# Patient Record
Sex: Female | Born: 1980 | Race: White | Hispanic: No | Marital: Married | Smoking: Never smoker
Health system: Southern US, Community
[De-identification: ages and names within clinical notes are randomized; demographics above are authoritative.]

## PROBLEM LIST (undated history)

## (undated) DIAGNOSIS — D509 Iron deficiency anemia, unspecified: Secondary | ICD-10-CM

## (undated) DIAGNOSIS — T7840XA Allergy, unspecified, initial encounter: Secondary | ICD-10-CM

## (undated) DIAGNOSIS — R011 Cardiac murmur, unspecified: Secondary | ICD-10-CM

## (undated) DIAGNOSIS — D649 Anemia, unspecified: Secondary | ICD-10-CM

## (undated) DIAGNOSIS — E559 Vitamin D deficiency, unspecified: Secondary | ICD-10-CM

## (undated) DIAGNOSIS — B354 Tinea corporis: Secondary | ICD-10-CM

## (undated) DIAGNOSIS — I10 Essential (primary) hypertension: Secondary | ICD-10-CM

## (undated) DIAGNOSIS — K219 Gastro-esophageal reflux disease without esophagitis: Secondary | ICD-10-CM

## (undated) DIAGNOSIS — F419 Anxiety disorder, unspecified: Secondary | ICD-10-CM

## (undated) DIAGNOSIS — J309 Allergic rhinitis, unspecified: Secondary | ICD-10-CM

## (undated) DIAGNOSIS — B019 Varicella without complication: Secondary | ICD-10-CM

## (undated) DIAGNOSIS — R519 Headache, unspecified: Secondary | ICD-10-CM

## (undated) HISTORY — DX: Vitamin D deficiency, unspecified: E55.9

## (undated) HISTORY — DX: Gastro-esophageal reflux disease without esophagitis: K21.9

## (undated) HISTORY — DX: Iron deficiency anemia, unspecified: D50.9

## (undated) HISTORY — DX: Varicella without complication: B01.9

## (undated) HISTORY — DX: Allergic rhinitis, unspecified: J30.9

## (undated) HISTORY — DX: Headache, unspecified: R51.9

## (undated) HISTORY — DX: Allergy, unspecified, initial encounter: T78.40XA

## (undated) HISTORY — PX: BREAST BIOPSY: SHX20

## (undated) HISTORY — DX: Anxiety disorder, unspecified: F41.9

## (undated) HISTORY — DX: Tinea corporis: B35.4

## (undated) HISTORY — DX: Cardiac murmur, unspecified: R01.1

## (undated) HISTORY — DX: Anemia, unspecified: D64.9

## (undated) HISTORY — PX: WISDOM TOOTH EXTRACTION: SHX21

## (undated) HISTORY — DX: Essential (primary) hypertension: I10

---

## 2008-11-18 DIAGNOSIS — K921 Melena: Secondary | ICD-10-CM

## 2008-11-18 HISTORY — DX: Melena: K92.1

## 2008-11-18 LAB — HM COLONOSCOPY

## 2013-11-18 DIAGNOSIS — N926 Irregular menstruation, unspecified: Secondary | ICD-10-CM

## 2013-11-18 HISTORY — DX: Irregular menstruation, unspecified: N92.6

## 2014-06-15 ENCOUNTER — Other Ambulatory Visit: Payer: Self-pay | Admitting: Family Medicine

## 2014-06-15 DIAGNOSIS — Z975 Presence of (intrauterine) contraceptive device: Secondary | ICD-10-CM

## 2014-06-17 ENCOUNTER — Ambulatory Visit
Admission: RE | Admit: 2014-06-17 | Discharge: 2014-06-17 | Disposition: A | Discharge status: Home / Self Care | Payer: Federal, State, Local not specified - PPO | Source: Ambulatory Visit | Attending: Family Medicine | Admitting: Family Medicine

## 2014-06-17 DIAGNOSIS — Z975 Presence of (intrauterine) contraceptive device: Secondary | ICD-10-CM

## 2014-06-28 LAB — HM PAP SMEAR

## 2014-11-18 DIAGNOSIS — J358 Other chronic diseases of tonsils and adenoids: Secondary | ICD-10-CM

## 2014-11-18 DIAGNOSIS — M179 Osteoarthritis of knee, unspecified: Secondary | ICD-10-CM

## 2014-11-18 DIAGNOSIS — M1710 Unilateral primary osteoarthritis, unspecified knee: Secondary | ICD-10-CM

## 2014-11-18 HISTORY — DX: Other chronic diseases of tonsils and adenoids: J35.8

## 2014-11-18 HISTORY — DX: Unilateral primary osteoarthritis, unspecified knee: M17.10

## 2014-11-18 HISTORY — DX: Osteoarthritis of knee, unspecified: M17.9

## 2015-04-12 ENCOUNTER — Other Ambulatory Visit: Payer: Self-pay | Admitting: Family Medicine

## 2015-04-12 DIAGNOSIS — E041 Nontoxic single thyroid nodule: Secondary | ICD-10-CM

## 2015-04-19 DIAGNOSIS — M222X9 Patellofemoral disorders, unspecified knee: Secondary | ICD-10-CM

## 2015-04-19 HISTORY — DX: Patellofemoral disorders, unspecified knee: M22.2X9

## 2015-05-01 ENCOUNTER — Ambulatory Visit
Admission: RE | Admit: 2015-05-01 | Discharge: 2015-05-01 | Disposition: A | Discharge status: Home / Self Care | Payer: Federal, State, Local not specified - PPO | Source: Ambulatory Visit | Attending: Family Medicine | Admitting: Family Medicine

## 2015-05-01 DIAGNOSIS — E041 Nontoxic single thyroid nodule: Secondary | ICD-10-CM

## 2015-08-11 ENCOUNTER — Other Ambulatory Visit: Payer: Self-pay | Admitting: Family Medicine

## 2015-08-11 DIAGNOSIS — N6320 Unspecified lump in the left breast, unspecified quadrant: Secondary | ICD-10-CM

## 2015-08-11 LAB — HM PAP SMEAR

## 2015-08-14 ENCOUNTER — Other Ambulatory Visit: Payer: Self-pay | Admitting: Family Medicine

## 2015-08-14 DIAGNOSIS — N6320 Unspecified lump in the left breast, unspecified quadrant: Secondary | ICD-10-CM

## 2015-08-23 ENCOUNTER — Ambulatory Visit
Admission: RE | Admit: 2015-08-23 | Discharge: 2015-08-23 | Disposition: A | Discharge status: Home / Self Care | Payer: Federal, State, Local not specified - PPO | Source: Ambulatory Visit | Attending: Family Medicine | Admitting: Family Medicine

## 2015-08-23 DIAGNOSIS — N6320 Unspecified lump in the left breast, unspecified quadrant: Secondary | ICD-10-CM

## 2016-01-22 ENCOUNTER — Other Ambulatory Visit: Payer: Self-pay | Admitting: Family Medicine

## 2016-01-22 DIAGNOSIS — N6320 Unspecified lump in the left breast, unspecified quadrant: Secondary | ICD-10-CM

## 2016-01-22 DIAGNOSIS — N63 Unspecified lump in unspecified breast: Secondary | ICD-10-CM

## 2016-02-23 ENCOUNTER — Ambulatory Visit
Admission: RE | Admit: 2016-02-23 | Discharge: 2016-02-23 | Disposition: A | Discharge status: Home / Self Care | Payer: Federal, State, Local not specified - PPO | Source: Ambulatory Visit | Attending: Family Medicine | Admitting: Family Medicine

## 2016-02-23 DIAGNOSIS — N63 Unspecified lump in unspecified breast: Secondary | ICD-10-CM

## 2016-02-23 DIAGNOSIS — N6320 Unspecified lump in the left breast, unspecified quadrant: Secondary | ICD-10-CM

## 2016-08-13 DIAGNOSIS — Z136 Encounter for screening for cardiovascular disorders: Secondary | ICD-10-CM | POA: Diagnosis not present

## 2016-08-13 DIAGNOSIS — E559 Vitamin D deficiency, unspecified: Secondary | ICD-10-CM | POA: Diagnosis not present

## 2016-08-13 DIAGNOSIS — Z0000 Encounter for general adult medical examination without abnormal findings: Secondary | ICD-10-CM | POA: Diagnosis not present

## 2016-08-15 DIAGNOSIS — Z23 Encounter for immunization: Secondary | ICD-10-CM | POA: Diagnosis not present

## 2016-08-15 DIAGNOSIS — Z0000 Encounter for general adult medical examination without abnormal findings: Secondary | ICD-10-CM | POA: Diagnosis not present

## 2016-08-18 HISTORY — PX: BREAST BIOPSY: SHX20

## 2016-08-28 ENCOUNTER — Other Ambulatory Visit: Payer: Self-pay | Admitting: Family Medicine

## 2016-08-28 DIAGNOSIS — N6320 Unspecified lump in the left breast, unspecified quadrant: Secondary | ICD-10-CM

## 2016-08-28 DIAGNOSIS — R921 Mammographic calcification found on diagnostic imaging of breast: Secondary | ICD-10-CM

## 2016-09-06 ENCOUNTER — Other Ambulatory Visit: Payer: Self-pay | Admitting: Family Medicine

## 2016-09-06 ENCOUNTER — Ambulatory Visit
Admission: RE | Admit: 2016-09-06 | Discharge: 2016-09-06 | Disposition: A | Discharge status: Home / Self Care | Payer: Federal, State, Local not specified - PPO | Source: Ambulatory Visit | Attending: Family Medicine | Admitting: Family Medicine

## 2016-09-06 DIAGNOSIS — N6320 Unspecified lump in the left breast, unspecified quadrant: Secondary | ICD-10-CM

## 2016-09-06 DIAGNOSIS — R921 Mammographic calcification found on diagnostic imaging of breast: Secondary | ICD-10-CM

## 2016-09-09 ENCOUNTER — Ambulatory Visit
Admission: RE | Admit: 2016-09-09 | Discharge: 2016-09-09 | Disposition: A | Discharge status: Home / Self Care | Payer: Federal, State, Local not specified - PPO | Source: Ambulatory Visit | Attending: Family Medicine | Admitting: Family Medicine

## 2016-09-09 ENCOUNTER — Other Ambulatory Visit: Payer: Self-pay | Admitting: Family Medicine

## 2016-09-09 DIAGNOSIS — N6320 Unspecified lump in the left breast, unspecified quadrant: Secondary | ICD-10-CM

## 2016-09-09 DIAGNOSIS — D242 Benign neoplasm of left breast: Secondary | ICD-10-CM | POA: Diagnosis not present

## 2016-11-28 DIAGNOSIS — K080 Exfoliation of teeth due to systemic causes: Secondary | ICD-10-CM | POA: Diagnosis not present

## 2017-02-11 DIAGNOSIS — Z23 Encounter for immunization: Secondary | ICD-10-CM | POA: Diagnosis not present

## 2017-05-29 DIAGNOSIS — K080 Exfoliation of teeth due to systemic causes: Secondary | ICD-10-CM | POA: Diagnosis not present

## 2017-07-22 ENCOUNTER — Other Ambulatory Visit: Payer: Self-pay | Admitting: Family Medicine

## 2017-07-22 DIAGNOSIS — R921 Mammographic calcification found on diagnostic imaging of breast: Secondary | ICD-10-CM

## 2017-08-12 DIAGNOSIS — Z0000 Encounter for general adult medical examination without abnormal findings: Secondary | ICD-10-CM | POA: Diagnosis not present

## 2017-08-12 DIAGNOSIS — E559 Vitamin D deficiency, unspecified: Secondary | ICD-10-CM | POA: Diagnosis not present

## 2017-08-12 DIAGNOSIS — Z1329 Encounter for screening for other suspected endocrine disorder: Secondary | ICD-10-CM | POA: Diagnosis not present

## 2017-08-12 DIAGNOSIS — Z114 Encounter for screening for human immunodeficiency virus [HIV]: Secondary | ICD-10-CM | POA: Diagnosis not present

## 2017-08-12 DIAGNOSIS — Z13220 Encounter for screening for lipoid disorders: Secondary | ICD-10-CM | POA: Diagnosis not present

## 2017-08-14 DIAGNOSIS — Z0000 Encounter for general adult medical examination without abnormal findings: Secondary | ICD-10-CM | POA: Diagnosis not present

## 2017-08-14 DIAGNOSIS — D249 Benign neoplasm of unspecified breast: Secondary | ICD-10-CM

## 2017-08-14 DIAGNOSIS — Z23 Encounter for immunization: Secondary | ICD-10-CM | POA: Diagnosis not present

## 2017-08-14 DIAGNOSIS — Z6832 Body mass index (BMI) 32.0-32.9, adult: Secondary | ICD-10-CM | POA: Diagnosis not present

## 2017-08-14 HISTORY — DX: Benign neoplasm of unspecified breast: D24.9

## 2017-08-15 DIAGNOSIS — N760 Acute vaginitis: Secondary | ICD-10-CM | POA: Diagnosis not present

## 2017-08-15 DIAGNOSIS — Z118 Encounter for screening for other infectious and parasitic diseases: Secondary | ICD-10-CM | POA: Diagnosis not present

## 2017-08-15 DIAGNOSIS — Z01419 Encounter for gynecological examination (general) (routine) without abnormal findings: Secondary | ICD-10-CM | POA: Diagnosis not present

## 2017-09-08 ENCOUNTER — Ambulatory Visit
Admission: RE | Admit: 2017-09-08 | Discharge: 2017-09-08 | Disposition: A | Discharge status: Home / Self Care | Payer: Federal, State, Local not specified - PPO | Source: Ambulatory Visit | Attending: Family Medicine | Admitting: Family Medicine

## 2017-09-08 ENCOUNTER — Encounter: Payer: Self-pay | Admitting: Radiology

## 2017-09-08 DIAGNOSIS — R921 Mammographic calcification found on diagnostic imaging of breast: Secondary | ICD-10-CM

## 2017-12-10 DIAGNOSIS — K080 Exfoliation of teeth due to systemic causes: Secondary | ICD-10-CM | POA: Diagnosis not present

## 2018-05-18 DIAGNOSIS — Z6832 Body mass index (BMI) 32.0-32.9, adult: Secondary | ICD-10-CM | POA: Diagnosis not present

## 2018-05-18 DIAGNOSIS — I10 Essential (primary) hypertension: Secondary | ICD-10-CM | POA: Diagnosis not present

## 2018-06-11 DIAGNOSIS — K080 Exfoliation of teeth due to systemic causes: Secondary | ICD-10-CM | POA: Diagnosis not present

## 2018-06-17 DIAGNOSIS — D179 Benign lipomatous neoplasm, unspecified: Secondary | ICD-10-CM | POA: Diagnosis not present

## 2018-06-17 DIAGNOSIS — I10 Essential (primary) hypertension: Secondary | ICD-10-CM | POA: Diagnosis not present

## 2018-06-17 DIAGNOSIS — Z6832 Body mass index (BMI) 32.0-32.9, adult: Secondary | ICD-10-CM | POA: Diagnosis not present

## 2018-08-17 DIAGNOSIS — Z0000 Encounter for general adult medical examination without abnormal findings: Secondary | ICD-10-CM | POA: Diagnosis not present

## 2018-08-17 DIAGNOSIS — Z13220 Encounter for screening for lipoid disorders: Secondary | ICD-10-CM | POA: Diagnosis not present

## 2018-08-17 LAB — LIPID PANEL
Cholesterol: 176 (ref 0–200)
HDL: 48 (ref 35–70)
LDL Cholesterol: 98
Triglycerides: 154 (ref 40–160)

## 2018-08-18 LAB — TSH: TSH: 1.41 (ref 0.41–5.90)

## 2018-08-18 LAB — CBC AND DIFFERENTIAL: Hemoglobin: 11.7 — AB (ref 12.0–16.0)

## 2018-08-18 LAB — BASIC METABOLIC PANEL: Creatinine: 0.8 (ref 0.5–1.1)

## 2018-08-24 DIAGNOSIS — M542 Cervicalgia: Secondary | ICD-10-CM | POA: Diagnosis not present

## 2018-08-24 DIAGNOSIS — G8929 Other chronic pain: Secondary | ICD-10-CM | POA: Diagnosis not present

## 2018-08-24 DIAGNOSIS — Z6831 Body mass index (BMI) 31.0-31.9, adult: Secondary | ICD-10-CM | POA: Diagnosis not present

## 2018-08-24 DIAGNOSIS — Z0000 Encounter for general adult medical examination without abnormal findings: Secondary | ICD-10-CM | POA: Diagnosis not present

## 2018-08-24 DIAGNOSIS — Z23 Encounter for immunization: Secondary | ICD-10-CM | POA: Diagnosis not present

## 2018-11-30 ENCOUNTER — Other Ambulatory Visit: Payer: Self-pay | Admitting: Family Medicine

## 2018-11-30 DIAGNOSIS — Z1231 Encounter for screening mammogram for malignant neoplasm of breast: Secondary | ICD-10-CM

## 2018-12-01 DIAGNOSIS — M542 Cervicalgia: Secondary | ICD-10-CM | POA: Diagnosis not present

## 2018-12-01 DIAGNOSIS — Z6831 Body mass index (BMI) 31.0-31.9, adult: Secondary | ICD-10-CM | POA: Diagnosis not present

## 2018-12-01 DIAGNOSIS — G8929 Other chronic pain: Secondary | ICD-10-CM | POA: Diagnosis not present

## 2018-12-01 DIAGNOSIS — I10 Essential (primary) hypertension: Secondary | ICD-10-CM | POA: Diagnosis not present

## 2018-12-28 ENCOUNTER — Ambulatory Visit
Admission: RE | Admit: 2018-12-28 | Discharge: 2018-12-28 | Disposition: A | Discharge status: Home / Self Care | Payer: Federal, State, Local not specified - PPO | Source: Ambulatory Visit | Attending: Family Medicine | Admitting: Family Medicine

## 2018-12-28 ENCOUNTER — Ambulatory Visit: Payer: Federal, State, Local not specified - PPO

## 2018-12-28 DIAGNOSIS — Z1231 Encounter for screening mammogram for malignant neoplasm of breast: Secondary | ICD-10-CM | POA: Diagnosis not present

## 2018-12-29 DIAGNOSIS — I10 Essential (primary) hypertension: Secondary | ICD-10-CM | POA: Diagnosis not present

## 2018-12-29 DIAGNOSIS — E539 Vitamin B deficiency, unspecified: Secondary | ICD-10-CM | POA: Diagnosis not present

## 2019-01-02 LAB — TSH: TSH: 1.28 (ref <=5.90)

## 2019-02-18 DIAGNOSIS — Z719 Counseling, unspecified: Secondary | ICD-10-CM | POA: Diagnosis not present

## 2019-02-18 DIAGNOSIS — E559 Vitamin D deficiency, unspecified: Secondary | ICD-10-CM | POA: Diagnosis not present

## 2019-02-18 DIAGNOSIS — I10 Essential (primary) hypertension: Secondary | ICD-10-CM | POA: Diagnosis not present

## 2019-02-18 DIAGNOSIS — J309 Allergic rhinitis, unspecified: Secondary | ICD-10-CM | POA: Diagnosis not present

## 2019-08-30 DIAGNOSIS — Z0000 Encounter for general adult medical examination without abnormal findings: Secondary | ICD-10-CM | POA: Diagnosis not present

## 2019-08-30 DIAGNOSIS — E782 Mixed hyperlipidemia: Secondary | ICD-10-CM | POA: Diagnosis not present

## 2019-08-31 LAB — BASIC METABOLIC PANEL: Creatinine: 0.8 (ref 0.5–1.1)

## 2019-08-31 LAB — LIPID PANEL
Cholesterol: 206 — AB (ref 0–200)
HDL: 49 (ref 35–70)
LDL Cholesterol: 120
Triglycerides: 212 — AB (ref 40–160)

## 2019-08-31 LAB — HEPATIC FUNCTION PANEL
ALT: 29 (ref 7–35)
AST: 26 (ref 13–35)
Bilirubin, Total: 0.2

## 2019-08-31 LAB — CBC AND DIFFERENTIAL: Hemoglobin: 11.7 — AB (ref 12.0–16.0)

## 2019-09-07 DIAGNOSIS — Z0000 Encounter for general adult medical examination without abnormal findings: Secondary | ICD-10-CM | POA: Diagnosis not present

## 2019-09-07 DIAGNOSIS — Z23 Encounter for immunization: Secondary | ICD-10-CM | POA: Diagnosis not present

## 2019-09-07 DIAGNOSIS — B354 Tinea corporis: Secondary | ICD-10-CM | POA: Diagnosis not present

## 2019-09-07 DIAGNOSIS — L989 Disorder of the skin and subcutaneous tissue, unspecified: Secondary | ICD-10-CM | POA: Diagnosis not present

## 2019-10-11 DIAGNOSIS — D485 Neoplasm of uncertain behavior of skin: Secondary | ICD-10-CM | POA: Diagnosis not present

## 2019-10-11 DIAGNOSIS — L816 Other disorders of diminished melanin formation: Secondary | ICD-10-CM | POA: Diagnosis not present

## 2019-10-11 DIAGNOSIS — D2339 Other benign neoplasm of skin of other parts of face: Secondary | ICD-10-CM | POA: Diagnosis not present

## 2019-10-11 DIAGNOSIS — L821 Other seborrheic keratosis: Secondary | ICD-10-CM | POA: Diagnosis not present

## 2019-10-11 DIAGNOSIS — D225 Melanocytic nevi of trunk: Secondary | ICD-10-CM | POA: Diagnosis not present

## 2019-12-03 DIAGNOSIS — I10 Essential (primary) hypertension: Secondary | ICD-10-CM | POA: Diagnosis not present

## 2019-12-03 DIAGNOSIS — Q825 Congenital non-neoplastic nevus: Secondary | ICD-10-CM | POA: Diagnosis not present

## 2019-12-03 DIAGNOSIS — J309 Allergic rhinitis, unspecified: Secondary | ICD-10-CM | POA: Diagnosis not present

## 2020-01-03 ENCOUNTER — Other Ambulatory Visit: Payer: Self-pay

## 2020-01-03 ENCOUNTER — Other Ambulatory Visit: Payer: Self-pay | Admitting: Family Medicine

## 2020-01-03 ENCOUNTER — Ambulatory Visit
Admission: RE | Admit: 2020-01-03 | Discharge: 2020-01-03 | Disposition: A | Discharge status: Home / Self Care | Payer: Federal, State, Local not specified - PPO | Source: Ambulatory Visit

## 2020-01-03 DIAGNOSIS — Z1231 Encounter for screening mammogram for malignant neoplasm of breast: Secondary | ICD-10-CM

## 2020-01-04 ENCOUNTER — Other Ambulatory Visit: Payer: Self-pay | Admitting: Family Medicine

## 2020-01-04 DIAGNOSIS — N6310 Unspecified lump in the right breast, unspecified quadrant: Secondary | ICD-10-CM

## 2020-01-05 ENCOUNTER — Ambulatory Visit
Admission: RE | Admit: 2020-01-05 | Discharge: 2020-01-05 | Disposition: A | Discharge status: Home / Self Care | Payer: Federal, State, Local not specified - PPO | Source: Ambulatory Visit | Attending: Family Medicine | Admitting: Family Medicine

## 2020-01-05 ENCOUNTER — Other Ambulatory Visit: Payer: Self-pay

## 2020-01-05 DIAGNOSIS — R922 Inconclusive mammogram: Secondary | ICD-10-CM | POA: Diagnosis not present

## 2020-01-05 DIAGNOSIS — N6310 Unspecified lump in the right breast, unspecified quadrant: Secondary | ICD-10-CM

## 2020-01-05 DIAGNOSIS — N6311 Unspecified lump in the right breast, upper outer quadrant: Secondary | ICD-10-CM | POA: Diagnosis not present

## 2020-01-06 IMAGING — MG DIGITAL SCREENING BILATERAL MAMMOGRAM WITH TOMO AND CAD
8 series · 8 of 24 positions shown · non-contrast
Comparison: Previous exam(s).

CLINICAL DATA: Screening.

EXAM:
DIGITAL SCREENING BILATERAL MAMMOGRAM WITH TOMO AND CAD

[R CC synth-2D]
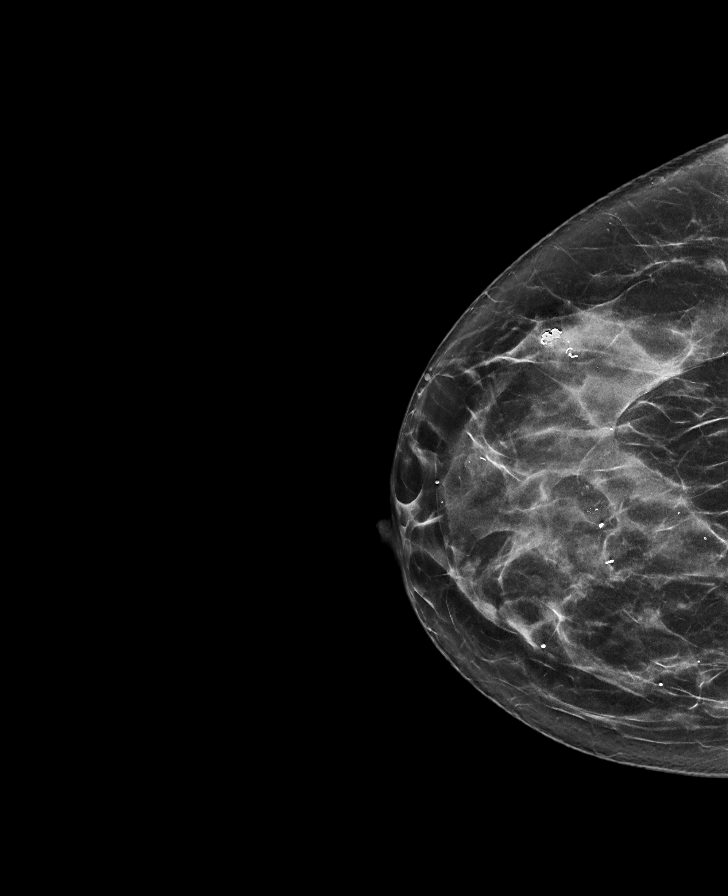

[R MLO synth-2D]
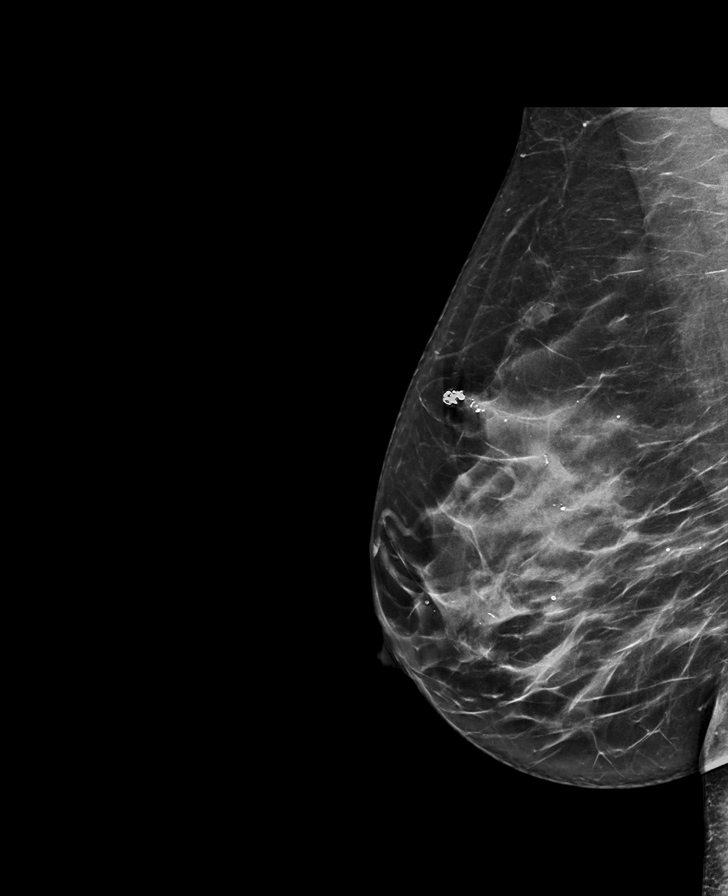

[L CC synth-2D]
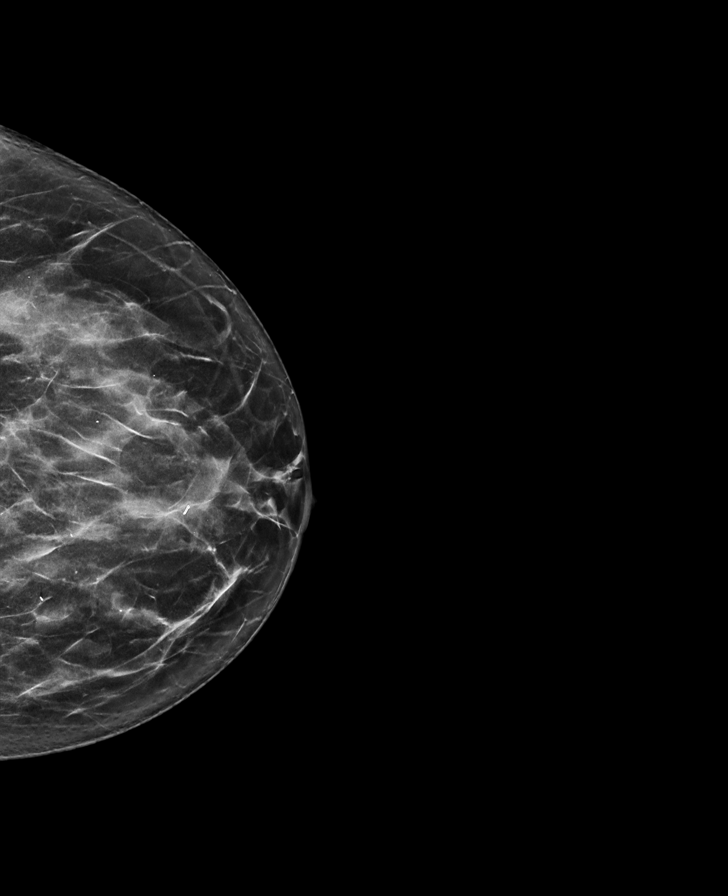

[L MLO synth-2D]
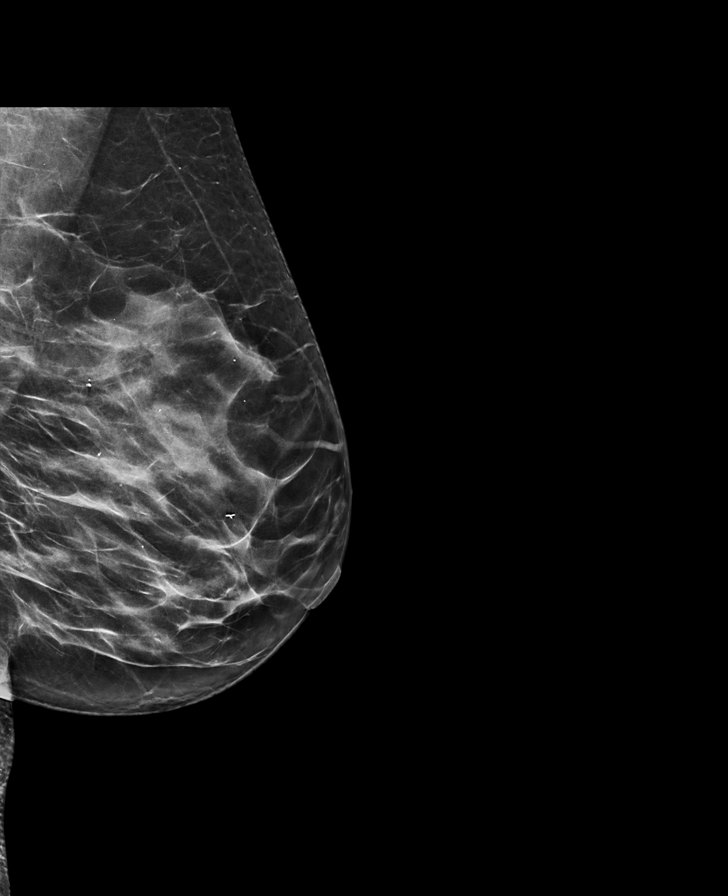

[L CC tomo · tomo slice 34/67.0]
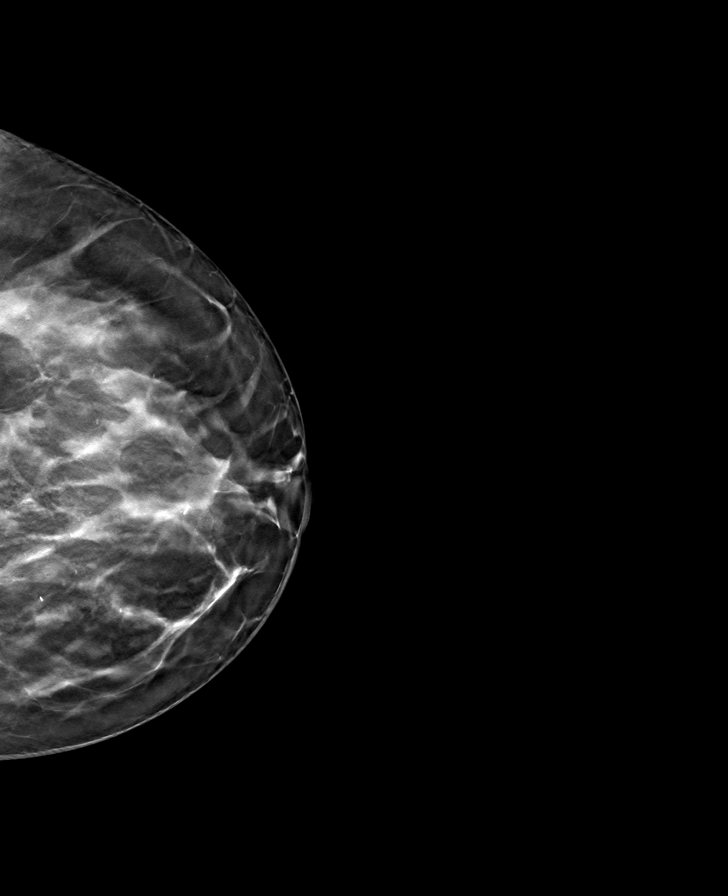

[R CC tomo · tomo slice 35/70.0]
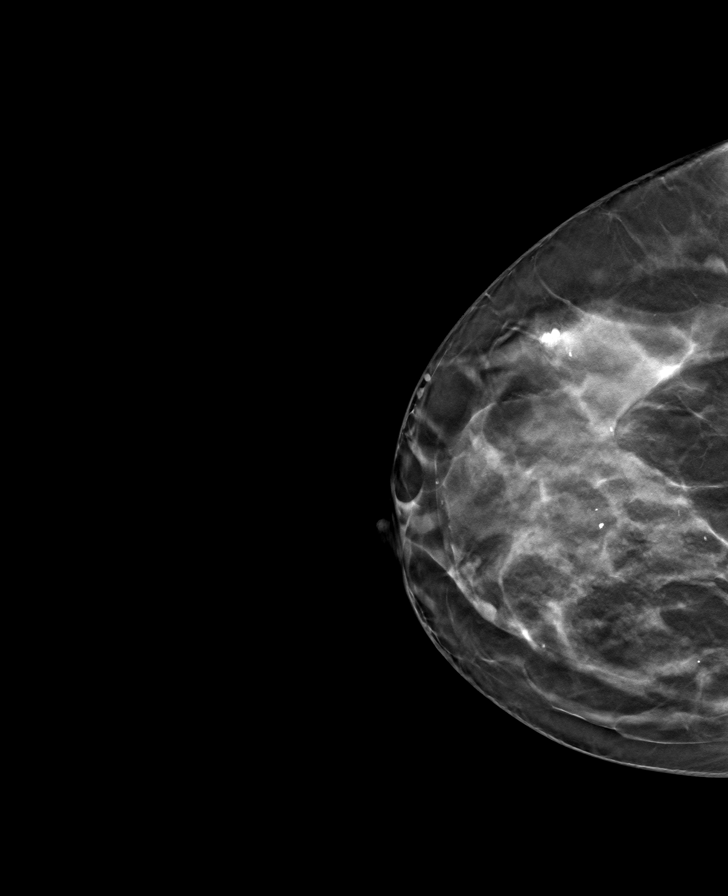

[R MLO tomo · tomo slice 39/78.0]
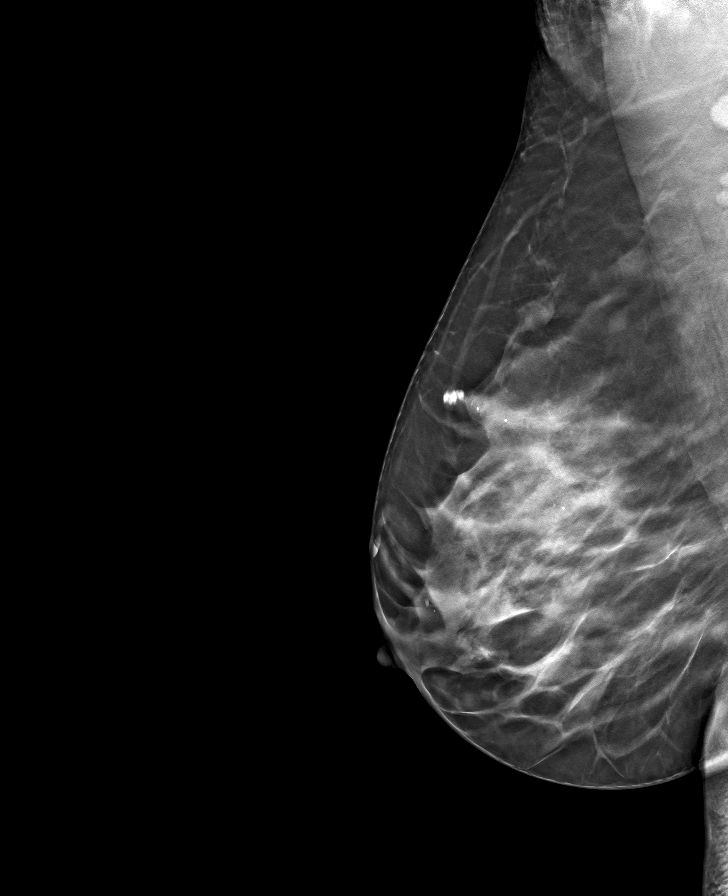

[L MLO tomo · tomo slice 38/75.0]
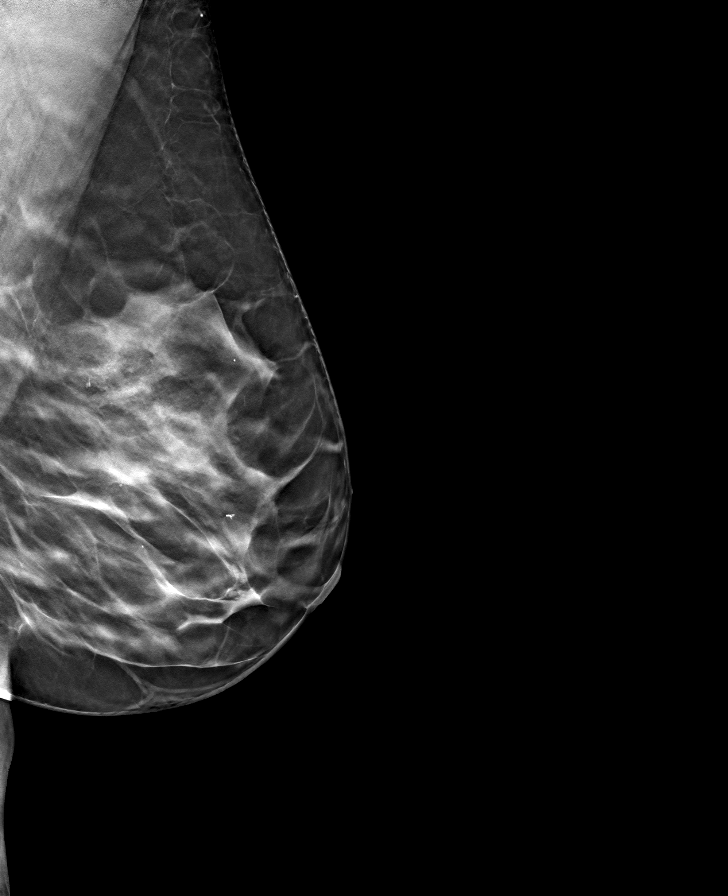

[8 of 24 positions shown; findings below may reference images not displayed]

ACR Breast Density Category c: The breast tissue is heterogeneously
dense, which may obscure small masses.
FINDINGS: There are no findings suspicious for malignancy. Images were
processed with CAD.
IMPRESSION: No mammographic evidence of malignancy. A result letter of this
screening mammogram will be mailed directly to the patient.

RECOMMENDATION:
Screening mammogram in one year. (Code:FT-U-LHB)

BI-RADS CATEGORY  1: Negative.

## 2020-02-21 ENCOUNTER — Encounter: Payer: Self-pay | Admitting: Family Medicine

## 2020-02-22 ENCOUNTER — Other Ambulatory Visit: Payer: Self-pay

## 2020-02-22 ENCOUNTER — Encounter: Payer: Self-pay | Admitting: Family Medicine

## 2020-02-22 ENCOUNTER — Ambulatory Visit: Payer: Federal, State, Local not specified - PPO | Admitting: Family Medicine

## 2020-02-22 VITALS — BP 122/84 | HR 80 | Temp 98.2°F | Resp 18 | Ht 61.5 in | Wt 171.2 lb

## 2020-02-22 DIAGNOSIS — Z7689 Persons encountering health services in other specified circumstances: Secondary | ICD-10-CM

## 2020-02-22 DIAGNOSIS — I10 Essential (primary) hypertension: Secondary | ICD-10-CM | POA: Diagnosis not present

## 2020-02-22 DIAGNOSIS — E669 Obesity, unspecified: Secondary | ICD-10-CM

## 2020-02-22 DIAGNOSIS — R202 Paresthesia of skin: Secondary | ICD-10-CM

## 2020-02-22 DIAGNOSIS — T7840XA Allergy, unspecified, initial encounter: Secondary | ICD-10-CM

## 2020-02-22 DIAGNOSIS — J302 Other seasonal allergic rhinitis: Secondary | ICD-10-CM

## 2020-02-22 DIAGNOSIS — R200 Anesthesia of skin: Secondary | ICD-10-CM

## 2020-02-22 MED ORDER — AMLODIPINE BESYLATE 10 MG PO TABS: 10.0000 mg | ORAL_TABLET | Freq: Every day | ORAL | 1 refills | Status: DC

## 2020-02-22 NOTE — Progress Notes (Signed)
Patient ID: Maria Goodwin, female  DOB: 10-27-1981, 39 y.o.   MRN: [id number] Patient Care Team    Relationship Specialty Notifications Start End  [name], DO PCP - General Family Medicine  02/22/20     Chief Complaint  Patient presents with  . Establish Care    Last PCP Dr Ernie Hew.   . Hypertension    Pt needs refill on medication     Subjective:  Maria Goodwin is a 39 y.o.  female present for new patient establishment. All past medical history, surgical history, allergies, family history, immunizations, medications and social history were updated in the electronic medical record today. All recent labs, ED visits and hospitalizations within the last year were reviewed.  Hypertension/obesity:  Pt reports compliance with amlodipine 10 mg daily. Blood pressures ranges at home routinely checked. Patient denies chest pain, shortness of breath or lower extremity edema. Pt is not taking a daily baby ASA. Pt is not prescribed a prescribed statin. BMP: 08/30/2019 within normal limits CBC: " TSH: 08/30/2019 within normal limits Lipids: 08/30/2019 RF: Hypertension, obesity, family history of hypertension, hyperlipidemia and stroke  Seasonal allergies/Allergic reaction, initial encounter Patient endorses having seasonal allergies yearly.  The end of last year she discussed with her primary care physician that she was having some mild shortness of breath.  Patient admits she did not know if it was allergies and even consider anxiety as a possible cause.  At that time she was started on Singulair.  Patient reports she was not taking an antihistamine or using any format of nasal sprays.  She states the Singulair might have helped a little, but she is uncertain and she has not taking this medication for a few months  Numbness and tingling of upper extremity Patient reports numbness and tingling of bilateral upper extremities on occasions upon awakening.  She reports she does sleep with her  arms folded underneath her routinely.  She endorses numbness and tingling resolves within a few minutes of awakening.  Depression screen [facility] 2/9 02/22/2020  Decreased Interest 0  Down, Depressed, Hopeless 0  PHQ - 2 Score 0   No flowsheet data found.     No flowsheet data found.  Immunization History  Administered Date(s) Administered  . Hepatitis A 08/15/2016, 02/11/2017  . Hepatitis B 01/31/1987  . Influenza-Unspecified 09/07/2019  . PFIZER SARS-COV-2 Vaccination 02/08/2020  . Tdap 08/15/2016    No exam data present  Past Medical History:  Diagnosis Date  . Allergy   . Blood in stool 2010   Patient reports hospitalization for blood in stool, diarrhea and extreme dehydration.  She was hospitalized at this time and a colonoscopy was completed.  . Chicken pox   . Frequent headaches   . GERD (gastroesophageal reflux disease)   . Heart murmur   . Hypertension    No Known Allergies Past Surgical History:  Procedure Laterality Date  . 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 CESAREAN SECTION  2012  . WISDOM TOOTH EXTRACTION     Family History  Problem Relation Age of Onset  . Arthritis Mother   . Hyperlipidemia Mother   . Hypertension Mother   . Learning disabilities Mother   . Alcohol abuse Father   . Depression Father   . Asthma Brother   . Hyperlipidemia Brother   . Hypertension Brother   . Bladder Cancer Maternal Grandmother   . Early death Maternal Grandmother   . Skin cancer Maternal Grandfather   .  Early death Maternal Grandfather   . Stroke Maternal Grandfather   . Hypertension Maternal Grandfather   . Skin cancer Paternal Grandmother   . Skin cancer Paternal Grandfather   . Asthma Brother   . Hyperlipidemia Brother   . Hypertension Brother   . Breast cancer Neg Hx    Social History   Social History Narrative   Marital status/children/pets: Married, 1 child.   Education/employment: High school graduate, stay-at-home mom   Safety:       -Wears a bicycle helmet riding a bike: Yes     -smoke alarm in the home:Yes     - wears seatbelt: Yes     - Feels safe in their relationships: Yes    Allergies as of 02/22/2020   No Known Allergies     Medication List       Accurate as of February 22, 2020 11:59 PM. If you have any questions, ask your nurse or doctor.        STOP taking these medications   montelukast 10 MG tablet Commonly known as: SINGULAIR Stopped by: Howard Pouch, DO   nystatin cream Commonly known as: MYCOSTATIN Stopped by: Howard Pouch, DO     TAKE these medications   amLODipine 10 MG tablet Commonly known as: NORVASC Take 1 tablet (10 mg total) by mouth daily. What changed: Another medication with the same name was removed. Continue taking this medication, and follow the directions you see here. Changed by: Howard Pouch, DO   MULTI COMPLETE PO Take by mouth.       All past medical history, surgical history, allergies, family history, immunizations andmedications were updated in the EMR today and reviewed under the history and medication portions of their EMR.    No results found for this or any previous visit (from the past 2160 hour(s)).   ROS: 14 pt review of systems performed and negative (unless mentioned in an HPI)  Objective: BP 122/84 (BP Location: Right Arm, Patient Position: Sitting, Cuff Size: Normal)   Pulse 80   Temp 98.2 F (36.8 C) (Temporal)   Resp 18   Ht 5' 1.5" (1.562 m)   Wt 171 lb 4 oz (77.7 kg)   LMP 02/03/2020 (Exact Date)   SpO2 100%   BMI 31.83 kg/m  Gen: Afebrile. No acute distress. Nontoxic in appearance, well-developed, well-nourished, female HENT: AT. [place]. Bilateral TM visualized and normal in appearance, normal external auditory canal.  Bilateral nares within normal limits. Throat without erythema, ulcerations or exudates.  No cough on exam, no hoarseness on exam. Eyes:Pupils Equal Round Reactive to light, Extraocular movements intact,  Conjunctiva without  redness, discharge or icterus. Neck/lymp/endocrine: Supple, no lymphadenopathy, no thyromegaly CV: RRR, no edema, +2/4 P posterior tibialis pulses. Chest: CTAB, no wheeze, rhonchi or crackles.  Normal respiratory effort.  Good air movement. Skin: no rashes, purpura or petechiae. Warm and well-perfused. Skin intact. Neuro/Msk:  Normal gait. PERLA. EOMi. Alert. Oriented x3.  Negative Tinel's bilateral wrist.  Neurovascularly intact distally Psych: Normal affect, dress and demeanor. Normal speech. Normal thought content and judgment.  Assessment/plan: Jadamarie Faine is a 39 y.o. female present for new pt est.  Essential hypertension/Obesity (BMI 30-39.9) Stable. Continue amlodipine 10 mg daily Low-sodium diet Routine exercise Reviewed outside labs dated October/2020 Follow-up for CPE in October  Seasonal allergies Advised patient to start Allegra 24-hour daily antihistamine for seasonal allergies. +/- Flonase nasal spray If allergies are not well controlled with the above daily regimen, then would consider adding back  montelukast for her. Follow-up as needed  Allergic reaction, initial encounter For now, avoidance of cucumbers and watermelon.  Briefly discussed cross-reactivity, allergy groups and potential for worsening allergy symptoms with exposure.  She is agreeable to referral to allergy for allergy testing. Daily Allegra initiated for her seasonal allergies.  However if she experiences allergic reaction would encourage her to keep Benadryl on hand and seek emergent attention if shortness of breath or swelling around the mouth occurs. - Ambulatory referral to Allergy  Numbness and tingling of upper extremity: Ultimately changing her sleeping habits will be needed to completely resolve the issue.   We did discuss stretches for the upper chest wall and arms.  Encouraged her to perform stretches twice daily.  Patient reports understanding.  Follow-up as needed  Return in about 6 months  (around 08/30/2020) for CPE (30 min), [facility] (30 min).  Orders Placed This Encounter  Procedures  . Ambulatory referral to Allergy   Meds ordered this encounter  Medications  . amLODipine (NORVASC) 10 MG tablet    Sig: Take 1 tablet (10 mg total) by mouth daily.    Dispense:  90 tablet    Refill:  1    Referral Orders     Ambulatory referral to Allergy   Note is dictated utilizing voice recognition software. Although note has been proof read prior to signing, occasional typographical errors still can be missed. If any questions arise, please do not hesitate to call for verification.  Electronically signed by: Howard Pouch, DO [place]

## 2020-02-22 NOTE — Patient Instructions (Addendum)
Allegra 24 hour allergy medication.  I have refilled your amlodipine.  Perform the stretches for the thoracic/chest wall, but ultimately it will be changing our sleeping habits.   Follow up at time of your physical> we will call you to schedule.     Please help [place] help you:  We are honored you have chosen [facility] for your Primary Care home. Below you will find basic instructions that you may need to access in the future. Please help [place] help you by reading the instructions, which cover many of the frequent questions we experience.   Prescription refills and request:  -In order to allow more efficient response time, please call your pharmacy for all refills. They will forward the request electronically to [place]. This allows for the quickest possible response. Request left on a nurse line can take longer to refill, since these are checked as time allows between office patients and other phone calls.  - refill request can take up to 3-5 working days to complete.  - If request is sent electronically and request is appropiate, it is usually completed in 1-2 business days.  - all patients will need to be seen routinely for all chronic medical conditions requiring prescription medications (see follow-up below). If you are overdue for follow up on your condition, you will be asked to make an appointment and we will call in enough medication to cover you until your appointment (up to 30 days).  - all controlled substances will require a face to face visit to request/refill.  - if you desire your prescriptions to go through a new pharmacy, and have an active script at original pharmacy, you will need to call your pharmacy and have scripts transferred to new pharmacy. This is completed between the pharmacy locations and not by your provider.    Results: Our office handles many outgoing and incoming calls daily. If we have not contacted you within 1 week about your results, please check your mychart to  see if there is a message first and if not, then contact our office.  In helping with this matter, you help decrease call volume, and therefore allow [place] to be able to respond to patients needs more efficiently.  We will always attempt to call you with results,  normal or abnormal. However, if we are unable to reach you we will send a message in your my chart with results.   Acute office visits (sick visit):  An acute visit is intended for a new problem and are scheduled in shorter time slots to allow schedule openings for patients with new problems. This is the appropriate visit to discuss a new problem. Problems will not be addressed by phone call or Echart message. Appointment is needed if requesting treatment. In order to provide you with excellent quality medical care with proper time for you to explain your problem, have an exam and receive treatment with instructions, these appointments should be limited to one new problem per visit. If you experience a new problem, in which you desire to be addressed, please make an acute office visit, we save openings on the schedule to accommodate you. Please do not save your new problem for any other type of visit, let us take care of it properly and quickly for you.   Follow up visits:  Depending on your condition(s) your provider will need to see you routinely in order to provide you with quality care and prescribe medication(s). Most chronic conditions (Example: hypertension, Diabetes, depression/anxiety.Marland KitchenMarland Kitchen  etc), require visits a couple times a year. Your provider will instruct you on proper follow up for your personal medical conditions and history. Please make certain to make follow up appointments for your condition as instructed. Failing to do so could result in lapse in your medication treatment/refills. If you request a refill, and are overdue to be seen on a condition, we will always provide you with a 30 day script (once) to allow you time to schedule.     Medicare wellness (well visit): - we have a wonderful Nurse Maudie Mercury), that will meet with you and provide you will yearly medicare wellness visits. These visits should occur yearly (can not be scheduled less than 1 calendar year apart) and cover preventive health, immunizations, advance directives and screenings you are entitled to yearly through your medicare benefits. Do not miss out on your entitled benefits, this is when medicare will pay for these benefits to be ordered for you.  These are strongly encouraged by your provider and is the appropriate type of visit to make certain you are up to date with all preventive health benefits. If you have not had your medicare wellness exam in the last 12 months, please make certain to schedule one by calling the office and schedule your medicare wellness with Maudie Mercury as soon as possible.   Yearly physical (well visit):  - Adults are recommended to be seen yearly for physicals. Check with your insurance and date of your last physical, most insurances require one calendar year between physicals. Physicals include all preventive health topics, screenings, medical exam and labs that are appropriate for gender/age and history. You may have fasting labs needed at this visit. This is a well visit (not a sick visit), new problems should not be covered during this visit (see acute visit).  - Pediatric patients are seen more frequently when they are younger. Your provider will advise you on well child visit timing that is appropriate for your their age. - This is not a medicare wellness visit. Medicare wellness exams do not have an exam portion to the visit. Some medicare companies allow for a physical, some do not allow a yearly physical. If your medicare allows a yearly physical you can schedule the medicare wellness with our nurse Maudie Mercury and have your physical with your provider after, on the same day. Please check with insurance for your full benefits.   Late Policy/No  Shows:  - all new patients should arrive 15-30 minutes earlier than appointment to allow [place] time  to  obtain all personal demographics,  insurance information and for you to complete office paperwork. - All established patients should arrive 10-15 minutes earlier than appointment time to update all information and be checked in .  - In our best efforts to run on time, if you are late for your appointment you will be asked to either reschedule or if able, we will work you back into the schedule. There will be a wait time to work you back in the schedule,  depending on availability.  - If you are unable to make it to your appointment as scheduled, please call 24 hours ahead of time to allow [place] to fill the time slot with someone else who needs to be seen. If you do not cancel your appointment ahead of time, you may be charged a no show fee.

## 2020-02-23 ENCOUNTER — Encounter: Payer: Self-pay | Admitting: Family Medicine

## 2020-02-23 DIAGNOSIS — J302 Other seasonal allergic rhinitis: Secondary | ICD-10-CM | POA: Insufficient documentation

## 2020-02-23 DIAGNOSIS — T7840XA Allergy, unspecified, initial encounter: Secondary | ICD-10-CM | POA: Insufficient documentation

## 2020-02-23 DIAGNOSIS — R200 Anesthesia of skin: Secondary | ICD-10-CM

## 2020-02-23 DIAGNOSIS — R202 Paresthesia of skin: Secondary | ICD-10-CM | POA: Insufficient documentation

## 2020-02-23 HISTORY — DX: Anesthesia of skin: R20.2

## 2020-02-23 HISTORY — DX: Anesthesia of skin: R20.0

## 2020-02-23 HISTORY — DX: Allergy, unspecified, initial encounter: T78.40XA

## 2020-02-28 ENCOUNTER — Encounter: Payer: Self-pay | Admitting: Family Medicine

## 2020-03-23 ENCOUNTER — Other Ambulatory Visit: Payer: Self-pay

## 2020-03-23 ENCOUNTER — Ambulatory Visit: Payer: Federal, State, Local not specified - PPO | Admitting: Allergy

## 2020-03-23 ENCOUNTER — Encounter: Payer: Self-pay | Admitting: Allergy

## 2020-03-23 VITALS — BP 130/78 | HR 72 | Temp 97.7°F | Resp 18 | Ht 61.77 in | Wt 170.0 lb

## 2020-03-23 DIAGNOSIS — T781XXD Other adverse food reactions, not elsewhere classified, subsequent encounter: Secondary | ICD-10-CM

## 2020-03-23 DIAGNOSIS — R05 Cough: Secondary | ICD-10-CM | POA: Diagnosis not present

## 2020-03-23 DIAGNOSIS — J3089 Other allergic rhinitis: Secondary | ICD-10-CM | POA: Insufficient documentation

## 2020-03-23 DIAGNOSIS — H1013 Acute atopic conjunctivitis, bilateral: Secondary | ICD-10-CM | POA: Diagnosis not present

## 2020-03-23 DIAGNOSIS — T781XXA Other adverse food reactions, not elsewhere classified, initial encounter: Secondary | ICD-10-CM | POA: Insufficient documentation

## 2020-03-23 DIAGNOSIS — R059 Cough, unspecified: Secondary | ICD-10-CM | POA: Insufficient documentation

## 2020-03-23 HISTORY — DX: Other allergic rhinitis: J30.89

## 2020-03-23 NOTE — Assessment & Plan Note (Signed)
Fresh watermelon and cucumbers cause perioral pruritus.  No issues with pickles.  Noted that garlic, onions and black beans also cause some upset stomach at times.  Today's skin testing showed: Negative to beans, onions, cucumber, watermelon, garlic.  Continue to avoid foods that are bothersome - fresh watermelon, fresh cucumber.  Limit onion, garlic, bean intake.   Discussed that her food triggered oral and throat symptoms are likely caused by oral food allergy syndrome (OFAS). This is caused by cross reactivity of pollen with fresh fruits and vegetables, and nuts. Symptoms are usually localized in the form of itching and burning in mouth and throat. Very rarely it can progress to more severe symptoms. Eating foods in cooked or processed forms usually minimizes symptoms. I recommended avoidance of eating the problem foods, especially during the peak season(s). Sometimes, OFAS can induce severe throat swelling or even a systemic reaction; with such instance, I advised them to report to a local ER. A list of common pollens and food cross-reactivities was provided to the patient.

## 2020-03-23 NOTE — Assessment & Plan Note (Signed)
Patient also noticed some coughing and chest congestion.  No prior history of asthma or albuterol use.  Today's spirometry was normal.  Most likely symptoms are from post nasal drip.   Monitor symptoms - if developing wheezing, chest tightness, trouble breathing then will consider starting inhalers.

## 2020-03-23 NOTE — Assessment & Plan Note (Signed)
Rhinoconjunctivitis symptoms for the past 30+ years but worse since moved to [place] 6 years ago.  Symptoms flare during the spring and fall.  Currently on Allegra with good benefit.  Tried Benadryl, Zyrtec and Claritin as well in the past.  No previous allergy testing or ENT evaluation.  Today's skin testing showed: Positive to dust mites, weed, trees, mold, and cockroach. Borderline to grass pollen. Negative to [place] pig.   Start environmental control measures.   Continue Allegra 180mg  daily.   Declines any nasal spray or eyedrops at this time.  Discussed starting allergy immunotherapy if above regimen does not control symptoms.  Handout given.

## 2020-03-23 NOTE — Patient Instructions (Addendum)
Today's skin testing showed: Positive to dust mites, weed, trees, mold, and cockroach. Borderline to grass pollen.  Negative to beans, onions, cucumber, watermelon, garlic, [place] pig.   You breathing test was normal.  If you notice wheezing, chest tightness, trouble breathing then let us know.   Environmental allergies:  Start environmental control measures.   Continue Allegra 180mg  daily.   Read about allergy injections.   Food:  Continue to avoid foods that bother you - fresh watermelon, fresh cucumber.  Limit onion, garlic, bean intake.   Discussed that her food triggered oral and throat symptoms are likely caused by oral food allergy syndrome (OFAS). This is caused by cross reactivity of pollen with fresh fruits and vegetables, and nuts. Symptoms are usually localized in the form of itching and burning in mouth and throat. Very rarely it can progress to more severe symptoms. Eating foods in cooked or processed forms usually minimizes symptoms. I recommended avoidance of eating the problem foods, especially during the peak season(s). Sometimes, OFAS can induce severe throat swelling or even a systemic reaction; with such instance, I advised them to report to a local ER. A list of common pollens and food cross-reactivities was provided to the patient.   Follow up as needed.   Control of House Dust Mite Allergen . Dust mite allergens are a common trigger of allergy and asthma symptoms. While they can be found throughout the house, these microscopic creatures thrive in warm, humid environments such as bedding, upholstered furniture and carpeting. . Because so much time is spent in the bedroom, it is essential to reduce mite levels there.  . Encase pillows, mattresses, and box springs in special allergen-proof fabric covers or airtight, zippered plastic covers.  . Bedding should be washed weekly in hot water (130 F) and dried in a hot dryer. Allergen-proof covers are available for  comforters and pillows that can't be regularly washed.  Wendee Copp the allergy-proof covers every few months. Minimize clutter in the bedroom. Keep pets out of the bedroom.  Marland Kitchen Keep humidity less than 50% by using a dehumidifier or air conditioning. You can buy a humidity measuring device called a hygrometer to monitor this.  . If possible, replace carpets with hardwood, linoleum, or washable area rugs. If that's not possible, vacuum frequently with a vacuum that has a HEPA filter. . Remove all upholstered furniture and non-washable window drapes from the bedroom. . Remove all non-washable stuffed toys from the bedroom.  Wash stuffed toys weekly.  Reducing Pollen Exposure . Pollen seasons: trees (spring), grass (summer) and ragweed/weeds (fall). Marland Kitchen Keep windows closed in your home and car to lower pollen exposure.  Susa Simmonds air conditioning in the bedroom and throughout the house if possible.  . Avoid going out in dry windy days - especially early morning. . Pollen counts are highest between 5 - 10 AM and on dry, hot and windy days.  . Save outside activities for late afternoon or after a heavy rain, when pollen levels are lower.  . Avoid mowing of grass if you have grass pollen allergy. Marland Kitchen Be aware that pollen can also be transported indoors on people and pets.  . Dry your clothes in an automatic dryer rather than hanging them outside where they might collect pollen.  . Rinse hair and eyes before bedtime.  Mold Control . Mold and fungi can grow on a variety of surfaces provided certain temperature and moisture conditions exist.  . Outdoor molds grow on plants, decaying vegetation and  soil. The major outdoor mold, Alternaria and Cladosporium, are found in very high numbers during hot and dry conditions. Generally, a late summer - fall peak is seen for common outdoor fungal spores. Rain will temporarily lower outdoor mold spore count, but counts rise rapidly when the rainy period ends. . The most  important indoor molds are Aspergillus and Penicillium. Dark, humid and poorly ventilated basements are ideal sites for mold growth. The next most common sites of mold growth are the bathroom and the kitchen. Outdoor (Seasonal) Mold Control . Use air conditioning and keep windows closed. . Avoid exposure to decaying vegetation. Marland Kitchen Avoid leaf raking. . Avoid grain handling. . Consider wearing a face mask if working in moldy areas.  Indoor (Perennial) Mold Control  . Maintain humidity below 50%. . Get rid of mold growth on hard surfaces with water, detergent and, if necessary, 5% bleach (do not mix with other cleaners). Then dry the area completely. If mold covers an area more than 10 square feet, consider hiring an indoor environmental professional. . For clothing, washing with soap and water is best. If moldy items cannot be cleaned and dried, throw them away. . Remove sources e.g. contaminated carpets. . Repair and seal leaking roofs or pipes. Using dehumidifiers in damp basements may be helpful, but empty the water and clean units regularly to prevent mildew from forming. All rooms, especially basements, bathrooms and kitchens, require ventilation and cleaning to deter mold and mildew growth. Avoid carpeting on concrete or damp floors, and storing items in damp areas.  Cockroach Allergen Avoidance Cockroaches are often found in the homes of densely populated urban areas, schools or commercial buildings, but these creatures can lurk almost anywhere. This does not mean that you have a dirty house or living area. . Block all areas where roaches can enter the home. This includes crevices, wall cracks and windows.  . Cockroaches need water to survive, so fix and seal all leaky faucets and pipes. Have an exterminator go through the house when your family and pets are gone to eliminate any remaining roaches. Marland Kitchen Keep food in lidded containers and put pet food dishes away after your pets are done eating.  Vacuum and sweep the floor after meals, and take out garbage and recyclables. Use lidded garbage containers in the kitchen. Wash dishes immediately after use and clean under stoves, refrigerators or toasters where crumbs can accumulate. Wipe off the stove and other kitchen surfaces and cupboards regularly.

## 2020-03-23 NOTE — Assessment & Plan Note (Signed)
   See assessment and plan as above for allergic rhinitis.  

## 2020-03-23 NOTE — Progress Notes (Signed)
New Patient Note  RE: Maria Goodwin MRN: [id number] DOB: 10/28/1981 Date of Office Visit: 03/23/2020  Referring provider: [name], DO Primary care provider: [name], DO  Chief Complaint: Allergic Rhinitis  (Watermelon and Cucumber throat itchy would cough up small pieces)  History of Present Illness: I had the pleasure of seeing Maria Goodwin for initial evaluation at the [facility] on 03/23/2020. She is a 39 y.o. female, who is referred here by Howard Pouch A, DO for the evaluation of allergies.  Rhinitis: She reports symptoms of sneezing, PND, chest congestion, coughing, rhinorrhea, headaches, itchy/watery eyes. Symptoms have been going on for 30+ years but worse since moved to [place] 6 years ago from [place]. The symptoms are present during the spring and fall. Other triggers include exposure to none. Anosmia: no. Headache: yes. She has used allegra, benadryl, zyrtec, Claritin with fair improvement in symptoms. Zyrtec and Benadryl caused some fogginess. Sinus infections: no. Previous work up includes: no. Previous ENT evaluation: no. Previous sinus imaging: no. History of nasal polyps: no. Last eye exam: about 1 year ago. History of reflux: yes and takes tums as needed.  Food: She reports food allergy to watermelon, cucumber. The reaction occurred a few years ago, after she ate fresh watermelon and fresh cucumber. Symptoms started within minutes and was in the form of perioral pruritus. Denies any hives, swelling, wheezing, diarrhea, vomiting.  Garlic, black beans and onions cause some upset stomach - reflux and increased gas.   No issues with pickles.   Past work up includes: none. Dietary History: patient has been eating other foods including milk, eggs, peanut, treenuts, sesame, soy, wheat, fruits and vegetables. Patient is a vegetarian.   Assessment and Plan: Maria Goodwin is a 39 y.o. female with: Other allergic rhinitis Rhinoconjunctivitis  symptoms for the past 30+ years but worse since moved to [place] 6 years ago.  Symptoms flare during the spring and fall.  Currently on Allegra with good benefit.  Tried Benadryl, Zyrtec and Claritin as well in the past.  No previous allergy testing or ENT evaluation.  Today's skin testing showed: Positive to dust mites, weed, trees, mold, and cockroach. Borderline to grass pollen. Negative to [place] pig.   Start environmental control measures.   Continue Allegra 180mg  daily.   Declines any nasal spray or eyedrops at this time.  Discussed starting allergy immunotherapy if above regimen does not control symptoms.  Handout given.  Allergic conjunctivitis of both eyes  See assessment and plan as above for allergic rhinitis.  Coughing Patient also noticed some coughing and chest congestion.  No prior history of asthma or albuterol use.  Today's spirometry was normal.  Most likely symptoms are from post nasal drip.   Monitor symptoms - if developing wheezing, chest tightness, trouble breathing then will consider starting inhalers.   Pollen-food allergy Fresh watermelon and cucumbers cause perioral pruritus.  No issues with pickles.  Noted that garlic, onions and black beans also cause some upset stomach at times.  Today's skin testing showed: Negative to beans, onions, cucumber, watermelon, garlic.  Continue to avoid foods that are bothersome - fresh watermelon, fresh cucumber.  Limit onion, garlic, bean intake.   Discussed that her food triggered oral and throat symptoms are likely caused by oral food allergy syndrome (OFAS). This is caused by cross reactivity of pollen with fresh fruits and vegetables, and nuts. Symptoms are usually localized in the form of itching and burning in mouth and throat.  Very rarely it can progress to more severe symptoms. Eating foods in cooked or processed forms usually minimizes symptoms. I recommended avoidance of eating the problem foods,  especially during the peak season(s). Sometimes, OFAS can induce severe throat swelling or even a systemic reaction; with such instance, I advised them to report to a local ER. A list of common pollens and food cross-reactivities was provided to the patient.   Return in about 1 year (around 03/23/2021), or if symptoms worsen or fail to improve.  Other allergy screening: Asthma: no Medication allergy: no Hymenoptera allergy: no Urticaria: no Eczema:no History of recurrent infections suggestive of immunodeficency: no  Diagnostics: Spirometry:  Tracings reviewed. Her effort: Good reproducible efforts. FVC: 2.91L FEV1: 2.60L, 93% predicted FEV1/FVC ratio: 89% Interpretation: Spirometry consistent with normal pattern.  Please see scanned spirometry results for details.  Skin Testing: Environmental allergy panel and select foods. Today's skin testing showed: Positive to dust mites, weed, trees, mold, and cockroach. Borderline to grass pollen.  Negative to beans, onions, cucumber, watermelon, garlic, [place] pig.  Results discussed with patient/family. Airborne Adult Perc - 03/23/20 0900    Time Antigen Placed  [id number]    Allergen Manufacturer  Lavella Hammock    Location  Back    Number of Test  59    Panel 1  Select    1. Control-Buffer 50% Glycerol  Negative    2. Control-Histamine 1 mg/ml  2+    3. Albumin saline  Negative    4. O'Donnell  Negative    5. [place]  Negative    6. Johnson  Negative    7. [place] Blue  Negative    8. Meadow Fescue  Negative    9. Perennial Rye  Negative    10. Sweet Vernal  Negative    11. Timothy  Negative    12. Cocklebur  Negative    13. Burweed Marshelder  Negative    14. Ragweed, short  Negative    15. Ragweed, Giant  Negative    16. Plantain,  English  Negative    17. Lamb's Quarters  Negative    18. Sheep Sorrell  Negative    19. Rough Pigweed  Negative    20. Marsh Elder, Rough  Negative    21. Mugwort, Common  Negative    22. Ash mix  Negative      23. Birch mix  Negative    24. Beech American  Negative    25. Box, Elder  Negative    26. Cedar, red  Negative    27. Cottonwood, [place]  Negative    28. Elm mix  Negative    29. Hickory  Negative    30. Maple mix  Negative    31. Oak, [place] mix  Negative    32. Pecan Pollen  Negative    33. Pine mix  Negative    34. Sycamore Eastern  Negative    35. [place], Black Pollen  Negative    36. Alternaria alternata  Negative    37. Cladosporium Herbarum  Negative    38. Aspergillus mix  Negative    39. Penicillium mix  Negative    40. Bipolaris sorokiniana (Helminthosporium)  Negative    41. Drechslera spicifera (Curvularia)  Negative    42. Mucor plumbeus  Negative    43. Fusarium moniliforme  Negative    44. Aureobasidium pullulans (pullulara)  Negative    45. Rhizopus oryzae  Negative    46. Botrytis cinera  Negative  47. Epicoccum nigrum  Negative    48. Phoma betae  Negative    49. Candida Albicans  Negative    50. Trichophyton mentagrophytes  Negative    51. Mite, D Farinae  5,000 AU/ml  Negative    52. Mite, D Pteronyssinus  5,000 AU/ml  3+    53. Cat Hair 10,000 BAU/ml  Negative    54.  Dog Epithelia  Negative    55. Mixed Feathers  Negative    56. Horse Epithelia  Negative    57. Cockroach, German  Negative    58. Mouse  Negative    59. Tobacco Leaf  Negative     Intradermal - 03/23/20 0956    Time Antigen Placed  [id number]    Allergen Manufacturer  Lavella Hammock    Location  Arm    Number of Test  12    Intradermal  Select    Control  Negative    Gu[place]  --   +/-   Johnson  --   +/-   7 Grass  --   +/-   Ragweed mix  Negative    Weed mix  2+    Tree mix  2+    Mold 1  2+    Mold 2  2+    Mold 3  Negative    Mold 4  2+    Cat  Negative    Dog  Negative    Cockroach  3+     Food Adult Perc - 03/23/20 0900    Time Antigen Placed  0901    Allergen Manufacturer  Greer    Location  Back    Number of allergen test  6    46. Navy Bean  Negative    49. Onion   Negative    54. Cucumber  Negative    62. Watermelon  Negative    70. Garlic  Negative    2. [place] Pig  Negative       Past Medical History: Patient Active Problem List   Diagnosis Date Noted  . Other allergic rhinitis 03/23/2020  . Pollen-food allergy 03/23/2020  . Coughing 03/23/2020  . Allergic conjunctivitis of both eyes 03/23/2020  . Seasonal allergies 02/23/2020  . Allergic reaction 02/23/2020  . Numbness and tingling of upper extremity 02/23/2020  . Hypertension 02/22/2020  . Obesity (BMI 30-39.9) 02/22/2020   Past Medical History:  Diagnosis Date  . Allergic rhinitis   . Allergy   . Blood in stool 2010   Patient reports hospitalization for blood in stool, diarrhea and extreme dehydration.  She was hospitalized at this time and a colonoscopy was completed.  . Chicken pox   . 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 Frequent headaches   . GERD (gastroesophageal reflux disease)   . Heart murmur   . Hypertension   . Irregular menstrual bleeding 2015  . Osteoarthritis of knee 2016  . Patellofemoral pain syndrome 04/2015  . Tinea corporis   . Tonsil stone 2016  . Vitamin D deficiency    Past Surgical History: Past Surgical History:  Procedure Laterality Date  . 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 CESAREAN SECTION  2012  . WISDOM TOOTH EXTRACTION     Medication List:  Current Outpatient Medications  Medication Sig Dispense Refill  . amLODipine (NORVASC) 10 MG tablet Take 1 tablet (10 mg total) by mouth daily. 90 tablet 1  . Fexofenadine HCl (ALLEGRA ALLERGY PO) Take by mouth.    Marland Kitchen  Multiple Vitamins-Minerals (MULTI COMPLETE PO) Take by mouth.     No current facility-administered medications for this visit.   Allergies: No Known Allergies Social History: Social History   Socioeconomic History  . Marital status: Married    Spouse name: Not on file  . Number of children: Not on file  . Years of education: Not on file  . Highest education level:  Not on file  Occupational History  . Not on file  Tobacco Use  . Smoking status: Never Smoker  . Smokeless tobacco: Never Used  Substance and Sexual Activity  . Alcohol use: Yes    Comment: socially   . Drug use: Never  . Sexual activity: Yes    Partners: Male  Other Topics Concern  . Not on file  Social History Narrative   Marital status/children/pets: Married, 1 child.   Education/employment: High school graduate, stay-at-home mom   Safety:      -Wears a bicycle helmet riding a bike: Yes     -smoke alarm in the home:Yes     - wears seatbelt: Yes     - Feels safe in their relationships: Yes   Social Determinants of Health   Financial Resource Strain:   . Difficulty of Paying Living Expenses:   Food Insecurity:   . Worried About Charity fundraiser in the Last Year:   . Arboriculturist in the Last Year:   Transportation Needs:   . Film/video editor (Medical):   Marland Kitchen Lack of Transportation (Non-Medical):   Physical Activity:   . Days of Exercise per Week:   . Minutes of Exercise per Session:   Stress:   . Feeling of Stress :   Social Connections:   . Frequency of Communication with Friends and Family:   . Frequency of Social Gatherings with Friends and Family:   . Attends Religious Services:   . Active Member of Clubs or Organizations:   . Attends Archivist Meetings:   Marland Kitchen Marital Status:    Lives in a 1998 home. Smoking: denies Occupation: stay at home  Environmental History: Water Damage/mildew in the house: no Carpet in the family room: no Carpet in the bedroom: yes Heating: electric Cooling: central Pet: yes 1 [place] pig x 5 years  Family History: Family History  Problem Relation Age of Onset  . Arthritis Mother   . Hyperlipidemia Mother   . Hypertension Mother   . Learning disabilities Mother   . Allergic rhinitis Mother   . Alcohol abuse Father   . Depression Father   . Asthma Brother   . Hyperlipidemia Brother   . Hypertension  Brother   . Allergic rhinitis Brother   . Allergic rhinitis Son   . Eczema Son   . Urticaria Son   . Bladder Cancer Maternal Grandmother   . Early death Maternal Grandmother   . Skin cancer Maternal Grandfather   . Early death Maternal Grandfather   . Stroke Maternal Grandfather   . Hypertension Maternal Grandfather   . Skin cancer Paternal Grandmother   . Skin cancer Paternal Grandfather   . Asthma Brother   . Hyperlipidemia Brother   . Hypertension Brother   . Allergic rhinitis Brother   . Breast cancer Neg Hx    Review of Systems  Constitutional: Negative for appetite change, chills, fever and unexpected weight change.  HENT: Positive for rhinorrhea and sneezing. Negative for congestion.   Eyes: Positive for itching.  Respiratory: Positive for cough. Negative for  shortness of breath and wheezing.   Cardiovascular: Negative for chest pain.  Gastrointestinal: Negative for abdominal pain.  Genitourinary: Negative for difficulty urinating.  Skin: Negative for rash.  Allergic/Immunologic: Positive for environmental allergies and food allergies.  Neurological: Positive for headaches.   Objective: BP 130/78 (BP Location: Right Arm, Patient Position: Sitting, Cuff Size: Normal)   Pulse 72   Temp 97.7 F (36.5 C) (Temporal)   Resp 18   Ht 5' 1.77" (1.569 m)   Wt 170 lb (77.1 kg)   SpO2 100%   BMI 31.32 kg/m  Body mass index is 31.32 kg/m. Physical Exam  Constitutional: She is oriented to person, place, and time. She appears well-developed and well-nourished.  HENT:  Head: Normocephalic and atraumatic.  Right Ear: External ear normal.  Left Ear: External ear normal.  Nose: Nose normal.  Mouth/Throat: Oropharynx is clear and moist.  Eyes: Conjunctivae and EOM are normal.  Cardiovascular: Normal rate, regular rhythm and normal heart sounds. Exam reveals no gallop and no friction rub.  No murmur heard. Pulmonary/Chest: Effort normal and breath sounds normal. She has no  wheezes. She has no rales.  Abdominal: Soft.  Musculoskeletal:     Cervical back: Neck supple.  Neurological: She is alert and oriented to person, place, and time.  Skin: Skin is warm. No rash noted.  Psychiatric: She has a normal mood and affect. Her behavior is normal.  Nursing note and vitals reviewed.  The plan was reviewed with the patient/family, and all questions/concerned were addressed.  It was my pleasure to see Maria Goodwin today and participate in her care. Please feel free to contact me with any questions or concerns.  Sincerely,  Rexene Alberts, DO Allergy & Immunology  [facility] office: [phone] [facility] office: [place] office: [phone]

## 2020-06-13 ENCOUNTER — Ambulatory Visit: Payer: Federal, State, Local not specified - PPO | Admitting: Family Medicine

## 2020-06-13 ENCOUNTER — Other Ambulatory Visit: Payer: Self-pay

## 2020-06-13 ENCOUNTER — Encounter: Payer: Self-pay | Admitting: Family Medicine

## 2020-06-13 VITALS — BP 123/81 | HR 87 | Temp 98.7°F | Resp 17 | Ht 62.0 in | Wt 169.2 lb

## 2020-06-13 DIAGNOSIS — N946 Dysmenorrhea, unspecified: Secondary | ICD-10-CM | POA: Diagnosis not present

## 2020-06-13 DIAGNOSIS — R002 Palpitations: Secondary | ICD-10-CM | POA: Diagnosis not present

## 2020-06-13 DIAGNOSIS — D649 Anemia, unspecified: Secondary | ICD-10-CM | POA: Diagnosis not present

## 2020-06-13 DIAGNOSIS — R300 Dysuria: Secondary | ICD-10-CM | POA: Diagnosis not present

## 2020-06-13 DIAGNOSIS — N920 Excessive and frequent menstruation with regular cycle: Secondary | ICD-10-CM | POA: Diagnosis not present

## 2020-06-13 MED ORDER — NAPROXEN 500 MG PO TABS
500.0000 mg | ORAL_TABLET | Freq: Two times a day (BID) | ORAL | 3 refills | Status: DC
Start: 2020-06-13 — End: 2020-09-14

## 2020-06-13 NOTE — Patient Instructions (Signed)
Start Naproxen day prior to cycle or morning of cycle. Take every 12 hours with food through your cycle. This will help both w/ decreasing the cramps and blood loss.   I will call you with lab results and discuss need for further evaluation dependent on those results.   Limit caffeine .   Next time you have bladder symptoms reference the bladder irritants and see if the irritation can be connected to diet.    Dysmenorrhea Dysmenorrhea means painful cramps during your period (menstrual period). You will have pain in your lower belly (abdomen). The pain is caused by the tightening (contracting) of the muscles of the womb (uterus). The pain may be mild or very bad. With this condition, you may:  Have a headache.  Feel sick to your stomach (nauseous).  Throw up (vomit).  Have lower back pain. Follow these instructions at home: Helping pain and cramping   Put heat on your lower back or belly when you have pain or cramps. Use the heat source that your doctor tells you to use. ? Place a towel between your skin and the heat. ? Leave the heat on for 20-30 minutes. ? Remove the heat if your skin turns bright red. This is especially important if you cannot feel pain, heat, or cold. ? Do not have a heating pad on during sleep.  Do aerobic exercises. These include walking, swimming, or biking. These may help with cramps.  Massage your lower back or belly. This may help lessen pain. General instructions  Take over-the-counter and prescription medicines only as told by your doctor.  Do not drive or use heavy machinery while taking prescription pain medicine.  Avoid alcohol and caffeine during and right before your period. These can make cramps worse.  Do not use any products that have nicotine or tobacco. These include cigarettes and e-cigarettes. If you need help quitting, ask your doctor.  Keep all follow-up visits as told by your doctor. This is important. Contact a doctor if:  You  have pain that gets worse.  You have pain that does not get better with medicine.  You have pain during sex.  You feel sick to your stomach or you throw up during your period, and medicine does not help. Get help right away if:  You pass out (faint). Summary  Dysmenorrhea means painful cramps during your period (menstrual period).  Put heat on your lower back or belly when you have pain or cramps.  Do exercises like walking, swimming, or biking to help with cramps.  Contact a doctor if you have pain during sex. This information is not intended to replace advice given to you by your health care provider. Make sure you discuss any questions you have with your health care provider. Document Revised: 10/17/2017 Document Reviewed: 11/21/2016 Elsevier Patient Education  [place].

## 2020-06-13 NOTE — Progress Notes (Signed)
This visit occurred during the SARS-CoV-2 public health emergency.  Safety protocols were in place, including screening questions prior to the visit, additional usage of staff PPE, and extensive cleaning of exam room while observing appropriate contact time as indicated for disinfecting solutions.    Maria Goodwin , 1981-10-25, 39 y.o., female MRN: [id number] Patient Care Team    Relationship Specialty Notifications Start End  [name], DO PCP - General Family Medicine  02/22/20     Chief Complaint  Patient presents with  . Abdominal Cramping    Pt is getting severe abd cramps during period x1 year.   . Palpitations    Pt is having palpitaitons at times. Does have some SOB/cough related with it. Nothing makes it worse or better.      Subjective: Pt presents for an OV with complaints of menstrual cramps that are worsening > 1 year and palpitations.   Menstrual cramps/dysuria:Patient's last menstrual period was 05/19/2020 (exact date).  Patient reports her menstrual cycles are rather routine approximately every 28 days, 4 days in length.  She does report heavy bleeding usually the first 2-3 days.  She also is experiencing more severe menstrual cramps.  These changes have been worsening over the last year.  She also endorses intermittent dysuria that occurs for 1 to 2 days and then resolves.  Palpitations: Patient endorses experiencing palpitations intermittently over the last year.  Occur about once a week, last for a few seconds.  She does drink caffeine, but has been cutting back on the caffeine and is still experiencing palpitations.  She has heavy menstrual cycles.  She also gave blood last week and became rather dizzy and almost passed out.  She has mildly low hemoglobin per EMR review over the last 2 years.  She reports when she gave blood her hemoglobin was 12.7 last week.  Depression screen PHQ 2/9 02/22/2020  Decreased Interest 0  Down, Depressed, Hopeless 0  PHQ - 2 Score  0    No Known Allergies Social History   Social History Narrative   Marital status/children/pets: Married, 1 child.   Education/employment: High school graduate, stay-at-home mom   Safety:      -Wears a bicycle helmet riding a bike: Yes     -smoke alarm in the home:Yes     - wears seatbelt: Yes     - Feels safe in their relationships: Yes   Past Medical History:  Diagnosis Date  . Allergic rhinitis   . Allergy   . Blood in stool 2010   Patient reports hospitalization for blood in stool, diarrhea and extreme dehydration.  She was hospitalized at this time and a colonoscopy was completed.  . Chicken pox   . 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 Frequent headaches   . GERD (gastroesophageal reflux disease)   . Heart murmur   . Hypertension   . Irregular menstrual bleeding 2015  . Osteoarthritis of knee 2016  . Patellofemoral pain syndrome 04/2015  . Tinea corporis   . Tonsil stone 2016  . Vitamin D deficiency    Past Surgical History:  Procedure Laterality Date  . 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 CESAREAN SECTION  2012  . WISDOM TOOTH EXTRACTION     Family History  Problem Relation Age of Onset  . Arthritis Mother   . Hyperlipidemia Mother   . Hypertension Mother   . Learning disabilities Mother   . Allergic rhinitis Mother   . Alcohol abuse  Father   . Depression Father   . Asthma Brother   . Hyperlipidemia Brother   . Hypertension Brother   . Allergic rhinitis Brother   . Allergic rhinitis Son   . Eczema Son   . Urticaria Son   . Bladder Cancer Maternal Grandmother   . Early death Maternal Grandmother   . Skin cancer Maternal Grandfather   . Early death Maternal Grandfather   . Stroke Maternal Grandfather   . Hypertension Maternal Grandfather   . Skin cancer Paternal Grandmother   . Skin cancer Paternal Grandfather   . Asthma Brother   . Hyperlipidemia Brother   . Hypertension Brother   . Allergic rhinitis Brother   . Breast cancer Neg Hx     Allergies as of 06/13/2020   No Known Allergies     Medication List       Accurate as of June 13, 2020 11:59 PM. If you have any questions, ask your nurse or doctor.        STOP taking these medications   ALLEGRA ALLERGY PO Stopped by: Howard Pouch, DO     TAKE these medications   amLODipine 10 MG tablet Commonly known as: NORVASC Take 1 tablet (10 mg total) by mouth daily.   MULTI COMPLETE PO Take by mouth.   naproxen 500 MG tablet Commonly known as: Naprosyn Take 1 tablet (500 mg total) by mouth 2 (two) times daily with a meal. Started by: Howard Pouch, DO       All past medical history, surgical history, allergies, family history, immunizations andmedications were updated in the EMR today and reviewed under the history and medication portions of their EMR.     ROS: Negative, with the exception of above mentioned in HPI   Objective:  BP 123/81 (BP Location: Left Arm, Patient Position: Sitting, Cuff Size: Normal)   Pulse 87   Temp 98.7 F (37.1 C) (Temporal)   Resp 17   Ht 5\' 2"  (1.575 m)   Wt 169 lb 4 oz (76.8 kg)   LMP 05/19/2020 (Exact Date)   SpO2 100%   BMI 30.96 kg/m  Body mass index is 30.96 kg/m. Gen: Afebrile. No acute distress. Nontoxic in appearance, well developed, well nourished.  Pleasant female. HENT: AT. [place].  Eyes:Pupils Equal Round Reactive to light, Extraocular movements intact,  Conjunctiva without redness, discharge or icterus. Neck/lymp/endocrine: Supple, no lymphadenopathy, no thyromegaly CV: RRR no murmur, no edema Chest: CTAB, no wheeze or crackles. Good air movement, normal resp effort.  Abd: Soft.  Overweight. NTND. BS present.  No masses palpated. No rebound or guarding.   Neuro: Normal gait. PERLA. EOMi. Alert. Oriented x3  Psych: Normal affect, dress and demeanor. Normal speech. Normal thought content and judgment.  No exam data present No results found. Results for orders placed or performed in visit on 06/13/20 (from  the past 24 hour(s))  TSH     Status: None   Collection Time: 06/13/20  4:03 PM  Result Value Ref Range   TSH 0.99 mIU/L  Ferritin     Status: Abnormal   Collection Time: 06/13/20  4:03 PM  Result Value Ref Range   Ferritin 6 (L) 16 - 232 ng/mL  Urinalysis w microscopic + reflex cultur     Status: None   Collection Time: 06/13/20  4:03 PM   Specimen: Urine  Result Value Ref Range   Color, Urine YELLOW YELLOW   APPearance CLEAR CLEAR   Specific Gravity, Urine 1.008 1.001 - 1.03  pH 6.5 5.0 - 8.0   Glucose, UA NEGATIVE NEGATIVE   Bilirubin Urine NEGATIVE NEGATIVE   Ketones, ur NEGATIVE NEGATIVE   Hgb urine dipstick NEGATIVE NEGATIVE   Protein, ur NEGATIVE NEGATIVE   Nitrites, Initial NEGATIVE NEGATIVE   Leukocyte Esterase NEGATIVE NEGATIVE   WBC, UA NONE SEEN 0 - 5 /HPF   RBC / HPF NONE SEEN 0 - 2 /HPF   Squamous Epithelial / LPF 0-5 < OR = 5 /HPF   Bacteria, UA NONE SEEN NONE SEEN /HPF   Hyaline Cast NONE SEEN NONE SEEN /LPF  REFLEXIVE URINE CULTURE     Status: None   Collection Time: 06/13/20  4:03 PM  Result Value Ref Range   Reflexve Urine Culture      Assessment/Plan: Kajal Scalici is a 39 y.o. female present for OV for  Menorrhagia with regular cycle/anemia Rule out iron deficiency, anemia and thyroid as possible causes or effects of the changes in cycles she is experiencing.  If heavy menstrual cycles are leading to anemia or iron deficiency would refer to gynecology to discuss possible available treatments The meantime, she was encouraged to start naproxen 500 mg either the day prior to her expected menstrual cycle or the day of her menstrual cycle and continue twice daily during menstrual cycle.  It was explained her this will help with the menstrual cramps and can lighten her menstrual cycle - TSH - CBC w/Diff - Iron - Ferritin  Palpitations Continue to avoid caffeine products for now.  She has been mildly anemic in the past with heavy menstrual cycles rule out  thyroid, iron deficiency and anemia as possible cause of her palpitations. - TSH - CBC w/Diff -Iron -Ferritin If no obvious cause identified by lab results, would refer to cardiology for further evaluation.  Dysuria Discussed cystitis with her today and common bladder irritants.  Patient will monitor symptoms and bladder irritants list. - Urinalysis w microscopic + reflex cultur   Reviewed expectations re: course of current medical issues.  Discussed self-management of symptoms.  Outlined signs and symptoms indicating need for more acute intervention.  Patient verbalized understanding and all questions were answered.  Patient received an After-Visit Summary.    Orders Placed This Encounter  Procedures  . TSH  . CBC w/Diff  . Iron  . Ferritin  . Urinalysis w microscopic + reflex cultur  . REFLEXIVE URINE CULTURE   Meds ordered this encounter  Medications  . naproxen (NAPROSYN) 500 MG tablet    Sig: Take 1 tablet (500 mg total) by mouth 2 (two) times daily with a meal.    Dispense:  30 tablet    Refill:  3   Referral Orders  No referral(s) requested today     Note is dictated utilizing voice recognition software. Although note has been proof read prior to signing, occasional typographical errors still can be missed. If any questions arise, please do not hesitate to call for verification.   electronically signed by:  Howard Pouch, DO  [place]

## 2020-06-14 LAB — CBC WITH DIFFERENTIAL/PLATELET
Absolute Monocytes: 738 cells/uL (ref 200–950)
Basophils Absolute: 85 cells/uL (ref 0–200)
Basophils Relative: 0.7 %
Eosinophils Absolute: 157 cells/uL (ref 15–500)
Eosinophils Relative: 1.3 %
HCT: 31.1 % — ABNORMAL LOW (ref 35.0–45.0)
Hemoglobin: 10.6 g/dL — ABNORMAL LOW (ref 11.7–15.5)
Lymphs Abs: 3037 cells/uL (ref 850–3900)
MCH: 28.3 pg (ref 27.0–33.0)
MCHC: 34.1 g/dL (ref 32.0–36.0)
MCV: 82.9 fL (ref 80.0–100.0)
MPV: 10 fL (ref 7.5–12.5)
Monocytes Relative: 6.1 %
Neutro Abs: 8083 cells/uL — ABNORMAL HIGH (ref 1500–7800)
Neutrophils Relative %: 66.8 %
Platelets: 485 10*3/uL — ABNORMAL HIGH (ref 140–400)
RBC: 3.75 10*6/uL — ABNORMAL LOW (ref 3.80–5.10)
RDW: 13.5 % (ref 11.0–15.0)
Total Lymphocyte: 25.1 %
WBC: 12.1 10*3/uL — ABNORMAL HIGH (ref 3.8–10.8)

## 2020-06-14 LAB — URINALYSIS W MICROSCOPIC + REFLEX CULTURE
Bacteria, UA: NONE SEEN /HPF
Bilirubin Urine: NEGATIVE
Glucose, UA: NEGATIVE
Hgb urine dipstick: NEGATIVE
Hyaline Cast: NONE SEEN /LPF
Ketones, ur: NEGATIVE
Leukocyte Esterase: NEGATIVE
Nitrites, Initial: NEGATIVE
Protein, ur: NEGATIVE
RBC / HPF: NONE SEEN /HPF (ref 0–2)
Specific Gravity, Urine: 1.008 (ref 1.001–1.03)
WBC, UA: NONE SEEN /HPF (ref 0–5)
pH: 6.5 (ref 5.0–8.0)

## 2020-06-14 LAB — NO CULTURE INDICATED

## 2020-06-14 LAB — TSH: TSH: 0.99 mIU/L

## 2020-06-14 LAB — FERRITIN: Ferritin: 6 ng/mL — ABNORMAL LOW (ref 16–232)

## 2020-06-14 LAB — IRON: Iron: 22 ug/dL — ABNORMAL LOW (ref 40–190)

## 2020-06-15 ENCOUNTER — Telehealth: Payer: Self-pay | Admitting: Family Medicine

## 2020-06-15 MED ORDER — POLYSACCHARIDE-IRON COMPLEX 150 MG PO CAPS: ORAL_CAPSULE | ORAL | 2 refills | Status: DC

## 2020-06-15 NOTE — Telephone Encounter (Signed)
Please inform patient the following information: Her iron levels are low at 22 (> 40 is normal). Her ferritin-which her iron stores are extremely low at 6 (> 16 normal) She is anemic with hemoglobin of 10.6 and hematocrit of 31.1. The above will cause fatigue and palpitations. She also had a mildly elevated white blood cell count.  Which would imply she may have a mild infection of some nature.  We will going to repeat these levels in 2 weeks to ensure they have returned to normal. Her thyroid levels are normal Her urinalysis is normal  I recommend she avoid scheduling blood donation again, at least until you are able to get her iron levels at a more appropriate level. She will need iron supplementation.  I have called in an iron polysaccharide once daily supplement.  She will need to take this for 3 months.  Follow-up in 10-12 weeks with provider on her iron deficiency anemia and palpitations.

## 2020-06-15 NOTE — Telephone Encounter (Signed)
Pt was called and given information, she verbalized understanding. All F/U appts and lab appt was made

## 2020-06-15 NOTE — Telephone Encounter (Signed)
Pt was called and VM was left to return call  °

## 2020-06-29 ENCOUNTER — Ambulatory Visit (INDEPENDENT_AMBULATORY_CARE_PROVIDER_SITE_OTHER): Payer: Federal, State, Local not specified - PPO | Admitting: Family Medicine

## 2020-06-29 ENCOUNTER — Other Ambulatory Visit: Payer: Self-pay

## 2020-06-29 DIAGNOSIS — D649 Anemia, unspecified: Secondary | ICD-10-CM

## 2020-06-29 LAB — CBC WITH DIFFERENTIAL/PLATELET
Basophils Absolute: 0.1 10*3/uL (ref 0.0–0.1)
Basophils Relative: 0.8 % (ref 0.0–3.0)
Eosinophils Absolute: 0.2 10*3/uL (ref 0.0–0.7)
Eosinophils Relative: 1.8 % (ref 0.0–5.0)
HCT: 32.3 % — ABNORMAL LOW (ref 36.0–46.0)
Hemoglobin: 10.6 g/dL — ABNORMAL LOW (ref 12.0–15.0)
Lymphocytes Relative: 26.4 % (ref 12.0–46.0)
Lymphs Abs: 2.6 10*3/uL (ref 0.7–4.0)
MCHC: 32.9 g/dL (ref 30.0–36.0)
MCV: 83.9 fl (ref 78.0–100.0)
Monocytes Absolute: 0.6 10*3/uL (ref 0.1–1.0)
Monocytes Relative: 6.5 % (ref 3.0–12.0)
Neutro Abs: 6.3 10*3/uL (ref 1.4–7.7)
Neutrophils Relative %: 64.5 % (ref 43.0–77.0)
Platelets: 580 10*3/uL — ABNORMAL HIGH (ref 150.0–400.0)
RBC: 3.85 Mil/uL — ABNORMAL LOW (ref 3.87–5.11)
RDW: 13.9 % (ref 11.5–15.5)
WBC: 9.8 10*3/uL (ref 4.0–10.5)

## 2020-08-15 ENCOUNTER — Encounter: Payer: Self-pay | Admitting: Family Medicine

## 2020-08-15 NOTE — Telephone Encounter (Signed)
Pt's last pap was completed 07/19/2017. She has it done every 3 years. She would like to know prior to appt if this will be done.  Please advise of message below.

## 2020-08-16 NOTE — Telephone Encounter (Signed)
If patient would like to have her Pap smear completed here with her physical, we can complete that for her with her physical. If she desires she could also be referred, or if already establish, have her gynecology team complete a Pap smear. The decision is hers, either way is fine.  However if she does decide to have a Pap here> please place this also on the schedule so that we can be prepared for her Pap.  Thanks.

## 2020-09-12 ENCOUNTER — Encounter: Payer: Self-pay | Admitting: Family Medicine

## 2020-09-13 ENCOUNTER — Other Ambulatory Visit: Payer: Self-pay

## 2020-09-14 ENCOUNTER — Ambulatory Visit (INDEPENDENT_AMBULATORY_CARE_PROVIDER_SITE_OTHER): Payer: Federal, State, Local not specified - PPO | Admitting: Family Medicine

## 2020-09-14 ENCOUNTER — Other Ambulatory Visit (HOSPITAL_COMMUNITY)
Admission: RE | Admit: 2020-09-14 | Discharge: 2020-09-14 | Disposition: A | Discharge status: Home / Self Care | Payer: Federal, State, Local not specified - PPO | Source: Ambulatory Visit | Attending: Family Medicine | Admitting: Family Medicine

## 2020-09-14 ENCOUNTER — Encounter: Payer: Self-pay | Admitting: Family Medicine

## 2020-09-14 VITALS — BP 124/84 | HR 88 | Temp 98.9°F | Ht 61.0 in | Wt 170.0 lb

## 2020-09-14 DIAGNOSIS — Z01419 Encounter for gynecological examination (general) (routine) without abnormal findings: Secondary | ICD-10-CM | POA: Insufficient documentation

## 2020-09-14 DIAGNOSIS — N946 Dysmenorrhea, unspecified: Secondary | ICD-10-CM

## 2020-09-14 DIAGNOSIS — R002 Palpitations: Secondary | ICD-10-CM

## 2020-09-14 DIAGNOSIS — N920 Excessive and frequent menstruation with regular cycle: Secondary | ICD-10-CM

## 2020-09-14 DIAGNOSIS — D649 Anemia, unspecified: Secondary | ICD-10-CM

## 2020-09-14 DIAGNOSIS — I10 Essential (primary) hypertension: Secondary | ICD-10-CM

## 2020-09-14 DIAGNOSIS — Z131 Encounter for screening for diabetes mellitus: Secondary | ICD-10-CM

## 2020-09-14 DIAGNOSIS — Z1159 Encounter for screening for other viral diseases: Secondary | ICD-10-CM | POA: Diagnosis not present

## 2020-09-14 DIAGNOSIS — Z13220 Encounter for screening for lipoid disorders: Secondary | ICD-10-CM | POA: Diagnosis not present

## 2020-09-14 DIAGNOSIS — Z1231 Encounter for screening mammogram for malignant neoplasm of breast: Secondary | ICD-10-CM

## 2020-09-14 DIAGNOSIS — Z0000 Encounter for general adult medical examination without abnormal findings: Secondary | ICD-10-CM

## 2020-09-14 DIAGNOSIS — E669 Obesity, unspecified: Secondary | ICD-10-CM

## 2020-09-14 LAB — CBC WITH DIFFERENTIAL/PLATELET
Basophils Absolute: 0.1 10*3/uL (ref 0.0–0.1)
Basophils Relative: 0.8 % (ref 0.0–3.0)
Eosinophils Absolute: 0.1 10*3/uL (ref 0.0–0.7)
Eosinophils Relative: 1.4 % (ref 0.0–5.0)
HCT: 39.6 % (ref 36.0–46.0)
Hemoglobin: 12.8 g/dL (ref 12.0–15.0)
Lymphocytes Relative: 24.9 % (ref 12.0–46.0)
Lymphs Abs: 2.4 10*3/uL (ref 0.7–4.0)
MCHC: 32.3 g/dL (ref 30.0–36.0)
MCV: 84.2 fl (ref 78.0–100.0)
Monocytes Absolute: 0.6 10*3/uL (ref 0.1–1.0)
Monocytes Relative: 6.7 % (ref 3.0–12.0)
Neutro Abs: 6.3 10*3/uL (ref 1.4–7.7)
Neutrophils Relative %: 66.2 % (ref 43.0–77.0)
Platelets: 522 10*3/uL — ABNORMAL HIGH (ref 150.0–400.0)
RBC: 4.7 Mil/uL (ref 3.87–5.11)
RDW: 16.1 % — ABNORMAL HIGH (ref 11.5–15.5)
WBC: 9.5 10*3/uL (ref 4.0–10.5)

## 2020-09-14 LAB — COMPREHENSIVE METABOLIC PANEL
ALT: 30 U/L (ref 0–35)
AST: 22 U/L (ref 0–37)
Albumin: 4.6 g/dL (ref 3.5–5.2)
Alkaline Phosphatase: 88 U/L (ref 39–117)
BUN: 9 mg/dL (ref 6–23)
CO2: 30 mEq/L (ref 19–32)
Calcium: 9.6 mg/dL (ref 8.4–10.5)
Chloride: 100 mEq/L (ref 96–112)
Creatinine, Ser: 0.81 mg/dL (ref 0.40–1.20)
GFR: 90.16 mL/min (ref >60.00)
Glucose, Bld: 89 mg/dL (ref 70–99)
Potassium: 4.1 mEq/L (ref 3.5–5.1)
Sodium: 136 mEq/L (ref 135–145)
Total Bilirubin: 0.3 mg/dL (ref 0.2–1.2)
Total Protein: 7.1 g/dL (ref 6.0–8.3)

## 2020-09-14 LAB — LIPID PANEL
Cholesterol: 201 mg/dL — ABNORMAL HIGH (ref 0–200)
HDL: 59.9 mg/dL (ref >39.00)
LDL Cholesterol: 110 mg/dL — ABNORMAL HIGH (ref 0–99)
NonHDL: 140.65
Total CHOL/HDL Ratio: 3
Triglycerides: 154 mg/dL — ABNORMAL HIGH (ref 0.0–149.0)
VLDL: 30.8 mg/dL (ref 0.0–40.0)

## 2020-09-14 LAB — HEMOGLOBIN A1C: Hgb A1c MFr Bld: 5.7 % (ref 4.6–6.5)

## 2020-09-14 MED ORDER — AMLODIPINE BESYLATE 10 MG PO TABS
10.0000 mg | ORAL_TABLET | Freq: Every day | ORAL | 1 refills | Status: DC
Start: 2020-09-14 — End: 2021-03-12

## 2020-09-14 MED ORDER — NAPROXEN 500 MG PO TABS
500.0000 mg | ORAL_TABLET | Freq: Two times a day (BID) | ORAL | 3 refills | Status: DC
Start: 2020-09-14 — End: 2021-03-12

## 2020-09-14 NOTE — Patient Instructions (Signed)
Health Maintenance, Female Adopting a healthy lifestyle and getting preventive care are important in promoting health and wellness. Ask your health care provider about:  The right schedule for you to have regular tests and exams.  Things you can do on your own to prevent diseases and keep yourself healthy. What should I know about diet, weight, and exercise? Eat a healthy diet   Eat a diet that includes plenty of vegetables, fruits, low-fat dairy products, and lean protein.  Do not eat a lot of foods that are high in solid fats, added sugars, or sodium. Maintain a healthy weight Body mass index (BMI) is used to identify weight problems. It estimates body fat based on height and weight. Your health care provider can help determine your BMI and help you achieve or maintain a healthy weight. Get regular exercise Get regular exercise. This is one of the most important things you can do for your health. Most adults should:  Exercise for at least 150 minutes each week. The exercise should increase your heart rate and make you sweat (moderate-intensity exercise).  Do strengthening exercises at least twice a week. This is in addition to the moderate-intensity exercise.  Spend less time sitting. Even light physical activity can be beneficial. Watch cholesterol and blood lipids Have your blood tested for lipids and cholesterol at 41 years of age, then have this test every 5 years. Have your cholesterol levels checked more often if:  Your lipid or cholesterol levels are high.  You are older than 40 years of age.  You are at high risk for heart disease. What should I know about cancer screening? Depending on your health history and family history, you may need to have cancer screening at various ages. This may include screening for:  Breast cancer.  Cervical cancer.  Colorectal cancer.  Skin cancer.  Lung cancer. What should I know about heart disease, diabetes, and high blood  pressure? Blood pressure and heart disease  High blood pressure causes heart disease and increases the risk of stroke. This is more likely to develop in people who have high blood pressure readings, are of African descent, or are overweight.  Have your blood pressure checked: ? Every 3-5 years if you are 18-39 years of age. ? Every year if you are 40 years old or older. Diabetes Have regular diabetes screenings. This checks your fasting blood sugar level. Have the screening done:  Once every three years after age 40 if you are at a normal weight and have a low risk for diabetes.  More often and at a younger age if you are overweight or have a high risk for diabetes. What should I know about preventing infection? Hepatitis B If you have a higher risk for hepatitis B, you should be screened for this virus. Talk with your health care provider to find out if you are at risk for hepatitis B infection. Hepatitis C Testing is recommended for:  Everyone born from 1945 through 1965.  Anyone with known risk factors for hepatitis C. Sexually transmitted infections (STIs)  Get screened for STIs, including gonorrhea and chlamydia, if: ? You are sexually active and are younger than 41 years of age. ? You are older than 41 years of age and your health care provider tells you that you are at risk for this type of infection. ? Your sexual activity has changed since you were last screened, and you are at increased risk for chlamydia or gonorrhea. Ask your health care provider if   you are at risk.  Ask your health care provider about whether you are at high risk for HIV. Your health care provider may recommend a prescription medicine to help prevent HIV infection. If you choose to take medicine to prevent HIV, you should first get tested for HIV. You should then be tested every 3 months for as long as you are taking the medicine. Pregnancy  If you are about to stop having your period (premenopausal) and  you may become pregnant, seek counseling before you get pregnant.  Take 400 to 800 micrograms (mcg) of folic acid every day if you become pregnant.  Ask for birth control (contraception) if you want to prevent pregnancy. Osteoporosis and menopause Osteoporosis is a disease in which the bones lose minerals and strength with aging. This can result in bone fractures. If you are 65 years old or older, or if you are at risk for osteoporosis and fractures, ask your health care provider if you should:  Be screened for bone loss.  Take a calcium or vitamin D supplement to lower your risk of fractures.  Be given hormone replacement therapy (HRT) to treat symptoms of menopause. Follow these instructions at home: Lifestyle  Do not use any products that contain nicotine or tobacco, such as cigarettes, e-cigarettes, and chewing tobacco. If you need help quitting, ask your health care provider.  Do not use street drugs.  Do not share needles.  Ask your health care provider for help if you need support or information about quitting drugs. Alcohol use  Do not drink alcohol if: ? Your health care provider tells you not to drink. ? You are pregnant, may be pregnant, or are planning to become pregnant.  If you drink alcohol: ? Limit how much you use to 0-1 drink a day. ? Limit intake if you are breastfeeding.  Be aware of how much alcohol is in your drink. In the [place] one drink equals one 12 oz bottle of beer (355 mL), one 5 oz glass of wine (148 mL), or one 1 oz glass of hard liquor (44 mL). General instructions  Schedule regular health, dental, and eye exams.  Stay current with your vaccines.  Tell your health care provider if: ? You often feel depressed. ? You have ever been abused or do not feel safe at home. Summary  Adopting a healthy lifestyle and getting preventive care are important in promoting health and wellness.  Follow your health care provider's instructions about healthy  diet, exercising, and getting tested or screened for diseases.  Follow your health care provider's instructions on monitoring your cholesterol and blood pressure. This information is not intended to replace advice given to you by your health care provider. Make sure you discuss any questions you have with your health care provider. Document Revised: 10/28/2018 Document Reviewed: 10/28/2018 [employer] Patient Education  2020 [employer]  

## 2020-09-14 NOTE — Progress Notes (Signed)
This visit occurred during the SARS-CoV-2 public health emergency.  Safety protocols were in place, including screening questions prior to the visit, additional usage of staff PPE, and extensive cleaning of exam room while observing appropriate contact time as indicated for disinfecting solutions.    Patient ID: Maria Goodwin, female  DOB: 06/16/1981, 39 y.o.   MRN: [id number] Patient Care Team    Relationship Specialty Notifications Start End  [name], DO PCP - General Family Medicine  02/22/20     Chief Complaint  Patient presents with  . Annual Exam    pt is not fasting    Subjective:  Maria Goodwin is a 39 y.o.  Female  present for CPE. All past medical history, surgical history, allergies, family history, immunizations, medications and social history were updated in the electronic medical record today. All recent labs, ED visits and hospitalizations within the last year were reviewed.  Health maintenance:  Colonoscopy: routine screen at 10 Mammogram: completed:12/2019- had abnormal with normal [place] follow up Cervical cancer screening: last pap: 2018, > completed tpday w/ cotest Immunizations: tdap UTD 2017, Influenza UTD 2021(encouraged yearly), covid pfizer series completed.  Infectious disease screening: HIV completed, Hep C agreeable to testing today DEXA: routine screen Assistive device: none Oxygen FUX:NATF Patient has a Dental home. Hospitalizations/ED visits: reviewed  Menstrual cramps/menorrhagia/IDA:Patient reports her menstrual cycles are rather routine approximately every 28 days, 4 days in length.  SHe was having heavy bleeding and was found to be anemic 3 mos ago. She started the naproxen surrounding the day of her cycle and has seen a great decrease in her bleeding pattern and cramps. She has been taking her iron supplement daily.   Hypertension/obesity:  Pt reports compliance with amlodipine 10 mg daily. Blood pressures ranges at home routinely  checked.Patient denies chest pain, shortness of breath, dizziness or lower extremity edema.  Pt is not taking a daily baby ASA. Pt is not prescribed a prescribed statin. RF: Hypertension, obesity, family history of hypertension, hyperlipidemia and stroke   Depression screen [facility] 2/9 09/14/2020 02/22/2020  Decreased Interest 0 0  Down, Depressed, Hopeless 0 0  PHQ - 2 Score 0 0   No flowsheet data found.   Immunization History  Administered Date(s) Administered  . Hepatitis A 08/15/2016, 02/11/2017  . Hepatitis B 01/31/1987  . Influenza-Unspecified 09/07/2019, 08/02/2020  . PFIZER SARS-COV-2 Vaccination 02/08/2020, 02/29/2020  . Tdap 08/15/2016     Past Medical History:  Diagnosis Date  . Allergic reaction 02/23/2020   Itchy mouth with watermelon and cucumber.  Refer to allergist.  . Allergic rhinitis   . Allergy   . Blood in stool 2010   Patient reports hospitalization for blood in stool, diarrhea and extreme dehydration.  She was hospitalized at this time and a colonoscopy was completed.  . Chicken pox   . 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 Frequent headaches   . GERD (gastroesophageal reflux disease)   . Heart murmur   . Hypertension   . Irregular menstrual bleeding 2015  . Osteoarthritis of knee 2016  . Patellofemoral pain syndrome 04/2015  . Tinea corporis   . Tonsil stone 2016  . Vitamin D deficiency    No Known Allergies Past Surgical History:  Procedure Laterality Date  . 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 CESAREAN SECTION  2012  . WISDOM TOOTH EXTRACTION     Family History  Problem Relation Age of Onset  . Arthritis Mother   . Hyperlipidemia Mother   .  Hypertension Mother   . Learning disabilities Mother   . Allergic rhinitis Mother   . Alcohol abuse Father   . Depression Father   . Asthma Brother   . Hyperlipidemia Brother   . Hypertension Brother   . Allergic rhinitis Brother   . Allergic rhinitis Son   . Eczema Son   . Urticaria  Son   . Bladder Cancer Maternal Grandmother   . Early death Maternal Grandmother   . Skin cancer Maternal Grandfather   . Early death Maternal Grandfather   . Stroke Maternal Grandfather   . Hypertension Maternal Grandfather   . Skin cancer Paternal Grandmother   . Skin cancer Paternal Grandfather   . Asthma Brother   . Hyperlipidemia Brother   . Hypertension Brother   . Allergic rhinitis Brother   . Breast cancer Neg Hx    Social History   Social History Narrative   Marital status/children/pets: Married, 1 child.   Education/employment: High school graduate, stay-at-home mom   Safety:      -Wears a bicycle helmet riding a bike: Yes     -smoke alarm in the home:Yes     - wears seatbelt: Yes     - Feels safe in their relationships: Yes    Allergies as of 09/14/2020   No Known Allergies     Medication List       Accurate as of September 14, 2020  9:04 AM. If you have any questions, ask your nurse or doctor.        amLODipine 10 MG tablet Commonly known as: NORVASC Take 1 tablet (10 mg total) by mouth daily.   MULTI COMPLETE PO Take by mouth.   naproxen 500 MG tablet Commonly known as: Naprosyn Take 1 tablet (500 mg total) by mouth 2 (two) times daily with a meal.   Polysaccharide-Iron Complex 150 MG Caps One tab daily p.o.       All past medical history, surgical history, allergies, family history, immunizations andmedications were updated in the EMR today and reviewed under the history and medication portions of their EMR.      ROS: 14 pt review of systems performed and negative (unless mentioned in an HPI)  Objective: BP 124/84   Pulse 88   Temp 98.9 F (37.2 C) (Oral)   Ht 5\' 1"  (1.549 m)   Wt 170 lb (77.1 kg)   SpO2 98%   BMI 32.12 kg/m  Gen: Afebrile. No acute distress. Nontoxic in appearance, well-developed, well-nourished,  Pleasant obese female.  HENT: AT. [place]. Bilateral TM visualized and normal in appearance, normal external auditory canal.  MMM, no oral lesions, adequate dentition. Bilateral nares within normal limits. Throat without erythema, ulcerations or exudates. no Cough on exam, no hoarseness on exam. Eyes:Pupils Equal Round Reactive to light, Extraocular movements intact,  Conjunctiva without redness, discharge or icterus. Neck/lymp/endocrine: Supple,no lymphadenopathy, no thyromegaly CV: RRR no murmur, no edema, +2/4 P posterior tibialis pulses.  Chest: CTAB, no wheeze, rhonchi or crackles. no Respiratory effort. no Air movement. Abd: Soft. flat. NTND. BS present. no Masses palpated. No hepatosplenomegaly. No rebound tenderness or guarding. Skin: no rashes, purpura or petechiae. Warm and well-perfused. Skin intact. Neuro/Msk:  Normal gait. PERLA. EOMi. Alert. Oriented x3.  Cranial nerves II through XII intact. Muscle strength 5/5 upper/lower extremity. DTRs equal bilaterally. Psych: Normal affect, dress and demeanor. Normal speech. Normal thought content and judgment. Breasts: breasts appear normal, symmetrical, no tenderness on exam, no suspicious masses, no skin or nipple changes  or axillary nodes. GYN:  External genitalia within normal limits, normal hair distribution, no lesions. Urethral meatus normal, no lesions. Vaginal mucosa pink, moist, normal rugae, no lesions. No cystocele or rectocele. cervix without lesions, no discharge. Bimanual exam revealed normal uterus.  No bladder/suprapubic fullness, masses or tenderness. No cervical motion tenderness. No adnexal fullness. Anus and perineum within normal limits, no lesions.  No exam data present  Assessment/plan: Maria Goodwin is a 39 y.o. female present for  CPE  Encounter for gynecological examination with Papanicolaou smear of cervix - Cytology - PAP( [place]) w/ co-test Diabetes mellitus screening - Hemoglobin A1c Lipid screening - Lipid panel Anemia, unspecified type/Menorrhagia with regular cycle/Severe menstrual cramps Taking iron daily. Continued dosing  will be discussed after lab results. Her cycles are much lighter now and she had also given blood prior to collection last time.  - Continue naproxen around menses.  - CBC with Differential/Platelet - Iron, TIBC and Ferritin Panel Palpitations - Comprehensive metabolic panel Need for hepatitis C screening test - Hepatitis C Antibody Encounter for screening mammogram for malignant neoplasm of breast - MM 3D SCREEN BREAST BILATERAL; Future  Essential hypertension/Obesity (BMI 30-39.9) Stable.  Continue  amlodipine 10 mg daily Low-sodium diet Routine exercise Reviewed outside labs dated October/2020 Follow-up 5.5 mos  Encounter for preventive health examination Patient was encouraged to exercise greater than 150 minutes a week. Patient was encouraged to choose a diet filled with fresh fruits and vegetables, and lean meats. AVS provided to patient today for education/recommendation on gender specific health and safety maintenance. Colonoscopy: routine screen at 32 Mammogram: completed:12/2019- had abnormal with normal [place] follow up Cervical cancer screening: last pap: 2018, > completed tpday w/ cotest Immunizations: tdap UTD 2017, Influenza UTD 2021(encouraged yearly), covid pfizer series completed.  Infectious disease screening: HIV completed, Hep C agreeable to testing today DEXA: routine screen Return in about 24 weeks (around 03/01/2021) for [facility] (30 min) and 1 yr cpe.  Orders Placed This Encounter  Procedures  . MM 3D SCREEN BREAST BILATERAL  . CBC with Differential/Platelet  . Comprehensive metabolic panel  . Hemoglobin A1c  . Lipid panel  . Hepatitis C Antibody  . Iron, TIBC and Ferritin Panel    Orders Placed This Encounter  Procedures  . MM 3D SCREEN BREAST BILATERAL  . CBC with Differential/Platelet  . Comprehensive metabolic panel  . Hemoglobin A1c  . Lipid panel  . Hepatitis C Antibody  . Iron, TIBC and Ferritin Panel   Meds ordered this encounter  Medications   . naproxen (NAPROSYN) 500 MG tablet    Sig: Take 1 tablet (500 mg total) by mouth 2 (two) times daily with a meal.    Dispense:  30 tablet    Refill:  3  . amLODipine (NORVASC) 10 MG tablet    Sig: Take 1 tablet (10 mg total) by mouth daily.    Dispense:  90 tablet    Refill:  1   Referral Orders  No referral(s) requested today     Electronically signed by: Howard Pouch, [place]

## 2020-09-15 ENCOUNTER — Telehealth: Payer: Self-pay | Admitting: Family Medicine

## 2020-09-15 LAB — IRON,TIBC AND FERRITIN PANEL
%SAT: 22 % (calc) (ref 16–45)
Ferritin: 15 ng/mL — ABNORMAL LOW (ref 16–232)
Iron: 76 ug/dL (ref 40–190)
TIBC: 347 mcg/dL (calc) (ref 250–450)

## 2020-09-15 LAB — HEPATITIS C ANTIBODY
Hepatitis C Ab: NONREACTIVE
SIGNAL TO CUT-OFF: 0.01 (ref <1.00)

## 2020-09-15 MED ORDER — POLYSACCHARIDE-IRON COMPLEX 150 MG PO CAPS: ORAL_CAPSULE | ORAL | 5 refills | Status: DC

## 2020-09-15 NOTE — Telephone Encounter (Signed)
Spoke with pt regarding labs and instructions.   

## 2020-09-15 NOTE — Telephone Encounter (Signed)
Please call patient -Liver, kidney and thyroid function are normal -Blood cell counts and electrolytes are normal -Diabetes screening/A1c is normal at  5.7 -Cholesterol panel looks good and at goal. -Iron panel has improved a good deal from iron level of 22, now 76.  Ferritin was 6, now 15.  However this is still low on iron stores/ferritin.   -Since she is seen improvement in her menstrual cycles and her hemoglobin and iron is now in normal range.  I recommend she continue the iron supplement 3 days a week (Monday, Wednesday, Friday etc.) and we will recheck at her next appointment in 6 months.  At that time she should be able to build up her iron stores if menstrual cycles remain well controlled.  I have called in her iron supplement refill label state once daily, which she can take Monday Wednesday Friday only.

## 2020-09-15 NOTE — Telephone Encounter (Signed)
LVM for pt to CB regarding results.  

## 2020-09-18 LAB — CYTOLOGY - PAP
Comment: NEGATIVE
Diagnosis: NEGATIVE
High risk HPV: NEGATIVE

## 2020-12-14 ENCOUNTER — Other Ambulatory Visit: Payer: Self-pay | Admitting: Family Medicine

## 2020-12-14 ENCOUNTER — Encounter: Payer: Self-pay | Admitting: Family Medicine

## 2020-12-14 DIAGNOSIS — Z1231 Encounter for screening mammogram for malignant neoplasm of breast: Secondary | ICD-10-CM

## 2020-12-14 NOTE — Telephone Encounter (Signed)
Please advise, pt will need to schedule an OV with you correct?

## 2020-12-15 NOTE — Telephone Encounter (Signed)
Please call breast center on behalf of patient.  Her last mammogram February 2021 states  "RECOMMENDATION: Screening mammogram in one year.(Code:[id number])"  Breast center canceled her mammogram screening next week and told her she needed a diagnostic mammogram because she told them she had history of lumps in her breast.  She does not have any new lumps since her last mammogram and a screening mammogram is all that is indicated for this patient.  They need to reschedule her screening mammogram.   Please also make patient aware.   Thanks so much.

## 2020-12-18 ENCOUNTER — Other Ambulatory Visit: Payer: Self-pay | Admitting: Family Medicine

## 2020-12-18 DIAGNOSIS — Z1231 Encounter for screening mammogram for malignant neoplasm of breast: Secondary | ICD-10-CM

## 2020-12-26 ENCOUNTER — Ambulatory Visit
Admission: RE | Admit: 2020-12-26 | Discharge: 2020-12-26 | Disposition: A | Discharge status: Home / Self Care | Payer: Federal, State, Local not specified - PPO | Source: Ambulatory Visit

## 2020-12-26 ENCOUNTER — Other Ambulatory Visit: Payer: Self-pay

## 2020-12-26 DIAGNOSIS — Z1231 Encounter for screening mammogram for malignant neoplasm of breast: Secondary | ICD-10-CM | POA: Diagnosis not present

## 2020-12-28 ENCOUNTER — Other Ambulatory Visit: Payer: Self-pay | Admitting: Family Medicine

## 2020-12-28 ENCOUNTER — Encounter: Payer: Self-pay | Admitting: Family Medicine

## 2020-12-28 DIAGNOSIS — R928 Other abnormal and inconclusive findings on diagnostic imaging of breast: Secondary | ICD-10-CM | POA: Insufficient documentation

## 2020-12-28 DIAGNOSIS — N6320 Unspecified lump in the left breast, unspecified quadrant: Secondary | ICD-10-CM

## 2021-01-04 DIAGNOSIS — Z1231 Encounter for screening mammogram for malignant neoplasm of breast: Secondary | ICD-10-CM

## 2021-01-10 ENCOUNTER — Ambulatory Visit
Admission: RE | Admit: 2021-01-10 | Discharge: 2021-01-10 | Disposition: A | Discharge status: Home / Self Care | Payer: Federal, State, Local not specified - PPO | Source: Ambulatory Visit | Attending: Family Medicine | Admitting: Family Medicine

## 2021-01-10 ENCOUNTER — Other Ambulatory Visit: Payer: Self-pay

## 2021-01-10 ENCOUNTER — Other Ambulatory Visit: Payer: Self-pay | Admitting: Family Medicine

## 2021-01-10 DIAGNOSIS — N6321 Unspecified lump in the left breast, upper outer quadrant: Secondary | ICD-10-CM | POA: Diagnosis not present

## 2021-01-10 DIAGNOSIS — R928 Other abnormal and inconclusive findings on diagnostic imaging of breast: Secondary | ICD-10-CM | POA: Diagnosis not present

## 2021-01-10 DIAGNOSIS — N6320 Unspecified lump in the left breast, unspecified quadrant: Secondary | ICD-10-CM

## 2021-01-15 ENCOUNTER — Ambulatory Visit
Admission: RE | Admit: 2021-01-15 | Discharge: 2021-01-15 | Disposition: A | Discharge status: Home / Self Care | Payer: Federal, State, Local not specified - PPO | Source: Ambulatory Visit | Attending: Family Medicine | Admitting: Family Medicine

## 2021-01-15 ENCOUNTER — Other Ambulatory Visit: Payer: Self-pay

## 2021-01-15 DIAGNOSIS — N6320 Unspecified lump in the left breast, unspecified quadrant: Secondary | ICD-10-CM

## 2021-01-15 DIAGNOSIS — D242 Benign neoplasm of left breast: Secondary | ICD-10-CM | POA: Diagnosis not present

## 2021-01-15 DIAGNOSIS — N6321 Unspecified lump in the left breast, upper outer quadrant: Secondary | ICD-10-CM | POA: Diagnosis not present

## 2021-01-15 HISTORY — PX: BREAST BIOPSY: SHX20

## 2021-02-06 DIAGNOSIS — D225 Melanocytic nevi of trunk: Secondary | ICD-10-CM | POA: Diagnosis not present

## 2021-02-06 DIAGNOSIS — L718 Other rosacea: Secondary | ICD-10-CM | POA: Diagnosis not present

## 2021-02-06 DIAGNOSIS — L738 Other specified follicular disorders: Secondary | ICD-10-CM | POA: Diagnosis not present

## 2021-02-06 DIAGNOSIS — L910 Hypertrophic scar: Secondary | ICD-10-CM | POA: Diagnosis not present

## 2021-03-12 ENCOUNTER — Other Ambulatory Visit: Payer: Self-pay

## 2021-03-12 ENCOUNTER — Encounter: Payer: Self-pay | Admitting: Family Medicine

## 2021-03-12 ENCOUNTER — Ambulatory Visit (INDEPENDENT_AMBULATORY_CARE_PROVIDER_SITE_OTHER): Payer: Federal, State, Local not specified - PPO | Admitting: Family Medicine

## 2021-03-12 VITALS — BP 117/77 | HR 82 | Temp 98.6°F | Wt 169.0 lb

## 2021-03-12 DIAGNOSIS — R002 Palpitations: Secondary | ICD-10-CM | POA: Diagnosis not present

## 2021-03-12 DIAGNOSIS — E669 Obesity, unspecified: Secondary | ICD-10-CM

## 2021-03-12 DIAGNOSIS — I10 Essential (primary) hypertension: Secondary | ICD-10-CM | POA: Diagnosis not present

## 2021-03-12 DIAGNOSIS — E611 Iron deficiency: Secondary | ICD-10-CM

## 2021-03-12 DIAGNOSIS — N920 Excessive and frequent menstruation with regular cycle: Secondary | ICD-10-CM

## 2021-03-12 LAB — TSH: TSH: 0.83 u[IU]/mL (ref 0.35–4.50)

## 2021-03-12 MED ORDER — POLYSACCHARIDE-IRON COMPLEX 150 MG PO CAPS
ORAL_CAPSULE | ORAL | 5 refills | Status: DC
Start: 2021-03-12 — End: 2021-08-28

## 2021-03-12 MED ORDER — AMLODIPINE BESYLATE 10 MG PO TABS
10.0000 mg | ORAL_TABLET | Freq: Every day | ORAL | 1 refills | Status: DC
Start: 2021-03-12 — End: 2021-10-15

## 2021-03-12 MED ORDER — NAPROXEN 500 MG PO TABS: 500.0000 mg | ORAL_TABLET | Freq: Two times a day (BID) | ORAL | 3 refills | Status: DC

## 2021-03-12 NOTE — Progress Notes (Signed)
This visit occurred during the SARS-CoV-2 public health emergency.  Safety protocols were in place, including screening questions prior to the visit, additional usage of staff PPE, and extensive cleaning of exam room while observing appropriate contact time as indicated for disinfecting solutions.    Maria Goodwin , [birth year], 40 y.o., female MRN: [id number] Patient Care Team    Relationship Specialty Notifications Start End  Maria Hillock, DO PCP - General Family Medicine  02/22/20     Chief Complaint  Patient presents with  . Follow-up    Iron; pt is fasting      Subjective: Pt presents for an OV [facility] Menstrual cramps/menorrhagia/IDA: cramps ad bleeding pattern improved on naproxen. She takes naproxen a few days prior to menses and through menses. Currently taking iron M,W,F and tolerating.   Hypertension/obesity/palpitations: Pt reports compliance withamlodipine 10 mg daily. Blood pressures ranges at homeroutinely checked. Patient denies chest pain, shortness of breath, dizziness or lower extremity edema. She does endorse palpitations. She does not endorse caffeine or stimulant use. She states palpations occur about 3x a week and last a few seconds- no associated symptoms with them.  Ptis not taking adaily baby ASA. Pt is not prescribed aprescribed statin. VQ:[id number], obesity, family history of hypertension, hyperlipidemia and stroke  Depression screen [facility] 2/9 09/14/2020 02/22/2020  Decreased Interest 0 0  Down, Depressed, Hopeless 0 0  PHQ - 2 Score 0 0    No Known Allergies Social History   Social History Narrative   Marital status/children/pets: Married, 1 child.   Education/employment: High school graduate, stay-at-home mom   Safety:      -Wears a bicycle helmet riding a bike: Yes     -smoke alarm in the home:Yes     - wears seatbelt: Yes     - Feels safe in their relationships: Yes   Past Medical History:  Diagnosis Date  . Allergic reaction  02/23/2020   Itchy mouth with watermelon and cucumber.  Refer to allergist.  . Allergic rhinitis   . Allergy   . Blood in stool 2010   Patient reports hospitalization for blood in stool, diarrhea and extreme dehydration.  She was hospitalized at this time and a colonoscopy was completed.  . Chicken pox   . 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 Frequent headaches   . GERD (gastroesophageal reflux disease)   . Heart murmur   . Hypertension   . Irregular menstrual bleeding 2015  . Osteoarthritis of knee 2016  . Patellofemoral pain syndrome 04/2015  . Tinea corporis   . Tonsil stone 2016  . Vitamin D deficiency    Past Surgical History:  Procedure Laterality Date  . 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 CESAREAN SECTION  2012  . WISDOM TOOTH EXTRACTION     Family History  Problem Relation Age of Onset  . Arthritis Mother   . Hyperlipidemia Mother   . Hypertension Mother   . Learning disabilities Mother   . Allergic rhinitis Mother   . Alcohol abuse Father   . Depression Father   . Asthma Brother   . Hyperlipidemia Brother   . Hypertension Brother   . Allergic rhinitis Brother   . Allergic rhinitis Son   . Eczema Son   . Urticaria Son   . Bladder Cancer Maternal Grandmother   . Early death Maternal Grandmother   . Skin cancer Maternal Grandfather   . Early death Maternal Grandfather   . Stroke Maternal Grandfather   .  Hypertension Maternal Grandfather   . Skin cancer Paternal Grandmother   . Skin cancer Paternal Grandfather   . Asthma Brother   . Hyperlipidemia Brother   . Hypertension Brother   . Allergic rhinitis Brother   . Breast cancer Neg Hx    Allergies as of 03/12/2021   No Known Allergies     Medication List       Accurate as of March 12, 2021  8:38 AM. If you have any questions, ask your nurse or doctor.        amLODipine 10 MG tablet Commonly known as: NORVASC Take 1 tablet (10 mg total) by mouth daily.   clindamycin 1 % lotion Commonly  known as: CLEOCIN T PLEASE SEE ATTACHED FOR DETAILED DIRECTIONS   metroNIDAZOLE 0.75 % cream Commonly known as: METROCREAM Apply topically 2 (two) times daily.   MULTI COMPLETE PO Take by mouth.   naproxen 500 MG tablet Commonly known as: Naprosyn Take 1 tablet (500 mg total) by mouth 2 (two) times daily with a meal.   Polysaccharide-Iron Complex 150 MG Caps One tab daily p.o.       All past medical history, surgical history, allergies, family history, immunizations andmedications were updated in the EMR today and reviewed under the history and medication portions of their EMR.     ROS: Negative, with the exception of above mentioned in HPI   Objective:  BP 117/77   Pulse 82   Temp 98.6 F (37 C) (Oral)   Wt 169 lb (76.7 kg)   SpO2 99%   BMI 31.93 kg/m  Body mass index is 31.93 kg/m. Gen: Afebrile. No acute distress. Nontoxic in appearance, well developed, well nourished.  HENT: AT. [place].  Eyes:Pupils Equal Round Reactive to light, Extraocular movements intact,  Conjunctiva without redness, discharge or icterus. Neck/lymp/endocrine: Supple,no lymphadenopathy CV: RRR no murmur, no edema Chest: CTAB, no wheeze or crackles. Good air movement, normal resp effort.  Skin: no rashes, purpura or petechiae.  Neuro:  Normal gait. PERLA. EOMi. Alert. Oriented x3  Psych: Normal affect, dress and demeanor. Normal speech. Normal thought content and judgment.  No exam data present No results found. No results found for this or any previous visit (from the past 24 hour(s)).  Assessment/Plan: Maria Goodwin is a 40 y.o. female present for OV for [facility] Primary hypertension/Obesity (BMI 30-39.9)/Palpitations Stable BP. Palpitations still occurring.  TSH collected today.  Low sodium  Continue amlodipine 10 mg QD  Next appt 6 mos CPE  Iron deficiency Improved last visit- however ferritin still a little low.  She is currently taking M, W, F- hopefully will be able to DC after today.  If not- refills prescribed and encourage to continue if abnormal or 1-3 months  if donates blood.  - Iron, TIBC and Ferritin Panel  Menorrhagia with regular cycle Better controlled.  Continue naproxen.    Reviewed expectations re: course of current medical issues.  Discussed self-management of symptoms.  Outlined signs and symptoms indicating need for more acute intervention.  Patient verbalized understanding and all questions were answered.  Patient received an After-Visit Summary.    Orders Placed This Encounter  Procedures  . Iron, TIBC and Ferritin Panel  . TSH   Meds ordered this encounter  Medications  . amLODipine (NORVASC) 10 MG tablet    Sig: Take 1 tablet (10 mg total) by mouth daily.    Dispense:  90 tablet    Refill:  1  . naproxen (NAPROSYN) 500 MG tablet  Sig: Take 1 tablet (500 mg total) by mouth 2 (two) times daily with a meal.    Dispense:  30 tablet    Refill:  3  . Polysaccharide-Iron Complex 150 MG CAPS    Sig: One tab daily p.o.    Dispense:  30 capsule    Refill:  5   Referral Orders  No referral(s) requested today     Note is dictated utilizing voice recognition software. Although note has been proof read prior to signing, occasional typographical errors still can be missed. If any questions arise, please do not hesitate to call for verification.   electronically signed by:  Howard Pouch, DO  [place]

## 2021-03-12 NOTE — Patient Instructions (Signed)
Great to see you today.   We will call with lab results.   Iron-Rich Diet  Iron is a mineral that helps your body to produce hemoglobin. Hemoglobin is a protein in red blood cells that carries oxygen to your body's tissues. Eating too little iron may cause you to feel weak and tired, and it can increase your risk of infection. Iron is naturally found in many foods, and many foods have iron added to them (iron-fortified foods). You may need to follow an iron-rich diet if you do not have enough iron in your body due to certain medical conditions. The amount of iron that you need each day depends on your age, your sex, and any medical conditions you have. Follow instructions from your health care provider or a diet and nutrition specialist (dietitian) about how much iron you should eat each day. What are tips for following this plan? Reading food labels  Check food labels to see how many milligrams (mg) of iron are in each serving. Cooking  Cook foods in pots and pans that are made from iron.  Take these steps to make it easier for your body to absorb iron from certain foods: ? Soak beans overnight before cooking. ? Soak whole grains overnight and drain them before using. ? Ferment flours before baking, such as by using yeast in bread dough. Meal planning  When you eat foods that contain iron, you should eat them with foods that are high in vitamin C. These include oranges, peppers, tomatoes, potatoes, and mango. Vitamin C helps your body to absorb iron. General information  Take iron supplements only as told by your health care provider. An overdose of iron can be life-threatening. If you were prescribed iron supplements, take them with orange juice or a vitamin C supplement.  When you eat iron-fortified foods or take an iron supplement, you should also eat foods that naturally contain iron, such as meat, poultry, and fish. Eating naturally iron-rich foods helps your body to absorb the iron  that is added to other foods or contained in a supplement.  Certain foods and drinks prevent your body from absorbing iron properly. Avoid eating these foods in the same meal as iron-rich foods or with iron supplements. These foods include: ? Coffee, black tea, and red wine. ? Milk, dairy products, and foods that are high in calcium. ? Beans and soybeans. ? Whole grains. What foods should I eat? Fruits Prunes. Raisins. Eat fruits high in vitamin C, such as oranges, grapefruits, and strawberries, alongside iron-rich foods. Vegetables Spinach (cooked). Green peas. Broccoli. Fermented vegetables. Eat vegetables high in vitamin C, such as leafy greens, potatoes, bell peppers, and tomatoes, alongside iron-rich foods. Grains Iron-fortified breakfast cereal. Iron-fortified whole-wheat bread. Enriched rice. Sprouted grains. Meats and other proteins Beef liver. Oysters. Beef. Shrimp. [place]. Chicken. [place]. Sardines. Chickpeas. Nuts. Tofu. Pumpkin seeds. Beverages Tomato juice. Fresh orange juice. Prune juice. Hibiscus tea. Fortified instant breakfast shakes. Sweets and desserts Blackstrap molasses. Seasonings and condiments Tahini. Fermented soy sauce. Other foods Wheat germ. The items listed above may not be a complete list of recommended foods and beverages. Contact a dietitian for more information. What foods should I avoid? Grains Whole grains. Bran cereal. Bran flour. Oats. Meats and other proteins Soybeans. Products made from soy protein. Black beans. Lentils. Mung beans. Split peas. Dairy Milk. Cream. Cheese. Yogurt. Cottage cheese. Beverages Coffee. Black tea. Red wine. Sweets and desserts Cocoa. Chocolate. Ice cream. Other foods Basil. Oregano. Large amounts of parsley. The  items listed above may not be a complete list of foods and beverages to avoid. Contact a dietitian for more information. Summary  Iron is a mineral that helps your body to produce hemoglobin. Hemoglobin  is a protein in red blood cells that carries oxygen to your body's tissues.  Iron is naturally found in many foods, and many foods have iron added to them (iron-fortified foods).  When you eat foods that contain iron, you should eat them with foods that are high in vitamin C. Vitamin C helps your body to absorb iron.  Certain foods and drinks prevent your body from absorbing iron properly, such as whole grains and dairy products. You should avoid eating these foods in the same meal as iron-rich foods or with iron supplements. This information is not intended to replace advice given to you by your health care provider. Make sure you discuss any questions you have with your health care provider. Document Revised: 10/17/2017 Document Reviewed: 09/30/2017 Elsevier Patient Education  2021 [employer].

## 2021-03-19 ENCOUNTER — Ambulatory Visit (INDEPENDENT_AMBULATORY_CARE_PROVIDER_SITE_OTHER): Payer: Federal, State, Local not specified - PPO

## 2021-03-19 DIAGNOSIS — R202 Paresthesia of skin: Secondary | ICD-10-CM

## 2021-03-19 DIAGNOSIS — R002 Palpitations: Secondary | ICD-10-CM

## 2021-03-19 DIAGNOSIS — R200 Anesthesia of skin: Secondary | ICD-10-CM | POA: Diagnosis not present

## 2021-03-19 DIAGNOSIS — D649 Anemia, unspecified: Secondary | ICD-10-CM

## 2021-03-20 ENCOUNTER — Telehealth: Payer: Self-pay

## 2021-03-20 DIAGNOSIS — R002 Palpitations: Secondary | ICD-10-CM

## 2021-03-20 LAB — IRON,TIBC AND FERRITIN PANEL
%SAT: 33 % (calc) (ref 16–45)
Ferritin: 19 ng/mL (ref 16–232)
Iron: 102 ug/dL (ref 40–190)
TIBC: 311 mcg/dL (calc) (ref 250–450)

## 2021-03-20 NOTE — Telephone Encounter (Signed)
Yes. Referred to cardiology. They will call her to schedule concerning her palpitations.

## 2021-03-20 NOTE — Telephone Encounter (Signed)
Spoke with pt regarding labs and instructions. Pt had question referral to cardiology since labs were normal

## 2021-03-20 NOTE — Telephone Encounter (Signed)
-----   Message from [name], DO sent at 03/20/2021  7:26 AM EDT ----- Please inform patient the following information: Her iron levels are improved and in low normal range for ferritin.  Since she is in the low normal range for ferritin/iron stores I would recommend she continue the iron 2-3 times a week for another 1-2 months to allow her to build iron stores, and then she can discontinue it if desired.

## 2021-03-20 NOTE — Addendum Note (Signed)
Addended by: Howard Pouch A on: 03/20/2021 10:42 AM   Modules accepted: Orders

## 2021-04-12 ENCOUNTER — Ambulatory Visit: Payer: Federal, State, Local not specified - PPO | Admitting: Cardiology

## 2021-04-16 ENCOUNTER — Ambulatory Visit (HOSPITAL_COMMUNITY)
Admission: RE | Admit: 2021-04-16 | Discharge: 2021-04-16 | Disposition: A | Discharge status: Home / Self Care | Payer: Federal, State, Local not specified - PPO | Source: Ambulatory Visit | Attending: Physician Assistant | Admitting: Physician Assistant

## 2021-04-16 ENCOUNTER — Other Ambulatory Visit: Payer: Self-pay

## 2021-04-16 ENCOUNTER — Encounter (HOSPITAL_COMMUNITY): Payer: Self-pay

## 2021-04-16 VITALS — BP 122/83 | HR 77 | Temp 97.7°F | Resp 17

## 2021-04-16 DIAGNOSIS — R300 Dysuria: Secondary | ICD-10-CM | POA: Insufficient documentation

## 2021-04-16 DIAGNOSIS — N3001 Acute cystitis with hematuria: Secondary | ICD-10-CM | POA: Insufficient documentation

## 2021-04-16 DIAGNOSIS — R350 Frequency of micturition: Secondary | ICD-10-CM | POA: Insufficient documentation

## 2021-04-16 DIAGNOSIS — B9629 Other Escherichia coli [E. coli] as the cause of diseases classified elsewhere: Secondary | ICD-10-CM | POA: Diagnosis not present

## 2021-04-16 LAB — POCT URINALYSIS DIPSTICK, ED / UC
Bilirubin Urine: NEGATIVE
Glucose, UA: NEGATIVE mg/dL
Ketones, ur: NEGATIVE mg/dL
Nitrite: NEGATIVE
Protein, ur: NEGATIVE mg/dL
Specific Gravity, Urine: <=1.005 (ref 1.005–1.030)
Urobilinogen, UA: 0.2 mg/dL (ref 0.0–1.0)
pH: 5 (ref 5.0–8.0)

## 2021-04-16 MED ORDER — NITROFURANTOIN MONOHYD MACRO 100 MG PO CAPS: 100.0000 mg | ORAL_CAPSULE | Freq: Two times a day (BID) | ORAL | 0 refills | Status: DC

## 2021-04-16 NOTE — ED Provider Notes (Signed)
[place]    CSN: [id number] Arrival date & time: 04/16/21  1635      History   Chief Complaint Chief Complaint  Patient presents with  . Appointment    1700  . Dysuria    HPI Maria Goodwin is a 40 y.o. female.   Patient presents today with a 2-day history of dysuria.  Reports associated urinary frequency.  Denies urinary urgency, hematuria, abdominal pain, nausea, vomiting, fever, vaginal symptoms.  She has tried Naprosyn without improvement of symptoms.  She denies history of UTI and has not seen a urologist in the past.  She denies history of nephrolithiasis.  Denies recent urogenital procedure or self-catheterization.  She denies any recent antibiotic use.  She has no concern for pregnancy.     Past Medical History:  Diagnosis Date  . Allergic reaction 02/23/2020   Itchy mouth with watermelon and cucumber.  Refer to allergist.  . Allergic rhinitis   . Allergy   . Blood in stool 2010   Patient reports hospitalization for blood in stool, diarrhea and extreme dehydration.  She was hospitalized at this time and a colonoscopy was completed.  . Chicken pox   . 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 Frequent headaches   . GERD (gastroesophageal reflux disease)   . Heart murmur   . Hypertension   . Irregular menstrual bleeding 2015  . Osteoarthritis of knee 2016  . Patellofemoral pain syndrome 04/2015  . Tinea corporis   . Tonsil stone 2016  . Vitamin D deficiency     Patient Active Problem List   Diagnosis Date Noted  . Abnormal mammogram 12/28/2020  . Palpitations 06/13/2020  . Severe menstrual cramps 06/13/2020  . Menorrhagia with regular cycle 06/13/2020  . Anemia 06/13/2020  . Other allergic rhinitis 03/23/2020  . Pollen-food allergy 03/23/2020  . Allergic conjunctivitis of both eyes 03/23/2020  . Seasonal allergies 02/23/2020  . Numbness and tingling of upper extremity 02/23/2020  . Hypertension 02/22/2020  . Obesity (BMI 30-39.9) 02/22/2020     Past Surgical History:  Procedure Laterality Date  . 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 CESAREAN SECTION  2012  . WISDOM TOOTH EXTRACTION      OB History    Gravida  1   Para  1   Term      Preterm      AB      Living  1     SAB      IAB      Ectopic      Multiple      Live Births               Home Medications    Prior to Admission medications   Medication Sig Start Date End Date Taking? Authorizing Provider  Fexofenadine-Pseudoephedrine (ALLEGRA-D 24 HOUR PO) Take by mouth.   Yes [provider]  nitrofurantoin, macrocrystal-monohydrate, (MACROBID) 100 MG capsule Take 1 capsule (100 mg total) by mouth 2 (two) times daily. 04/16/21  Yes Shi Blankenship K, PA-C  amLODipine (NORVASC) 10 MG tablet Take 1 tablet (10 mg total) by mouth daily. 03/12/21   Kuneff, Renee A, DO  metroNIDAZOLE (METROCREAM) 0.75 % cream Apply topically 2 (two) times daily. 02/06/21   [provider]  Multiple Vitamins-Minerals (MULTI COMPLETE PO) Take by mouth.    [provider]  naproxen (NAPROSYN) 500 MG tablet Take 1 tablet (500 mg total) by mouth 2 (two) times daily with a meal. 03/12/21  Raoul Pitch, Renee A, DO  Polysaccharide-Iron Complex 150 MG CAPS One tab daily p.o. 03/12/21   Raoul Pitch, Renee A, DO    Family History Family History  Problem Relation Age of Onset  . Arthritis Mother   . Hyperlipidemia Mother   . Hypertension Mother   . Learning disabilities Mother   . Allergic rhinitis Mother   . Alcohol abuse Father   . Depression Father   . Asthma Brother   . Hyperlipidemia Brother   . Hypertension Brother   . Allergic rhinitis Brother   . Allergic rhinitis Son   . Eczema Son   . Urticaria Son   . Bladder Cancer Maternal Grandmother   . Early death Maternal Grandmother   . Skin cancer Maternal Grandfather   . Early death Maternal Grandfather   . Stroke Maternal Grandfather   . Hypertension Maternal Grandfather   . Skin cancer  Paternal Grandmother   . Skin cancer Paternal Grandfather   . Asthma Brother   . Hyperlipidemia Brother   . Hypertension Brother   . Allergic rhinitis Brother   . Breast cancer Neg Hx     Social History Social History   Tobacco Use  . Smoking status: Never Smoker  . Smokeless tobacco: Never Used  Vaping Use  . Vaping Use: Never used  Substance Use Topics  . Alcohol use: Yes    Comment: socially   . Drug use: Never     Allergies   Patient has no known allergies.   Review of Systems Review of Systems  Constitutional: Negative for activity change, appetite change, fatigue and fever.  Respiratory: Negative for cough and shortness of breath.   Cardiovascular: Negative for chest pain.  Gastrointestinal: Negative for abdominal pain, diarrhea, nausea and vomiting.  Genitourinary: Positive for dysuria and frequency. Negative for flank pain, hematuria, pelvic pain, urgency, vaginal bleeding, vaginal discharge and vaginal pain.  Musculoskeletal: Negative for arthralgias, back pain and myalgias.  Neurological: Negative for dizziness, light-headedness and headaches.     Physical Exam Triage Vital Signs ED Triage Vitals  Enc Vitals Group     BP 04/16/21 1739 122/83     Pulse Rate 04/16/21 1739 77     Resp 04/16/21 1739 17     Temp 04/16/21 1739 97.7 F (36.5 C)     Temp Source 04/16/21 1739 Oral     SpO2 04/16/21 1739 100 %     Weight --      Height --      Head Circumference --      Peak Flow --      Pain Score 04/16/21 1736 0     Pain Loc --      Pain Edu? --      Excl. in [place]? --    No data found.  Updated Vital Signs BP 122/83 (BP Location: Right Arm)   Pulse 77   Temp 97.7 F (36.5 C) (Oral)   Resp 17   LMP  (Within Weeks) Comment: 2 weeks  SpO2 100%   Visual Acuity Right Eye Distance:   Left Eye Distance:   Bilateral Distance:    Right Eye Near:   Left Eye Near:    Bilateral Near:     Physical Exam Vitals reviewed.  Constitutional:       General: She is awake. She is not in acute distress.    Appearance: Normal appearance. She is normal weight. She is not ill-appearing.     Comments: Very pleasant female appears stated age  in no acute distress  HENT:     Head: Normocephalic and atraumatic.  Cardiovascular:     Rate and Rhythm: Normal rate and regular rhythm.     Heart sounds: Normal heart sounds. No murmur heard.   Pulmonary:     Effort: Pulmonary effort is normal.     Breath sounds: Normal breath sounds. No wheezing, rhonchi or rales.     Comments: Clear to auscultation bilaterally Abdominal:     General: Bowel sounds are normal.     Palpations: Abdomen is soft.     Tenderness: There is no abdominal tenderness. There is no right CVA tenderness, left CVA tenderness, guarding or rebound.     Comments: Benign abdominal exam; no tenderness palpation.  No CVA tenderness.  Musculoskeletal:     Cervical back: No tenderness or bony tenderness.     Thoracic back: No tenderness or bony tenderness.     Lumbar back: No tenderness or bony tenderness.     Right lower leg: No edema.     Left lower leg: No edema.  Psychiatric:        Behavior: Behavior is cooperative.      UC Treatments / Results  Labs (all labs ordered are listed, but only abnormal results are displayed) Labs Reviewed  POCT URINALYSIS DIPSTICK, ED / UC - Abnormal; Notable for the following components:      Result Value   Hgb urine dipstick MODERATE (*)    Leukocytes,Ua MODERATE (*)    All other components within normal limits  URINE CULTURE    EKG   Radiology No results found.  Procedures Procedures (including critical care time)  Medications Ordered in UC Medications - No data to display  Initial Impression / Assessment and Plan / UC Course  I have reviewed the triage vital signs and the nursing notes.  Pertinent labs & imaging results that were available during my care of the patient were reviewed by me and considered in my medical  decision making (see chart for details).     UA showed evidence of infection.  Given clinical presentation and abnormal UA patient was started on Macrobid.  Urine culture was obtained and discussed potential need to change antibiotics based on susceptibilities identified on culture.  Recommend she drink plenty of fluid.  She can use over-the-counter medication for symptom relief.  Discussed alarm symptoms that warrant emergent evaluation.  Strict return precautions given to which patient expressed understanding.  Final Clinical Impressions(s) / UC Diagnoses   Final diagnoses:  Acute cystitis with hematuria  Dysuria  Urinary frequency     Discharge Instructions     Prescribed Macrobid to cover for urinary tract infection.  Take this twice a day for 5 days.  I have sent your urine off for culture and if any change antibiotics we will contact you.  You can continue over-the-counter medications for pain relief.  Make sure you are drinking plenty of fluid.  If you have any worsening symptoms including fever, abdominal pain, nausea, vomiting, blood in your urine you need to be reevaluated immediately.  I would recommend we repeat your urine in 4 weeks.  This can be done with your PCP.    ED Prescriptions    Medication Sig Dispense Auth. Provider   nitrofurantoin, macrocrystal-monohydrate, (MACROBID) 100 MG capsule Take 1 capsule (100 mg total) by mouth 2 (two) times daily. 10 capsule Kynlee Koenigsberg, Derry Skill, PA-C     PDMP not reviewed this encounter.   Lunah Losasso, Derry Skill, PA-C  04/16/21 1756  

## 2021-04-16 NOTE — Discharge Instructions (Addendum)
Prescribed Macrobid to cover for urinary tract infection.  Take this twice a day for 5 days.  I have sent your urine off for culture and if any change antibiotics we will contact you.  You can continue over-the-counter medications for pain relief.  Make sure you are drinking plenty of fluid.  If you have any worsening symptoms including fever, abdominal pain, nausea, vomiting, blood in your urine you need to be reevaluated immediately.  I would recommend we repeat your urine in 4 weeks.  This can be done with your PCP.

## 2021-04-16 NOTE — ED Triage Notes (Signed)
Pt reports burning sensation when urinating x 2 days.

## 2021-04-19 LAB — URINE CULTURE: Culture: 20000 — AB

## 2021-05-18 ENCOUNTER — Ambulatory Visit: Payer: Federal, State, Local not specified - PPO | Admitting: Cardiovascular Disease

## 2021-05-18 ENCOUNTER — Other Ambulatory Visit: Payer: Self-pay

## 2021-05-18 ENCOUNTER — Encounter: Payer: Self-pay | Admitting: Cardiovascular Disease

## 2021-05-18 VITALS — BP 122/72 | HR 75 | Ht 61.0 in | Wt 169.0 lb

## 2021-05-18 DIAGNOSIS — I10 Essential (primary) hypertension: Secondary | ICD-10-CM

## 2021-05-18 DIAGNOSIS — R002 Palpitations: Secondary | ICD-10-CM | POA: Diagnosis not present

## 2021-05-18 DIAGNOSIS — G4733 Obstructive sleep apnea (adult) (pediatric): Secondary | ICD-10-CM | POA: Insufficient documentation

## 2021-05-18 NOTE — Assessment & Plan Note (Signed)
History of nocturnal snoring and daytime somnolence.  She is mildly overweight.  She may have a component of obstructive sleep apnea that could be contributing to her palpitations.  We will continue to monitor at this time although she may need a formal sleep study.

## 2021-05-18 NOTE — Progress Notes (Signed)
05/18/2021 [name]   17-Feb-1981  [id number]  Primary Physician [name], DO Primary Cardiologist: Lorretta Harp MD Lupe Carney, [place]  HPI:  Maria Goodwin is a 40 y.o. 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  She was referred by Dr. Raoul Pitch, her PCP, for evaluation of palpitations.  Her only cardiac risk factor is treated hypertension.  She does have a family history of heart disease and she thinks her father had a myocardial infarction function although she is not sure.  She is never had a heart attack or stroke.  She denies chest pain although she does get occasional reflux type symptoms and does complain of some dyspnea on exertion.  She is very active and hikes and does yard work.  She does get palpitations which she has had for years occurring on a monthly basis lasting seconds at a time.  She gets random episodes of dizziness as well.   Current Meds  Medication Sig   amLODipine (NORVASC) 10 MG tablet Take 1 tablet (10 mg total) by mouth daily.   Fexofenadine-Pseudoephedrine (ALLEGRA-D 24 HOUR PO) Take by mouth.   metroNIDAZOLE (METROCREAM) 0.75 % cream Apply topically 2 (two) times daily.   Multiple Vitamins-Minerals (MULTI COMPLETE PO) Take by mouth.   naproxen (NAPROSYN) 500 MG tablet Take 1 tablet (500 mg total) by mouth 2 (two) times daily with a meal.   nitrofurantoin, macrocrystal-monohydrate, (MACROBID) 100 MG capsule Take 1 capsule (100 mg total) by mouth 2 (two) times daily.   Polysaccharide-Iron Complex 150 MG CAPS One tab daily p.o.     No Known Allergies  Social History   Socioeconomic History   Marital status: Married    Spouse name: Not on file   Number of children: Not on file   Years of education: Not on file   Highest education level: Not on file  Occupational History   Not on file  Tobacco Use   Smoking status: Never   Smokeless tobacco: Never  Vaping Use   Vaping Use: Never  used  Substance and Sexual Activity   Alcohol use: Yes    Comment: socially    Drug use: Never   Sexual activity: Yes    Partners: Male  Other Topics Concern   Not on file  Social History Narrative   Marital status/children/pets: Married, 1 child.   Education/employment: High school graduate, stay-at-home mom   Safety:      -Wears a bicycle helmet riding a bike: Yes     -smoke alarm in the home:Yes     - wears seatbelt: Yes     - Feels safe in their relationships: Yes   Social Determinants of Health   Financial Resource Strain: Not on file  Food Insecurity: Not on file  Transportation Needs: Not on file  Physical Activity: Not on file  Stress: Not on file  Social Connections: Not on file  Intimate Partner Violence: Not on file     Review of Systems: General: negative for chills, fever, night sweats or weight changes.  Cardiovascular: negative for chest pain, dyspnea on exertion, edema, orthopnea, palpitations, paroxysmal nocturnal dyspnea or shortness of breath Dermatological: negative for rash Respiratory: negative for cough or wheezing Urologic: negative for hematuria Abdominal: negative for nausea, vomiting, diarrhea, bright red blood per rectum, melena, or hematemesis Neurologic: negative for visual changes, syncope, or dizziness All other systems reviewed and are otherwise negative except as noted above.  Blood pressure 122/72, pulse 75, height 5\' 1"  (1.549 m), weight 169 lb (76.7 kg), SpO2 98 %.  General appearance: alert Neck: no adenopathy, no carotid bruit, no JVD, supple, symmetrical, trachea midline, and thyroid not enlarged, symmetric, no tenderness/mass/nodules Lungs: clear to auscultation bilaterally Heart: regular rate and rhythm, S1, S2 normal, no murmur, click, rub or gallop Extremities: extremities normal, atraumatic, no cyanosis or edema Pulses: 2+ and symmetric Skin: Skin color, texture, turgor normal. No rashes or lesions Neurologic: Grossly  normal  EKG sinus rhythm at 75 without ST or T wave changes.  Personally reviewed this EKG.  ASSESSMENT AND PLAN:   Hypertension History of essential hypertension with blood pressure measured at 122/72.  She is on amlodipine.  Palpitations History of palpitations for many years occurring on a monthly basis lasting seconds at a time.  There is no particular provocation.  Her PCP did apparently order thyroid function tests which were normal.  She does drink 2 to 3 cups of caffeinated beverages a day.  We did talk about limiting her caffeine intake as well.  She is not under undue stress.  She does complain of some dyspnea on exertion.  I am going to get a 2D echocardiogram and a 30-day event monitor to further evaluate  Obstructive sleep apnea History of nocturnal snoring and daytime somnolence.  She is mildly overweight.  She may have a component of obstructive sleep apnea that could be contributing to her palpitations.  We will continue to monitor at this time although she may need a formal sleep study.     Lorretta Harp MD [place], [facility] 05/18/2021 8:17 AM

## 2021-05-18 NOTE — Assessment & Plan Note (Signed)
History of essential hypertension with blood pressure measured at 122/72.  She is on amlodipine.

## 2021-05-18 NOTE — Patient Instructions (Signed)
  Testing/Procedures:  Your physician has requested that you have an echocardiogram. Echocardiography is a painless test that uses sound waves to create images of your heart. It provides your doctor with information about the size and shape of your heart and how well your heart's chambers and valves are working. This procedure takes approximately one hour. There are no restrictions for this procedure. [facility] has recommended that you wear a 30 DAY event monitor. Event monitors are medical devices that record the heart's electrical activity. Doctors most often [place] these monitors to diagnose arrhythmias. Arrhythmias are problems with the speed or rhythm of the heartbeat. The monitor is a small, portable device. You can wear one while you do your normal daily activities. This is usually used to diagnose what is causing palpitations/syncope (passing out).    Follow-Up: At [facility], you and your health needs are our priority.  As part of our continuing mission to provide you with exceptional heart care, we have created designated Provider Care Teams.  These Care Teams include your primary Cardiologist (physician) and Advanced Practice Providers (APPs -  Physician Assistants and Nurse Practitioners) who all work together to provide you with the care you need, when you need it.  We recommend signing up for the patient portal called "MyChart".  Sign up information is provided on this After Visit Summary.  MyChart is used to connect with patients for Virtual Visits (Telemedicine).  Patients are able to view lab/test results, encounter notes, upcoming appointments, etc.  Non-urgent messages can be sent to your provider as well.   To learn more about what you can do with MyChart, go to [email or web address].    Your next appointment:   3 month(s)  The format for your next appointment:   In Person  Provider:   Quay Burow, MD

## 2021-05-18 NOTE — Assessment & Plan Note (Addendum)
History of palpitations for many years occurring on a monthly basis lasting seconds at a time.  There is no particular provocation.  Her PCP did apparently order thyroid function tests which were normal.  She does drink 2 to 3 cups of caffeinated beverages a day.  We did talk about limiting her caffeine intake as well.  She is not under undue stress.  She does complain of some dyspnea on exertion.  I am going to get a 2D echocardiogram and a 30-day event monitor to further evaluate

## 2021-05-23 ENCOUNTER — Ambulatory Visit (INDEPENDENT_AMBULATORY_CARE_PROVIDER_SITE_OTHER): Payer: Federal, State, Local not specified - PPO

## 2021-05-23 DIAGNOSIS — I10 Essential (primary) hypertension: Secondary | ICD-10-CM

## 2021-05-23 DIAGNOSIS — R002 Palpitations: Secondary | ICD-10-CM | POA: Diagnosis not present

## 2021-06-04 ENCOUNTER — Other Ambulatory Visit: Payer: Self-pay

## 2021-06-04 ENCOUNTER — Ambulatory Visit (HOSPITAL_COMMUNITY): Payer: Federal, State, Local not specified - PPO | Attending: Internal Medicine

## 2021-06-04 ENCOUNTER — Other Ambulatory Visit: Payer: Self-pay | Admitting: Family Medicine

## 2021-06-04 DIAGNOSIS — R002 Palpitations: Secondary | ICD-10-CM | POA: Diagnosis not present

## 2021-06-04 DIAGNOSIS — I10 Essential (primary) hypertension: Secondary | ICD-10-CM | POA: Diagnosis not present

## 2021-06-04 DIAGNOSIS — Z09 Encounter for follow-up examination after completed treatment for conditions other than malignant neoplasm: Secondary | ICD-10-CM

## 2021-06-04 LAB — ECHOCARDIOGRAM COMPLETE
Area-P 1/2: 3.6 cm2
S' Lateral: 2.5 cm

## 2021-07-17 ENCOUNTER — Other Ambulatory Visit: Payer: Federal, State, Local not specified - PPO

## 2021-08-06 ENCOUNTER — Ambulatory Visit
Admission: RE | Admit: 2021-08-06 | Discharge: 2021-08-06 | Disposition: A | Discharge status: Home / Self Care | Payer: Federal, State, Local not specified - PPO | Source: Ambulatory Visit | Attending: Family Medicine | Admitting: Family Medicine

## 2021-08-06 ENCOUNTER — Other Ambulatory Visit: Payer: Self-pay | Admitting: Family Medicine

## 2021-08-06 ENCOUNTER — Other Ambulatory Visit: Payer: Self-pay

## 2021-08-06 DIAGNOSIS — N6323 Unspecified lump in the left breast, lower outer quadrant: Secondary | ICD-10-CM | POA: Diagnosis not present

## 2021-08-06 DIAGNOSIS — N6320 Unspecified lump in the left breast, unspecified quadrant: Secondary | ICD-10-CM

## 2021-08-06 DIAGNOSIS — Z09 Encounter for follow-up examination after completed treatment for conditions other than malignant neoplasm: Secondary | ICD-10-CM

## 2021-08-06 DIAGNOSIS — N6321 Unspecified lump in the left breast, upper outer quadrant: Secondary | ICD-10-CM | POA: Diagnosis not present

## 2021-08-28 ENCOUNTER — Other Ambulatory Visit: Payer: Self-pay

## 2021-08-28 ENCOUNTER — Ambulatory Visit: Payer: Federal, State, Local not specified - PPO | Admitting: Cardiovascular Disease

## 2021-08-28 ENCOUNTER — Encounter: Payer: Self-pay | Admitting: Cardiovascular Disease

## 2021-08-28 VITALS — BP 126/70 | HR 94 | Ht 61.0 in | Wt 174.2 lb

## 2021-08-28 DIAGNOSIS — R002 Palpitations: Secondary | ICD-10-CM

## 2021-08-28 NOTE — Patient Instructions (Signed)
Medication Instructions:  Your physician recommends that you continue on your current medications as directed. Please refer to the Current Medication list given to you today.  *If you need a refill on your cardiac medications before your next appointment, please call your pharmacy*   Follow-Up: At [facility], you and your health needs are our priority.  As part of our continuing mission to provide you with exceptional heart care, we have created designated Provider Care Teams.  These Care Teams include your primary Cardiologist (physician) and Advanced Practice Providers (APPs -  Physician Assistants and Nurse Practitioners) who all work together to provide you with the care you need, when you need it.  We recommend signing up for the patient portal called "MyChart".  Sign up information is provided on this After Visit Summary.  MyChart is used to connect with patients for Virtual Visits (Telemedicine).  Patients are able to view lab/test results, encounter notes, upcoming appointments, etc.  Non-urgent messages can be sent to your provider as well.   To learn more about what you can do with MyChart, go to [email or web address].    Your next appointment:   We will see you on an as needed basis. If you need to schedule an appointment please call our office.  Provider:   Quay Burow, MD

## 2021-08-28 NOTE — Progress Notes (Signed)
Ms. Mickelson returns today for follow-up of her noninvasive studies ordered for the work-up of palpitations.  Her 2D echo was essentially normal and a 30-day event monitor showed only occasional PACs and PVCs.  Her palpitations have significantly reduced in frequency and severity since I saw her 3 months ago.  Her TSH with is within normal limits.  She has decreased her coffee intake by changing to decaf.  Her LDL is 110 by fasting lipid profile 1 year ago.  No further work-up is necessary.  I will see her back as needed.  Lorretta Harp, M.D., [place], [facility], [place] [address]. [place], [place]  [address]  [phone] 08/28/2021 8:25 AM

## 2021-09-19 ENCOUNTER — Encounter: Payer: Federal, State, Local not specified - PPO | Admitting: Family Medicine

## 2021-10-15 ENCOUNTER — Encounter: Payer: Self-pay | Admitting: Family Medicine

## 2021-10-15 ENCOUNTER — Ambulatory Visit (INDEPENDENT_AMBULATORY_CARE_PROVIDER_SITE_OTHER): Payer: Federal, State, Local not specified - PPO | Admitting: Family Medicine

## 2021-10-15 ENCOUNTER — Other Ambulatory Visit: Payer: Self-pay

## 2021-10-15 VITALS — BP 118/79 | HR 85 | Temp 98.0°F | Ht 61.0 in | Wt 174.0 lb

## 2021-10-15 DIAGNOSIS — I10 Essential (primary) hypertension: Secondary | ICD-10-CM | POA: Diagnosis not present

## 2021-10-15 DIAGNOSIS — R002 Palpitations: Secondary | ICD-10-CM | POA: Diagnosis not present

## 2021-10-15 DIAGNOSIS — Z0000 Encounter for general adult medical examination without abnormal findings: Secondary | ICD-10-CM

## 2021-10-15 DIAGNOSIS — E559 Vitamin D deficiency, unspecified: Secondary | ICD-10-CM | POA: Diagnosis not present

## 2021-10-15 DIAGNOSIS — Z131 Encounter for screening for diabetes mellitus: Secondary | ICD-10-CM

## 2021-10-15 DIAGNOSIS — E611 Iron deficiency: Secondary | ICD-10-CM | POA: Diagnosis not present

## 2021-10-15 MED ORDER — NAPROXEN 500 MG PO TABS: 500.0000 mg | ORAL_TABLET | Freq: Two times a day (BID) | ORAL | 5 refills | Status: DC | PRN

## 2021-10-15 MED ORDER — AMLODIPINE BESYLATE 10 MG PO TABS: 10.0000 mg | ORAL_TABLET | Freq: Every day | ORAL | 1 refills | Status: DC

## 2021-10-15 NOTE — Patient Instructions (Signed)

## 2021-10-15 NOTE — Progress Notes (Signed)
This visit occurred during the SARS-CoV-2 public health emergency.  Safety protocols were in place, including screening questions prior to the visit, additional usage of staff PPE, and extensive cleaning of exam room while observing appropriate contact time as indicated for disinfecting solutions.    Patient ID: Maria Goodwin, female  DOB: 1981-03-28, 40 y.o.   MRN: [id number] Patient Care Team    Relationship Specialty Notifications Start End  [name], DO PCP - General Family Medicine  02/22/20     Chief Complaint  Patient presents with   Annual Exam    Pt is fasting    Subjective: Maria Goodwin is a 40 y.o.  Female  present for CPE/cmc All past medical history, surgical history, allergies, family history, immunizations, medications and social history were updated in the electronic medical record today. All recent labs, ED visits and hospitalizations within the last year were reviewed.  Health maintenance:  Colonoscopy: routine screen at 56 Mammogram: completed:2022 > has scheduled 02/04/2022 Cervical cancer screening: last pap: 2021> pcp wnl-hpv- 5 yr Immunizations: tdap UTD 2017, Influenza UTD 2022(encouraged yearly), covid pfizer series completed.  Infectious disease screening: HIV completed, Hep C completed DEXA: routine screen Assistive device: none Oxygen DQQ:IWLN Patient has a Dental home. Hospitalizations/ED visits: reviewed  Menstrual cramps/menorrhagia/IDA: cramps ad bleeding pattern greatly improved on naproxen. She takes naproxen a few days prior to menses and through menses. Currently taking iron M,W,F and tolerating.    Hypertension/obesity/palpitations:  Pt reports compliance with amlodipine 10 mg daily. Blood pressures ranges at home routinely checked. Patient denies chest pain, shortness of breath, dizziness or lower extremity edema.  She reports the palpitations have greatly improved. Pt is not taking a daily baby ASA. Pt is not prescribed a prescribed  statin. RF: Hypertension, obesity, family history of hypertension, hyperlipidemia and stroke  Depression screen [facility] 2/9 10/15/2021 09/14/2020 02/22/2020  Decreased Interest 0 0 0  Down, Depressed, Hopeless 0 0 0  PHQ - 2 Score 0 0 0   No flowsheet data found.   Immunization History  Administered Date(s) Administered   Hepatitis A 08/15/2016, 02/11/2017   Hepatitis B 01/31/1987   Influenza,inj,Quad PF,6+ Mos 07/21/2021   Influenza-Unspecified 09/07/2019, 08/02/2020   PFIZER(Purple Top)SARS-COV-2 Vaccination 02/08/2020, 02/29/2020, 09/15/2020   Pfizer Covid-19 Vaccine Bivalent Booster 5yrs & up 08/09/2021   Tdap 08/15/2016     Past Medical History:  Diagnosis Date   Allergic reaction 02/23/2020   Itchy mouth with watermelon and cucumber.  Refer to allergist.   Allergic rhinitis    Allergy    Anemia 2021   Anxiety Since childhood   Blood in stool 2010   Patient reports hospitalization for blood in stool, diarrhea and extreme dehydration.  She was hospitalized at this time and a colonoscopy was completed.   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   Frequent headaches    GERD (gastroesophageal reflux disease)    Heart murmur    Hypertension    Iron deficiency anemia    Irregular menstrual bleeding 2015   Osteoarthritis of knee 2016   Patellofemoral pain syndrome 04/2015   Tinea corporis    Tonsil stone 2016   Vitamin D deficiency    No Known Allergies Past Surgical History:  Procedure Laterality Date   BREAST BIOPSY Left 08/2016   fibroadenoma   BREAST BIOPSY Left 01/15/2021   CESAREAN SECTION  2012   WISDOM TOOTH EXTRACTION     Family History  Problem Relation Age of Onset  Arthritis Mother    Hyperlipidemia Mother    Hypertension Mother    Learning disabilities Mother    Allergic rhinitis Mother    Alcohol abuse Father    Depression Father    Vision loss Father    Asthma Brother    Hyperlipidemia Brother    Hypertension Brother    Allergic  rhinitis Brother    Allergic rhinitis Son    Eczema Son    Urticaria Son    Bladder Cancer Maternal Grandmother    Early death Maternal Grandmother    Skin cancer Maternal Grandfather    Early death Maternal Grandfather    Stroke Maternal Grandfather    Hypertension Maternal Grandfather    Skin cancer Paternal Grandmother    Skin cancer Paternal Grandfather    Asthma Brother    Hyperlipidemia Brother    Hypertension Brother    Allergic rhinitis Brother    Breast cancer Neg Hx    Social History   Social History Narrative   Marital status/children/pets: Married, 1 child.   Education/employment: High school graduate, stay-at-home mom   Safety:      -Wears a bicycle helmet riding a bike: Yes     -smoke alarm in the home:Yes     - wears seatbelt: Yes     - Feels safe in their relationships: Yes    Allergies as of 10/15/2021   No Known Allergies      Medication List        Accurate as of October 15, 2021  5:00 PM. If you have any questions, ask your nurse or doctor.          ALLEGRA-D 24 HOUR PO Take by mouth.   amLODipine 10 MG tablet Commonly known as: NORVASC Take 1 tablet (10 mg total) by mouth daily.   metroNIDAZOLE 0.75 % cream Commonly known as: METROCREAM Apply topically 2 (two) times daily.   MULTI COMPLETE PO Take by mouth.   naproxen 500 MG tablet Commonly known as: Naprosyn Take 1 tablet (500 mg total) by mouth 2 (two) times daily as needed for mild pain.        All past medical history, surgical history, allergies, family history, immunizations andmedications were updated in the EMR today and reviewed under the history and medication portions of their EMR.     No results found for this or any previous visit (from the past 2160 hour(s)).  US BREAST LTD UNI LEFT INC AXILLA Result Date: 08/06/2021 reminder letter will be sent to the patient regarding the next appointment. BI-RADS CATEGORY  3: Probably benign. Electronically Signed   By:  Lajean Manes M.D.   On: 08/06/2021 09:11    ROS: 14 pt review of systems performed and negative (unless mentioned in an HPI)  Objective: BP 118/79   Pulse 85   Temp 98 F (36.7 C) (Oral)   Ht 5\' 1"  (1.549 m)   Wt 174 lb (78.9 kg)   LMP 10/11/2021   SpO2 96%   BMI 32.88 kg/m  Gen: Afebrile. No acute distress. Nontoxic in appearance, well-developed, well-nourished, very pleasant, obese female HENT: AT. [place]. Bilateral TM visualized and normal in appearance, normal external auditory canal. MMM, no oral lesions, adequate dentition. Bilateral nares within normal limits. Throat without erythema, ulcerations or exudates.  No cough on exam, no hoarseness on exam. Eyes:Pupils Equal Round Reactive to light, Extraocular movements intact,  Conjunctiva without redness, discharge or icterus. Neck/lymp/endocrine: Supple, no lymphadenopathy, no thyromegaly CV: RRR no murmur, no  edema, +2/4 P posterior tibialis pulses.  Chest: CTAB, no wheeze, rhonchi or crackles.  Normal respiratory effort.  Good air movement. Abd: Soft.  Flat. NTND. BS present.  No masses palpated. No hepatosplenomegaly. No rebound tenderness or guarding. Skin: No rashes, purpura or petechiae. Warm and well-perfused. Skin intact. Neuro/Msk:  Normal gait. PERLA. EOMi. Alert. Oriented x3.  Cranial nerves II through XII intact. Muscle strength 5/5 upper/lower extremity. DTRs equal bilaterally. Psych: Normal affect, dress and demeanor. Normal speech. Normal thought content and judgment.   No results found.  Assessment/plan: Maria Goodwin is a 40 y.o. female present for CPE/CMC Primary hypertension/Obesity (BMI 30-39.9)/Palpitations Stable. TSH collected today Low sodium  Continue amlodipine 10 mg QD  Next appt 6 mos    Iron deficiency Improved last visit- however ferritin still a little low.  Repeat levels today. She is currently taking M, W, F- hopefully will be able to DC after today. If not- refills prescribed and encourage  to continue if abnormal or 1-3 months  if donates blood.  - Iron, TIBC and Ferritin Panel collected today   Menorrhagia with regular cycle Much better controlled on naproxen. Continue naproxen a few days prior to menstrual cycle and through bleeding pattern.  Preventative healthcare exam: Colonoscopy: routine screen at 67 Mammogram: completed:2022 > has scheduled 02/04/2022 Cervical cancer screening: last pap: 2021> pcp wnl-hpv- 5 yr Immunizations: tdap UTD 2017, Influenza UTD 2022(encouraged yearly), covid pfizer series completed.  Infectious disease screening: HIV completed, Hep C completed DEXA: routine screen Patient was encouraged to exercise greater than 150 minutes a week. Patient was encouraged to choose a diet filled with fresh fruits and vegetables, and lean meats. AVS provided to patient today for education/recommendation on gender specific health and safety maintenance.  Return in about 24 weeks (around 04/01/2022) for CMC (30 min).   Orders Placed This Encounter  Procedures   CBC   Comprehensive metabolic panel   Hemoglobin A1c   Iron, TIBC and Ferritin Panel   Lipid panel   TSH   VITAMIN D 25 Hydroxy (Vit-D Deficiency, Fractures)   Meds ordered this encounter  Medications   amLODipine (NORVASC) 10 MG tablet    Sig: Take 1 tablet (10 mg total) by mouth daily.    Dispense:  90 tablet    Refill:  1   naproxen (NAPROSYN) 500 MG tablet    Sig: Take 1 tablet (500 mg total) by mouth 2 (two) times daily as needed for mild pain.    Dispense:  60 tablet    Refill:  5   Referral Orders  No referral(s) requested today     Electronically signed by: Howard Pouch, [place]

## 2021-10-16 LAB — CBC
HCT: 36.2 % (ref 35.0–45.0)
Hemoglobin: 12.5 g/dL (ref 11.7–15.5)
MCH: 30.6 pg (ref 27.0–33.0)
MCHC: 34.5 g/dL (ref 32.0–36.0)
MCV: 88.5 fL (ref 80.0–100.0)
MPV: 9.4 fL (ref 7.5–12.5)
Platelets: 488 10*3/uL — ABNORMAL HIGH (ref 140–400)
RBC: 4.09 10*6/uL (ref 3.80–5.10)
RDW: 12.4 % (ref 11.0–15.0)
WBC: 12.4 10*3/uL — ABNORMAL HIGH (ref 3.8–10.8)

## 2021-10-16 LAB — COMPREHENSIVE METABOLIC PANEL
AG Ratio: 1.5 (calc) (ref 1.0–2.5)
ALT: 28 U/L (ref 6–29)
AST: 23 U/L (ref 10–30)
Albumin: 4.4 g/dL (ref 3.6–5.1)
Alkaline phosphatase (APISO): 76 U/L (ref 31–125)
BUN: 11 mg/dL (ref 7–25)
CO2: 26 mmol/L (ref 20–32)
Calcium: 9.5 mg/dL (ref 8.6–10.2)
Chloride: 102 mmol/L (ref 98–110)
Creat: 0.7 mg/dL (ref 0.50–0.99)
Globulin: 2.9 g/dL (calc) (ref 1.9–3.7)
Glucose, Bld: 80 mg/dL (ref 65–99)
Potassium: 4 mmol/L (ref 3.5–5.3)
Sodium: 139 mmol/L (ref 135–146)
Total Bilirubin: 0.4 mg/dL (ref 0.2–1.2)
Total Protein: 7.3 g/dL (ref 6.1–8.1)

## 2021-10-16 LAB — LIPID PANEL
Cholesterol: 201 mg/dL — ABNORMAL HIGH (ref <200)
HDL: 60 mg/dL (ref >=50)
LDL Cholesterol (Calc): 125 mg/dL (calc) — ABNORMAL HIGH
Non-HDL Cholesterol (Calc): 141 mg/dL (calc) — ABNORMAL HIGH (ref <130)
Total CHOL/HDL Ratio: 3.4 (calc) (ref <5.0)
Triglycerides: 66 mg/dL (ref <150)

## 2021-10-16 LAB — HEMOGLOBIN A1C
Hgb A1c MFr Bld: 5.3 % of total Hgb (ref <5.7)
Mean Plasma Glucose: 105 mg/dL
eAG (mmol/L): 5.8 mmol/L

## 2021-10-16 LAB — VITAMIN D 25 HYDROXY (VIT D DEFICIENCY, FRACTURES): Vit D, 25-Hydroxy: 44 ng/mL (ref 30–100)

## 2021-10-16 LAB — IRON,TIBC AND FERRITIN PANEL
%SAT: 25 % (calc) (ref 16–45)
Ferritin: 17 ng/mL (ref 16–232)
Iron: 76 ug/dL (ref 40–190)
TIBC: 308 mcg/dL (calc) (ref 250–450)

## 2021-10-16 LAB — TSH: TSH: 0.98 mIU/L

## 2021-11-18 HISTORY — PX: BREAST BIOPSY: SHX20

## 2022-01-24 IMAGING — US US BREAST BX W LOC DEV 1ST LESION IMG BX SPEC US GUIDE*L*
1 series · 10 of 10 positions shown · non-contrast
Comparison: Previous exam(s).
COMPARISON: Previous exam(s).

Addendum:
CLINICAL DATA: 39-year-old female with an indeterminate mass in the
left breast at the [DATE] position.

EXAM:
ULTRASOUND GUIDED LEFT BREAST CORE NEEDLE BIOPSY

[Series 1: us breast bx w loc dev 1st lesion img bx spec us g · 0.07mm/px · 10 of 10 slices shown]
[im 1/10]
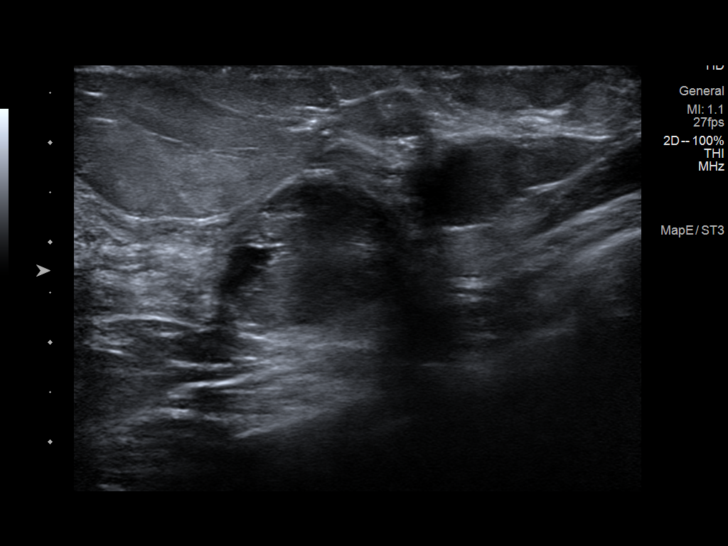
[im 2/10]
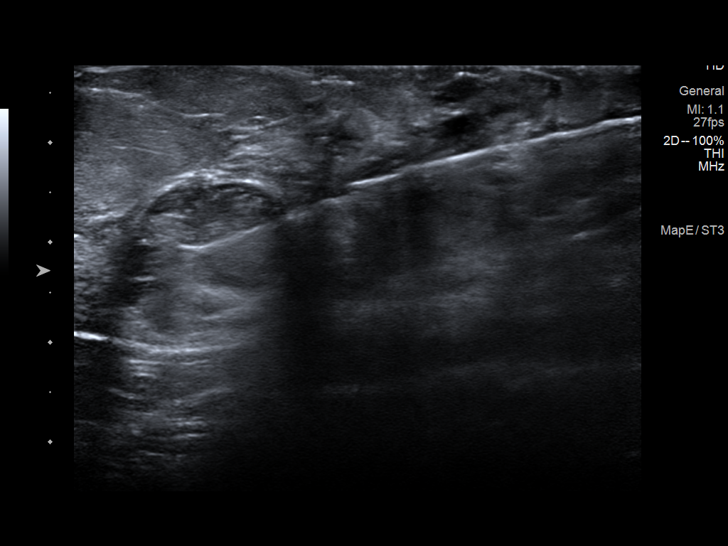
[im 3/10]
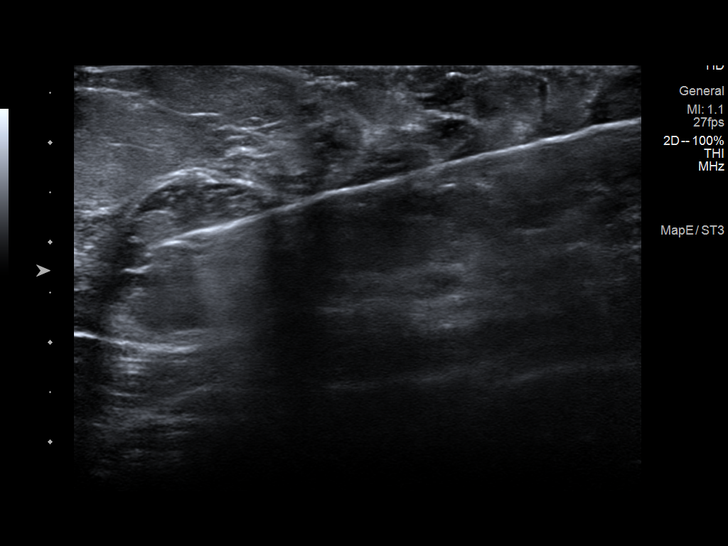
[im 4/10]
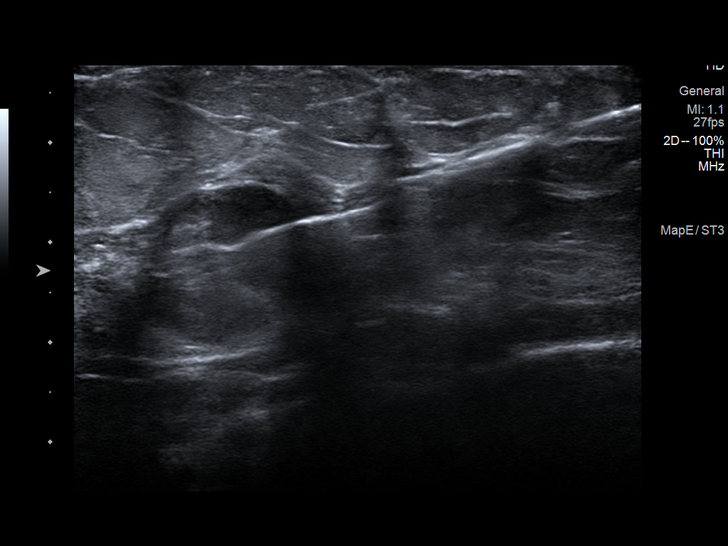
[im 5/10]
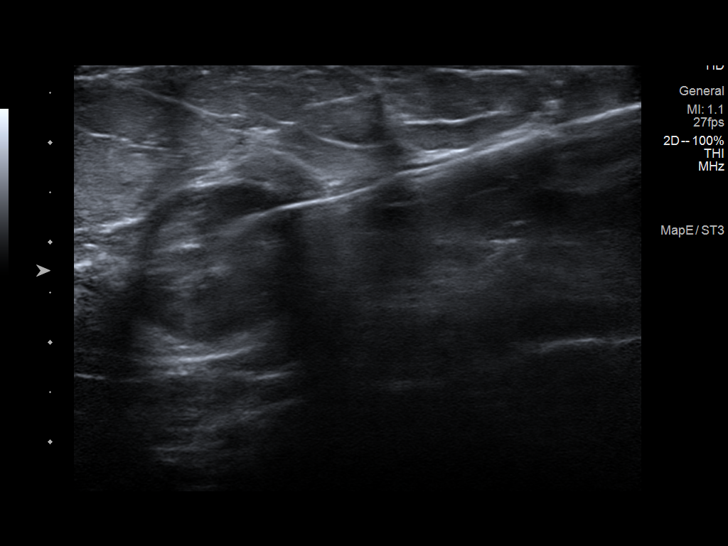
[im 6/10]
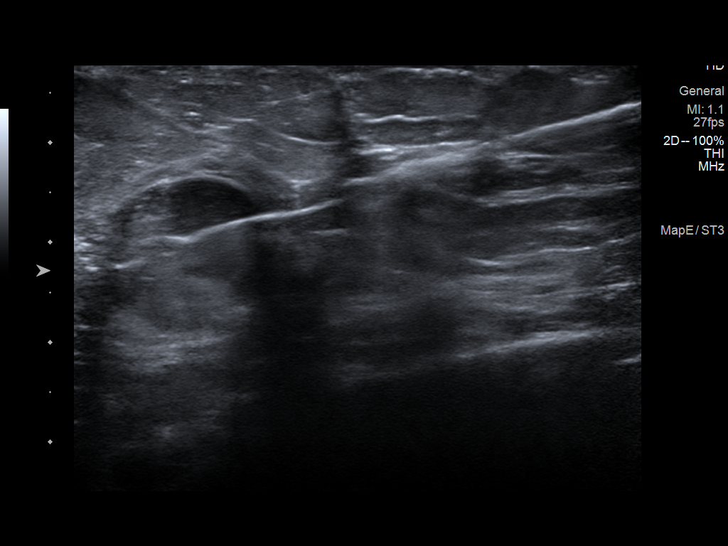
[im 7/10]
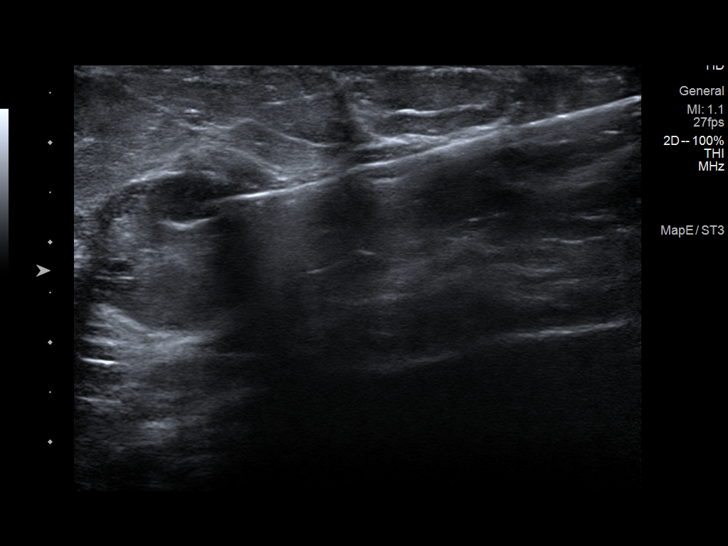
[im 8/10]
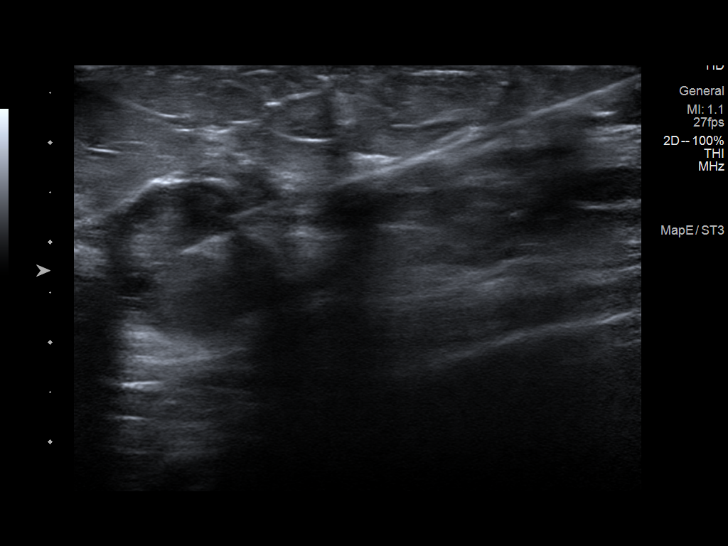
[im 9/10]
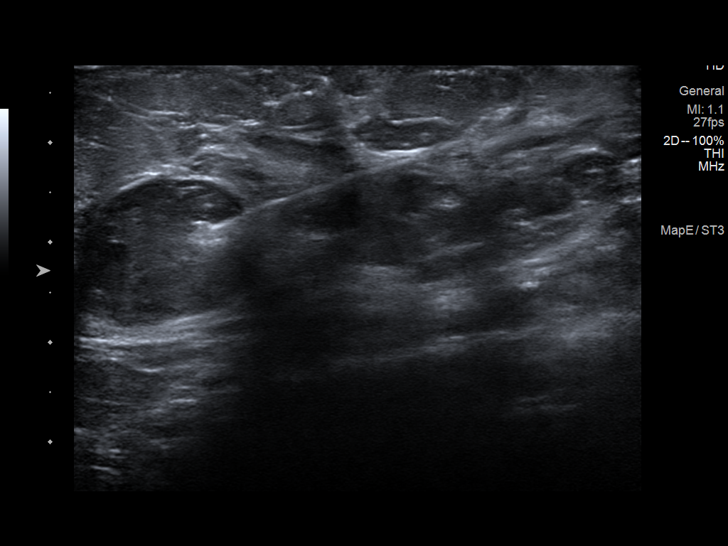
[im 10/10]
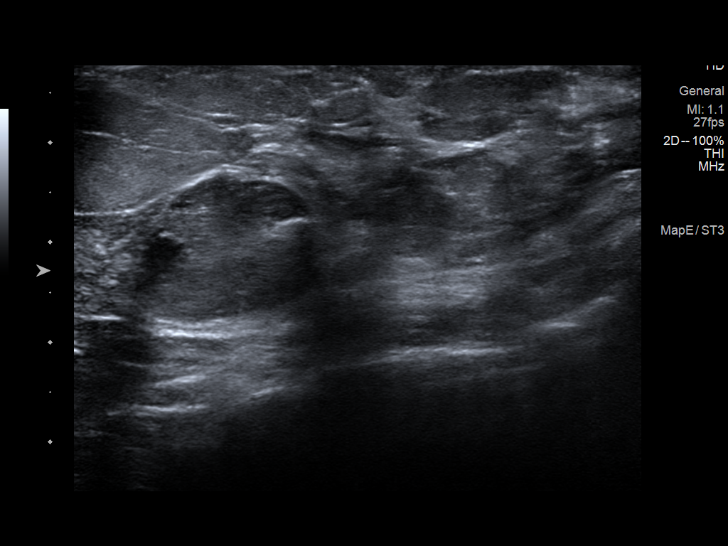

[10 of 10 positions shown; findings below may reference images not displayed]



Lesion quadrant: Upper-outer

Using sterile technique and 1% Lidocaine as local anesthetic, under
direct ultrasound visualization, a 14 gauge Denisse device was
used to perform biopsy of the mass in the left breast at the [DATE]
position using a lateral to medial approach. At the conclusion of
the procedure a coil shaped tissue marker clip was deployed into the
biopsy cavity. Follow up 2 view mammogram was performed and dictated
separately.
IMPRESSION: Ultrasound guided biopsy of the mass in the left breast at the [DATE]
position. No apparent complications.

ADDENDUM:
Pathology revealed FIBROADENOMA, FIBROCYSTIC CHANGE, NO MALIGNANCY
IDENTIFIED of the LEFT breast, 2:30 o'clock. This was found to be
concordant by Dr. Daisy Tiger.

Pathology results were discussed with the patient by telephone. The
patient reported doing well after the biopsy with tenderness at the
site. Post biopsy instructions and care were reviewed and questions
were answered. The patient was encouraged to call The [REDACTED]

The patient was instructed to return for left diagnostic mammography
and possible ultrasound of additional left breast mass in 6 months
and informed a reminder notice would be sent regarding this
appointment.

Pathology results reported by Kyng Fromm RN on 01/16/2021.



Lesion quadrant: Upper-outer

Using sterile technique and 1% Lidocaine as local anesthetic, under
direct ultrasound visualization, a 14 gauge Denisse device was
used to perform biopsy of the mass in the left breast at the [DATE]
position using a lateral to medial approach. At the conclusion of
the procedure a coil shaped tissue marker clip was deployed into the
biopsy cavity. Follow up 2 view mammogram was performed and dictated
separately.
IMPRESSION: Ultrasound guided biopsy of the mass in the left breast at the [DATE]
position. No apparent complications.

## 2022-02-04 ENCOUNTER — Other Ambulatory Visit: Payer: Self-pay | Admitting: Family Medicine

## 2022-02-04 ENCOUNTER — Ambulatory Visit
Admission: RE | Admit: 2022-02-04 | Discharge: 2022-02-04 | Disposition: A | Discharge status: Home / Self Care | Payer: Federal, State, Local not specified - PPO | Source: Ambulatory Visit | Attending: Family Medicine | Admitting: Family Medicine

## 2022-02-04 DIAGNOSIS — N6320 Unspecified lump in the left breast, unspecified quadrant: Secondary | ICD-10-CM

## 2022-02-04 DIAGNOSIS — R922 Inconclusive mammogram: Secondary | ICD-10-CM | POA: Diagnosis not present

## 2022-02-15 ENCOUNTER — Ambulatory Visit
Admission: RE | Admit: 2022-02-15 | Discharge: 2022-02-15 | Disposition: A | Discharge status: Home / Self Care | Payer: Federal, State, Local not specified - PPO | Source: Ambulatory Visit | Attending: Family Medicine | Admitting: Family Medicine

## 2022-02-15 DIAGNOSIS — N6325 Unspecified lump in the left breast, overlapping quadrants: Secondary | ICD-10-CM | POA: Diagnosis not present

## 2022-02-15 DIAGNOSIS — D242 Benign neoplasm of left breast: Secondary | ICD-10-CM | POA: Diagnosis not present

## 2022-02-15 DIAGNOSIS — N6320 Unspecified lump in the left breast, unspecified quadrant: Secondary | ICD-10-CM

## 2022-05-15 DIAGNOSIS — D225 Melanocytic nevi of trunk: Secondary | ICD-10-CM | POA: Diagnosis not present

## 2022-05-15 DIAGNOSIS — L821 Other seborrheic keratosis: Secondary | ICD-10-CM | POA: Diagnosis not present

## 2022-05-15 DIAGNOSIS — L814 Other melanin hyperpigmentation: Secondary | ICD-10-CM | POA: Diagnosis not present

## 2022-05-15 DIAGNOSIS — L910 Hypertrophic scar: Secondary | ICD-10-CM | POA: Diagnosis not present

## 2022-05-17 ENCOUNTER — Encounter: Payer: Self-pay | Admitting: Family Medicine

## 2022-05-17 NOTE — Telephone Encounter (Signed)
Please advise 

## 2022-05-22 NOTE — Telephone Encounter (Signed)
When annual mammogram is due, she would make an appt with this provider and we would order mammogram and MRI. Dr. Raliegh Ip

## 2022-05-23 NOTE — Telephone Encounter (Signed)
Please advise 

## 2022-05-27 NOTE — Telephone Encounter (Signed)
The read on her mammogram report did not suggest they felt a MRI of the breast was needed immediately.   This is a gray area recommendation by radiology and is up to the patient and her insurance.  Many insurances do not necessarily cover an MRI of the breast for screening purposes.   If she wants to consider an MRI of the breast or discuss further, make an appointment to discuss with this provider. Otherwise, we will order it next year.  Regardless, I would encourage her to contact her insurance and see if they would cover an MRI of the breast for screening purposes.  Just so we know moving forward if this is an option she wants to pursue.

## 2022-06-06 ENCOUNTER — Other Ambulatory Visit: Payer: Self-pay

## 2022-06-06 MED ORDER — AMLODIPINE BESYLATE 10 MG PO TABS: 10.0000 mg | ORAL_TABLET | Freq: Every day | ORAL | 0 refills | Status: DC

## 2022-06-29 ENCOUNTER — Other Ambulatory Visit: Payer: Self-pay | Admitting: Family Medicine

## 2022-07-05 ENCOUNTER — Encounter: Payer: Self-pay | Admitting: Family Medicine

## 2022-07-05 ENCOUNTER — Ambulatory Visit: Payer: Federal, State, Local not specified - PPO | Admitting: Family Medicine

## 2022-07-05 VITALS — BP 118/75 | HR 90 | Temp 98.6°F | Ht 61.0 in | Wt 173.0 lb

## 2022-07-05 DIAGNOSIS — Z23 Encounter for immunization: Secondary | ICD-10-CM

## 2022-07-05 DIAGNOSIS — N946 Dysmenorrhea, unspecified: Secondary | ICD-10-CM

## 2022-07-05 DIAGNOSIS — E669 Obesity, unspecified: Secondary | ICD-10-CM | POA: Diagnosis not present

## 2022-07-05 DIAGNOSIS — I10 Essential (primary) hypertension: Secondary | ICD-10-CM

## 2022-07-05 DIAGNOSIS — R002 Palpitations: Secondary | ICD-10-CM

## 2022-07-05 DIAGNOSIS — N920 Excessive and frequent menstruation with regular cycle: Secondary | ICD-10-CM

## 2022-07-05 MED ORDER — NAPROXEN 500 MG PO TABS: 500.0000 mg | ORAL_TABLET | Freq: Two times a day (BID) | ORAL | 5 refills | Status: DC | PRN

## 2022-07-05 MED ORDER — AMLODIPINE BESYLATE 10 MG PO TABS: 10.0000 mg | ORAL_TABLET | Freq: Every day | ORAL | 1 refills | Status: DC

## 2022-07-05 NOTE — Progress Notes (Signed)
Patient ID: Maria Goodwin, female  DOB: [birth year], 41 y.o.   MRN: [id number] Patient Care Team    Relationship Specialty Notifications Start End  [name], DO PCP - General Family Medicine  02/22/20     Chief Complaint  Patient presents with   Hypertension    Cmc; pt is not fasting    Subjective: Maria Goodwin is a 41 y.o.  Female  present for cmc All past medical history, surgical history, allergies, family history, immunizations, medications and social history were updated in the electronic medical record today. All recent labs, ED visits and hospitalizations within the last year were reviewed.  Menstrual cramps/menorrhagia/IDA: cramps ad bleeding pattern improved on naproxen. She takes naproxen a few days prior to menses and through menses. Currently taking iron M,W,F and tolerating.    Hypertension/obesity/palpitations:  Pt reports compliance with amlodipine 10 mg daily. Blood pressures ranges at home routinely checked. Patient denies chest pain, shortness of breath, dizziness or lower extremity edema.  She reports the palpitations have greatly improved. Pt is not taking a daily baby ASA. Pt is not prescribed a prescribed statin. RF: Hypertension, obesity, family history of hypertension, hyperlipidemia and stroke     10/15/2021    2:37 PM 09/14/2020    8:01 AM 02/22/2020    1:25 PM  Depression screen PHQ 2/9  Decreased Interest 0 0 0  Down, Depressed, Hopeless 0 0 0  PHQ - 2 Score 0 0 0       No data to display           Immunization History  Administered Date(s) Administered   Hepatitis A 08/15/2016, 02/11/2017   Hepatitis B 01/31/1987   Influenza,inj,Quad PF,6+ Mos 07/21/2021   Influenza-Unspecified 09/07/2019, 08/02/2020   PFIZER(Purple Top)SARS-COV-2 Vaccination 02/08/2020, 02/29/2020, 09/15/2020   Pfizer Covid-19 Vaccine Bivalent Booster 82yr & up 08/09/2021   Tdap 08/15/2016     Past Medical History:  Diagnosis Date   Allergic reaction  02/23/2020   Itchy mouth with watermelon and cucumber.  Refer to allergist.   Allergic rhinitis    Allergy    Anemia 2021   Anxiety Since childhood   Blood in stool 2010   Patient reports hospitalization for blood in stool, diarrhea and extreme dehydration.  She was hospitalized at this time and a colonoscopy was completed.   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   Frequent headaches    GERD (gastroesophageal reflux disease)    Heart murmur    Hypertension    Iron deficiency anemia    Irregular menstrual bleeding 2015   Osteoarthritis of knee 2016   Patellofemoral pain syndrome 04/2015   Tinea corporis    Tonsil stone 2016   Vitamin D deficiency    No Known Allergies Past Surgical History:  Procedure Laterality Date   BREAST BIOPSY Left 08/2016   fibroadenoma   BREAST BIOPSY Left 01/15/2021   CESAREAN SECTION  2012   WISDOM TOOTH EXTRACTION     Family History  Problem Relation Age of Onset   Arthritis Mother    Hyperlipidemia Mother    Hypertension Mother    Learning disabilities Mother    Allergic rhinitis Mother    Alcohol abuse Father    Depression Father    Vision loss Father    Asthma Brother    Hyperlipidemia Brother    Hypertension Brother    Allergic rhinitis Brother    Allergic rhinitis Son    Eczema Son  Urticaria Son    Bladder Cancer Maternal Grandmother    Early death Maternal Grandmother    Skin cancer Maternal Grandfather    Early death Maternal Grandfather    Stroke Maternal Grandfather    Hypertension Maternal Grandfather    Skin cancer Paternal Grandmother    Skin cancer Paternal Grandfather    Asthma Brother    Hyperlipidemia Brother    Hypertension Brother    Allergic rhinitis Brother    Breast cancer Neg Hx    Social History   Social History Narrative   Marital status/children/pets: Married, 1 child.   Education/employment: High school graduate, stay-at-home mom   Safety:      -Wears a bicycle helmet riding a bike:  Yes     -smoke alarm in the home:Yes     - wears seatbelt: Yes     - Feels safe in their relationships: Yes    Allergies as of 07/05/2022   No Known Allergies      Medication List        Accurate as of July 05, 2022  5:39 PM. If you have any questions, ask your nurse or doctor.          STOP taking these medications    metroNIDAZOLE 0.75 % cream Commonly known as: METROCREAM Stopped by: Howard Pouch, DO       TAKE these medications    ALLEGRA-D 24 HOUR PO Take by mouth.   amLODipine 10 MG tablet Commonly known as: NORVASC Take 1 tablet (10 mg total) by mouth daily.   MULTI COMPLETE PO Take by mouth.   naproxen 500 MG tablet Commonly known as: Naprosyn Take 1 tablet (500 mg total) by mouth 2 (two) times daily as needed for mild pain.        All past medical history, surgical history, allergies, family history, immunizations andmedications were updated in the EMR today and reviewed under the history and medication portions of their EMR.     No results found for this or any previous visit (from the past 2160 hour(s)).  US BREAST LTD UNI LEFT INC AXILLA Result Date: 08/06/2021 reminder letter will be sent to the patient regarding the next appointment. BI-RADS CATEGORY  3: Probably benign. Electronically Signed   By: Lajean Manes M.D.   On: 08/06/2021 09:11    ROS: 14 pt review of systems performed and negative (unless mentioned in an HPI)  Objective: BP 118/75   Pulse 90   Temp 98.6 F (37 C) (Oral)   Ht '5\' 1"'$  (1.549 m)   Wt 173 lb (78.5 kg)   LMP 06/20/2022   SpO2 98%   BMI 32.69 kg/m  Physical Exam Vitals and nursing note reviewed.  Constitutional:      General: She is not in acute distress.    Appearance: Normal appearance. She is not ill-appearing, toxic-appearing or diaphoretic.  HENT:     Head: Normocephalic and atraumatic.  Eyes:     General: No scleral icterus.       Right eye: No discharge.        Left eye: No discharge.      Extraocular Movements: Extraocular movements intact.     Conjunctiva/sclera: Conjunctivae normal.     Pupils: Pupils are equal, round, and reactive to light.  Cardiovascular:     Rate and Rhythm: Normal rate and regular rhythm.  Pulmonary:     Effort: Pulmonary effort is normal. No respiratory distress.     Breath sounds: Normal breath sounds.  No wheezing, rhonchi or rales.  Musculoskeletal:     Right lower leg: No edema.     Left lower leg: No edema.  Skin:    General: Skin is warm and dry.     Coloration: Skin is not jaundiced or pale.     Findings: No erythema or rash.  Neurological:     Mental Status: She is alert and oriented to person, place, and time. Mental status is at baseline.     Motor: No weakness.     Gait: Gait normal.  Psychiatric:        Mood and Affect: Mood normal.        Behavior: Behavior normal.        Thought Content: Thought content normal.        Judgment: Judgment normal.     No results found.  Assessment/plan: Maria Goodwin is a 41 y.o. female present for [facility] Primary hypertension/Obesity (BMI 30-39.9)/Palpitations Stable Labs up-to-date Low sodium  Continue amlodipine 10 mg QD    Iron deficiency Improved last visit-    Menorrhagia with regular cycle Stable Continue  naproxen a few days prior to menstrual cycle and through bleeding pattern.  Return in about 24 weeks (around 12/20/2022) for cpe (20 min), Routine chronic condition follow-up.   Orders Placed This Encounter  Procedures   Flu Vaccine QUAD 6+ mos PF IM (Fluarix Quad PF)   Meds ordered this encounter  Medications   amLODipine (NORVASC) 10 MG tablet    Sig: Take 1 tablet (10 mg total) by mouth daily.    Dispense:  90 tablet    Refill:  1    Needs OV for refills   naproxen (NAPROSYN) 500 MG tablet    Sig: Take 1 tablet (500 mg total) by mouth 2 (two) times daily as needed for mild pain.    Dispense:  60 tablet    Refill:  5   Referral Orders  No referral(s) requested  today     Electronically signed by: Howard Pouch, [place]

## 2022-07-05 NOTE — Patient Instructions (Addendum)
Return in about 24 weeks (around 12/20/2022) for cpe (20 min), Routine chronic condition follow-up.        Great to see you today.  I have refilled the medication(s) we provide.   If labs were collected, we will inform you of lab results once received either by echart message or telephone call.   - echart message- for normal results that have been seen by the patient already.   - telephone call: abnormal results or if patient has not viewed results in their echart.

## 2022-07-08 NOTE — Addendum Note (Signed)
Addended by: Kavin Leech on: 07/08/2022 12:00 PM   Modules accepted: Orders

## 2022-07-23 ENCOUNTER — Encounter: Payer: Self-pay | Admitting: Family Medicine

## 2022-07-23 DIAGNOSIS — R922 Inconclusive mammogram: Secondary | ICD-10-CM

## 2022-07-23 DIAGNOSIS — Z1239 Encounter for other screening for malignant neoplasm of breast: Secondary | ICD-10-CM

## 2022-07-25 NOTE — Telephone Encounter (Signed)
Order placed for [employer] on [employer] for breast MRI

## 2022-08-20 ENCOUNTER — Ambulatory Visit
Admission: RE | Admit: 2022-08-20 | Discharge: 2022-08-20 | Disposition: A | Discharge status: Home / Self Care | Payer: Federal, State, Local not specified - PPO | Source: Ambulatory Visit | Attending: Family Medicine | Admitting: Family Medicine

## 2022-08-20 DIAGNOSIS — N6489 Other specified disorders of breast: Secondary | ICD-10-CM | POA: Diagnosis not present

## 2022-08-20 DIAGNOSIS — Z1239 Encounter for other screening for malignant neoplasm of breast: Secondary | ICD-10-CM

## 2022-08-20 DIAGNOSIS — R9230 Dense breasts, unspecified: Secondary | ICD-10-CM

## 2022-08-20 MED ORDER — GADOPICLENOL 0.5 MMOL/ML IV SOLN
10.0000 mL | Freq: Once | INTRAVENOUS | Status: AC | PRN
  Administered 2022-08-20: 8 mL via INTRAVENOUS

## 2022-09-25 DIAGNOSIS — J019 Acute sinusitis, unspecified: Secondary | ICD-10-CM | POA: Diagnosis not present

## 2022-11-25 DIAGNOSIS — T781XXA Other adverse food reactions, not elsewhere classified, initial encounter: Secondary | ICD-10-CM | POA: Diagnosis not present

## 2022-11-25 DIAGNOSIS — J301 Allergic rhinitis due to pollen: Secondary | ICD-10-CM | POA: Diagnosis not present

## 2022-11-25 DIAGNOSIS — J3089 Other allergic rhinitis: Secondary | ICD-10-CM | POA: Diagnosis not present

## 2022-11-25 DIAGNOSIS — R052 Subacute cough: Secondary | ICD-10-CM | POA: Diagnosis not present

## 2022-12-12 DIAGNOSIS — J301 Allergic rhinitis due to pollen: Secondary | ICD-10-CM | POA: Diagnosis not present

## 2022-12-13 DIAGNOSIS — J3089 Other allergic rhinitis: Secondary | ICD-10-CM | POA: Diagnosis not present

## 2022-12-19 DIAGNOSIS — J301 Allergic rhinitis due to pollen: Secondary | ICD-10-CM | POA: Diagnosis not present

## 2022-12-19 DIAGNOSIS — J3089 Other allergic rhinitis: Secondary | ICD-10-CM | POA: Diagnosis not present

## 2022-12-19 DIAGNOSIS — J3081 Allergic rhinitis due to animal (cat) (dog) hair and dander: Secondary | ICD-10-CM | POA: Diagnosis not present

## 2022-12-20 ENCOUNTER — Encounter: Payer: Self-pay | Admitting: Family Medicine

## 2022-12-20 ENCOUNTER — Ambulatory Visit (INDEPENDENT_AMBULATORY_CARE_PROVIDER_SITE_OTHER): Payer: Federal, State, Local not specified - PPO | Admitting: Family Medicine

## 2022-12-20 VITALS — BP 124/85 | HR 80 | Temp 98.5°F | Ht 61.0 in | Wt 172.6 lb

## 2022-12-20 DIAGNOSIS — N920 Excessive and frequent menstruation with regular cycle: Secondary | ICD-10-CM | POA: Diagnosis not present

## 2022-12-20 DIAGNOSIS — D649 Anemia, unspecified: Secondary | ICD-10-CM

## 2022-12-20 DIAGNOSIS — Z1231 Encounter for screening mammogram for malignant neoplasm of breast: Secondary | ICD-10-CM | POA: Diagnosis not present

## 2022-12-20 DIAGNOSIS — I10 Essential (primary) hypertension: Secondary | ICD-10-CM | POA: Diagnosis not present

## 2022-12-20 DIAGNOSIS — E669 Obesity, unspecified: Secondary | ICD-10-CM

## 2022-12-20 DIAGNOSIS — N946 Dysmenorrhea, unspecified: Secondary | ICD-10-CM | POA: Diagnosis not present

## 2022-12-20 DIAGNOSIS — Z0000 Encounter for general adult medical examination without abnormal findings: Secondary | ICD-10-CM | POA: Diagnosis not present

## 2022-12-20 DIAGNOSIS — E559 Vitamin D deficiency, unspecified: Secondary | ICD-10-CM | POA: Diagnosis not present

## 2022-12-20 LAB — LIPID PANEL
Cholesterol: 200 mg/dL (ref 0–200)
HDL: 57.6 mg/dL (ref >39.00)
LDL Cholesterol: 118 mg/dL — ABNORMAL HIGH (ref 0–99)
NonHDL: 142.74
Total CHOL/HDL Ratio: 3
Triglycerides: 126 mg/dL (ref 0.0–149.0)
VLDL: 25.2 mg/dL (ref 0.0–40.0)

## 2022-12-20 LAB — CBC WITH DIFFERENTIAL/PLATELET
Basophils Absolute: 0.1 10*3/uL (ref 0.0–0.1)
Basophils Relative: 1 % (ref 0.0–3.0)
Eosinophils Absolute: 0.2 10*3/uL (ref 0.0–0.7)
Eosinophils Relative: 1.7 % (ref 0.0–5.0)
HCT: 38 % (ref 36.0–46.0)
Hemoglobin: 12.7 g/dL (ref 12.0–15.0)
Lymphocytes Relative: 25.4 % (ref 12.0–46.0)
Lymphs Abs: 2.2 10*3/uL (ref 0.7–4.0)
MCHC: 33.5 g/dL (ref 30.0–36.0)
MCV: 87.5 fl (ref 78.0–100.0)
Monocytes Absolute: 0.5 10*3/uL (ref 0.1–1.0)
Monocytes Relative: 5.7 % (ref 3.0–12.0)
Neutro Abs: 5.8 10*3/uL (ref 1.4–7.7)
Neutrophils Relative %: 66.2 % (ref 43.0–77.0)
Platelets: 464 10*3/uL — ABNORMAL HIGH (ref 150.0–400.0)
RBC: 4.34 Mil/uL (ref 3.87–5.11)
RDW: 13.5 % (ref 11.5–15.5)
WBC: 8.7 10*3/uL (ref 4.0–10.5)

## 2022-12-20 LAB — IBC + FERRITIN
Ferritin: 18.4 ng/mL (ref 10.0–291.0)
Iron: 153 ug/dL — ABNORMAL HIGH (ref 42–145)
Saturation Ratios: 50.6 % — ABNORMAL HIGH (ref 20.0–50.0)
TIBC: 302.4 ug/dL (ref 250.0–450.0)
Transferrin: 216 mg/dL (ref 212.0–360.0)

## 2022-12-20 LAB — COMPREHENSIVE METABOLIC PANEL
ALT: 26 U/L (ref 0–35)
AST: 25 U/L (ref 0–37)
Albumin: 4.3 g/dL (ref 3.5–5.2)
Alkaline Phosphatase: 76 U/L (ref 39–117)
BUN: 9 mg/dL (ref 6–23)
CO2: 28 mEq/L (ref 19–32)
Calcium: 9.1 mg/dL (ref 8.4–10.5)
Chloride: 100 mEq/L (ref 96–112)
Creatinine, Ser: 0.78 mg/dL (ref 0.40–1.20)
GFR: 92.85 mL/min (ref >60.00)
Glucose, Bld: 88 mg/dL (ref 70–99)
Potassium: 4 mEq/L (ref 3.5–5.1)
Sodium: 136 mEq/L (ref 135–145)
Total Bilirubin: 0.5 mg/dL (ref 0.2–1.2)
Total Protein: 7 g/dL (ref 6.0–8.3)

## 2022-12-20 LAB — VITAMIN D 25 HYDROXY (VIT D DEFICIENCY, FRACTURES): VITD: 22.96 ng/mL — ABNORMAL LOW (ref 30.00–100.00)

## 2022-12-20 LAB — TSH: TSH: 0.96 u[IU]/mL (ref 0.35–5.50)

## 2022-12-20 LAB — HEMOGLOBIN A1C: Hgb A1c MFr Bld: 5.4 % (ref 4.6–6.5)

## 2022-12-20 MED ORDER — AMLODIPINE BESYLATE 10 MG PO TABS: 10.0000 mg | ORAL_TABLET | Freq: Every day | ORAL | 1 refills | Status: DC

## 2022-12-20 MED ORDER — NAPROXEN 500 MG PO TABS: 500.0000 mg | ORAL_TABLET | Freq: Two times a day (BID) | ORAL | 5 refills | Status: DC | PRN

## 2022-12-20 NOTE — Patient Instructions (Addendum)
Return in about 24 weeks (around 06/06/2023) for Routine chronic condition follow-up.        Great to see you today.  I have refilled the medication(s) we provide.   If labs were collected, we will inform you of lab results once received either by echart message or telephone call.   - echart message- for normal results that have been seen by the patient already.   - telephone call: abnormal results or if patient has not viewed results in their echart.

## 2022-12-20 NOTE — Progress Notes (Signed)
Patient ID: Maria Goodwin, female  DOB: September 25, 1981, 42 y.o.   MRN: [id number] Patient Care Team    Relationship Specialty Notifications Start End  [name], DO PCP - General Family Medicine  02/22/20     Chief Complaint  Patient presents with   Annual Exam    Pt is fasting    Subjective: Maria Goodwin is a 42 y.o.  Female  present for cpe and Chronic Conditions/illness Management All past medical history, surgical history, allergies, family history, immunizations, medications and social history were updated in the electronic medical record today. All recent labs, ED visits and hospitalizations within the last year were reviewed.  Health maintenance:  Colonoscopy: routine screen at 48 Mammogram: completed:01/2022 > placed order for 01/2023 [facility] Cervical cancer screening: last pap: 2021> pcp wnl-hpv- 5 yr Immunizations: tdap UTD 2017, Influenza UTD 2023(encouraged yearly) Infectious disease screening: HIV completed, Hep C completed DEXA: routine screen Menstrual cramps/menorrhagia/IDA: cramps ad bleeding pattern  improved on naproxen. She takes naproxen a few days prior to menses and through menses. Currently taking iron M,W,F and tolerating.    Hypertension/obesity/palpitations:  Pt reports compliance with amlodipine 10 mg daily. Blood pressures ranges at home routinely checked. Patient denies chest pain, shortness of breath, dizziness or lower extremity edema.   Pt is not taking a daily baby ASA. Pt is not prescribed a prescribed statin. RF: Hypertension, obesity, family history of hypertension, hyperlipidemia and stroke     10/15/2021    2:37 PM 09/14/2020    8:01 AM 02/22/2020    1:25 PM  Depression screen PHQ 2/9  Decreased Interest 0 0 0  Down, Depressed, Hopeless 0 0 0  PHQ - 2 Score 0 0 0       No data to display           Immunization History  Administered Date(s) Administered   Hepatitis A 08/15/2016, 02/11/2017   Hepatitis B 01/31/1987    Influenza,inj,Quad PF,6+ Mos 07/21/2021, 07/05/2022   Influenza-Unspecified 09/07/2019, 08/02/2020   PFIZER Comirnaty(Gray Top)Covid-19 Tri-Sucrose Vaccine 08/13/2022   PFIZER(Purple Top)SARS-COV-2 Vaccination 02/08/2020, 02/29/2020, 09/15/2020   Pfizer Covid-19 Vaccine Bivalent Booster 10yr & up 08/09/2021   Tdap 08/15/2016     Past Medical History:  Diagnosis Date   Allergic reaction 02/23/2020   Itchy mouth with watermelon and cucumber.  Refer to allergist.   Allergic rhinitis    Allergy    Anemia 2021   Anxiety Since childhood   Blood in stool 2010   Patient reports hospitalization for blood in stool, diarrhea and extreme dehydration.  She was hospitalized at this time and a colonoscopy was completed.   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   Frequent headaches    GERD (gastroesophageal reflux disease)    Heart murmur    Hypertension    Iron deficiency anemia    Irregular menstrual bleeding 2015   Osteoarthritis of knee 2016   Patellofemoral pain syndrome 04/2015   Tinea corporis    Tonsil stone 2016   Vitamin D deficiency    No Known Allergies Past Surgical History:  Procedure Laterality Date   BREAST BIOPSY Left 08/2016   fibroadenoma   BREAST BIOPSY Left 01/15/2021   CESAREAN SECTION  2012   WISDOM TOOTH EXTRACTION     Family History  Problem Relation Age of Onset   Arthritis Mother    Hyperlipidemia Mother    Hypertension Mother    Learning disabilities Mother    Allergic  rhinitis Mother    Alcohol abuse Father    Depression Father    Vision loss Father    Asthma Brother    Hyperlipidemia Brother    Hypertension Brother    Allergic rhinitis Brother    Allergic rhinitis Son    Eczema Son    Urticaria Son    Bladder Cancer Maternal Grandmother    Early death Maternal Grandmother    Skin cancer Maternal Grandfather    Early death Maternal Grandfather    Stroke Maternal Grandfather    Hypertension Maternal Grandfather    Skin cancer  Paternal Grandmother    Skin cancer Paternal Grandfather    Asthma Brother    Hyperlipidemia Brother    Hypertension Brother    Allergic rhinitis Brother    Breast cancer Neg Hx    Social History   Social History Narrative   Marital status/children/pets: Married, 1 child.   Education/employment: High school graduate, stay-at-home mom   Safety:      -Wears a bicycle helmet riding a bike: Yes     -smoke alarm in the home:Yes     - wears seatbelt: Yes     - Feels safe in their relationships: Yes    Allergies as of 12/20/2022   No Known Allergies      Medication List        Accurate as of December 20, 2022  9:47 AM. If you have any questions, ask your nurse or doctor.          ALLEGRA-D 24 HOUR PO Take by mouth.   amLODipine 10 MG tablet Commonly known as: NORVASC Take 1 tablet (10 mg total) by mouth daily.   MULTI COMPLETE PO Take by mouth.   naproxen 500 MG tablet Commonly known as: Naprosyn Take 1 tablet (500 mg total) by mouth 2 (two) times daily as needed for mild pain.   Olopatadine HCl 0.6 % Soln Place 2 sprays into both nostrils 2 (two) times daily.        All past medical history, surgical history, allergies, family history, immunizations andmedications were updated in the EMR today and reviewed under the history and medication portions of their EMR.     No results found for this or any previous visit (from the past 2160 hour(s)).   ROS: 14 pt review of systems performed and negative (unless mentioned in an HPI)  Objective: BP 124/85   Pulse 80   Temp 98.5 F (36.9 C)   Ht '5\' 1"'$  (1.549 m)   Wt 172 lb 9.6 oz (78.3 kg)   LMP 12/14/2022   SpO2 100%   BMI 32.61 kg/m  Physical Exam Vitals and nursing note reviewed.  Constitutional:      General: She is not in acute distress.    Appearance: Normal appearance. She is not ill-appearing or toxic-appearing.  HENT:     Head: Normocephalic and atraumatic.     Right Ear: Tympanic membrane, ear  canal and external ear normal. There is no impacted cerumen.     Left Ear: Tympanic membrane, ear canal and external ear normal. There is no impacted cerumen.     Nose: No congestion or rhinorrhea.     Mouth/Throat:     Mouth: Mucous membranes are moist.     Pharynx: Oropharynx is clear. No oropharyngeal exudate or posterior oropharyngeal erythema.  Eyes:     General: No scleral icterus.       Right eye: No discharge.  Left eye: No discharge.     Extraocular Movements: Extraocular movements intact.     Conjunctiva/sclera: Conjunctivae normal.     Pupils: Pupils are equal, round, and reactive to light.  Cardiovascular:     Rate and Rhythm: Normal rate and regular rhythm.     Pulses: Normal pulses.     Heart sounds: Normal heart sounds. No murmur heard.    No friction rub. No gallop.  Pulmonary:     Effort: Pulmonary effort is normal. No respiratory distress.     Breath sounds: Normal breath sounds. No stridor. No wheezing, rhonchi or rales.  Chest:     Chest wall: No tenderness.  Abdominal:     General: Abdomen is flat. Bowel sounds are normal. There is no distension.     Palpations: Abdomen is soft. There is no mass.     Tenderness: There is no abdominal tenderness. There is no right CVA tenderness, left CVA tenderness, guarding or rebound.     Hernia: No hernia is present.  Musculoskeletal:        General: No swelling, tenderness or deformity. Normal range of motion.     Cervical back: Normal range of motion and neck supple. No rigidity or tenderness.     Right lower leg: No edema.     Left lower leg: No edema.  Lymphadenopathy:     Cervical: No cervical adenopathy.  Skin:    General: Skin is warm and dry.     Coloration: Skin is not jaundiced or pale.     Findings: No bruising, erythema, lesion or rash.  Neurological:     General: No focal deficit present.     Mental Status: She is alert and oriented to person, place, and time. Mental status is at baseline.      Cranial Nerves: No cranial nerve deficit.     Sensory: No sensory deficit.     Motor: No weakness.     Coordination: Coordination normal.     Gait: Gait normal.     Deep Tendon Reflexes: Reflexes normal.  Psychiatric:        Mood and Affect: Mood normal.        Behavior: Behavior normal.        Thought Content: Thought content normal.        Judgment: Judgment normal.     No results found.  Assessment/plan: Jacilyn Sanpedro is a 42 y.o. female present for cpe with Chronic Conditions/illness Management Primary hypertension/Obesity (BMI 30-39.9)/Palpitations stable Low sodium  Continue amlodipine 10 mg QD    Iron deficiency Iron panel collected She is taking supplement with cycle only  Severe menstrual cramps/Menorrhagia with regular cycle Stable Continue  naproxen a few days prior to menstrual cycle and through bleeding pattern.  Breast cancer screening by mammogram - MM 3D SCREEN BREAST BILATERAL; Future Vitamin D deficiency - Vitamin D (25 hydroxy) Routine general medical examination at a health care facility - CBC with Differential/Platelet - Comprehensive metabolic panel - Hemoglobin A1c - Lipid panel - TSH Colonoscopy: routine screen at 47 Mammogram: completed:01/2022 > placed order for 01/2023 [facility] Cervical cancer screening: last pap: 2021> pcp wnl-hpv- 5 yr Immunizations: tdap UTD 2017, Influenza UTD 2023(encouraged yearly) Infectious disease screening: HIV completed, Hep C completed DEXA: routine screen Patient was encouraged to exercise greater than 150 minutes a week. Patient was encouraged to choose a diet filled with fresh fruits and vegetables, and lean meats. AVS provided to patient today for education/recommendation on gender specific health and safety  maintenance.  Return in about 24 weeks (around 06/06/2023) for Routine chronic condition follow-up.   Orders Placed This Encounter  Procedures   MM 3D SCREEN BREAST BILATERAL   CBC with  Differential/Platelet   Comprehensive metabolic panel   Hemoglobin A1c   Lipid panel   TSH   Vitamin D (25 hydroxy)   IBC + Ferritin   Meds ordered this encounter  Medications   naproxen (NAPROSYN) 500 MG tablet    Sig: Take 1 tablet (500 mg total) by mouth 2 (two) times daily as needed for mild pain.    Dispense:  60 tablet    Refill:  5   amLODipine (NORVASC) 10 MG tablet    Sig: Take 1 tablet (10 mg total) by mouth daily.    Dispense:  90 tablet    Refill:  1   Referral Orders  No referral(s) requested today     Electronically signed by: Howard Pouch, [place]

## 2022-12-23 ENCOUNTER — Telehealth: Payer: Self-pay | Admitting: Family Medicine

## 2022-12-23 DIAGNOSIS — J301 Allergic rhinitis due to pollen: Secondary | ICD-10-CM | POA: Diagnosis not present

## 2022-12-23 DIAGNOSIS — J3089 Other allergic rhinitis: Secondary | ICD-10-CM | POA: Diagnosis not present

## 2022-12-23 DIAGNOSIS — J3081 Allergic rhinitis due to animal (cat) (dog) hair and dander: Secondary | ICD-10-CM | POA: Diagnosis not present

## 2022-12-23 NOTE — Telephone Encounter (Signed)
Spoke with patient regarding results/recommendations.  

## 2022-12-23 NOTE — Telephone Encounter (Signed)
Spoke with patient regarding results/recommendations. Pt is concerned about her vit d and platelet being out of normal range

## 2022-12-23 NOTE — Telephone Encounter (Signed)
Please call patient Liver, kidney and thyroid function are normal Blood cell counts and electrolytes are normal Diabetes screening/A1c is normal  Cholesterol panel is at goal Iron panel is above goal.  She longer needs to supplement with any iron.

## 2022-12-23 NOTE — Telephone Encounter (Signed)
My apologies, I forgot to mention the vitamin D in my message.   Vitamin D is mildly low.  I would encourage her to start a 1000 unit vitamin D3 over-the-counter daily.  This will be enough to bring her up into normal range. As far as her platelets go, they are stable for her.  I did trend her platelets back and they have been stable since before 2021.  Mildly elevated platelets is a common finding, especially in people who have struggled with iron levels in the past.  Not concerning as long as stable

## 2022-12-25 DIAGNOSIS — J3089 Other allergic rhinitis: Secondary | ICD-10-CM | POA: Diagnosis not present

## 2022-12-25 DIAGNOSIS — J3081 Allergic rhinitis due to animal (cat) (dog) hair and dander: Secondary | ICD-10-CM | POA: Diagnosis not present

## 2022-12-25 DIAGNOSIS — J301 Allergic rhinitis due to pollen: Secondary | ICD-10-CM | POA: Diagnosis not present

## 2022-12-27 DIAGNOSIS — J3081 Allergic rhinitis due to animal (cat) (dog) hair and dander: Secondary | ICD-10-CM | POA: Diagnosis not present

## 2022-12-27 DIAGNOSIS — J3089 Other allergic rhinitis: Secondary | ICD-10-CM | POA: Diagnosis not present

## 2022-12-27 DIAGNOSIS — J301 Allergic rhinitis due to pollen: Secondary | ICD-10-CM | POA: Diagnosis not present

## 2022-12-30 DIAGNOSIS — J3081 Allergic rhinitis due to animal (cat) (dog) hair and dander: Secondary | ICD-10-CM | POA: Diagnosis not present

## 2022-12-30 DIAGNOSIS — J3089 Other allergic rhinitis: Secondary | ICD-10-CM | POA: Diagnosis not present

## 2022-12-30 DIAGNOSIS — J301 Allergic rhinitis due to pollen: Secondary | ICD-10-CM | POA: Diagnosis not present

## 2023-01-01 DIAGNOSIS — J301 Allergic rhinitis due to pollen: Secondary | ICD-10-CM | POA: Diagnosis not present

## 2023-01-01 DIAGNOSIS — J3081 Allergic rhinitis due to animal (cat) (dog) hair and dander: Secondary | ICD-10-CM | POA: Diagnosis not present

## 2023-01-01 DIAGNOSIS — J3089 Other allergic rhinitis: Secondary | ICD-10-CM | POA: Diagnosis not present

## 2023-01-03 DIAGNOSIS — J301 Allergic rhinitis due to pollen: Secondary | ICD-10-CM | POA: Diagnosis not present

## 2023-01-03 DIAGNOSIS — J3089 Other allergic rhinitis: Secondary | ICD-10-CM | POA: Diagnosis not present

## 2023-01-03 DIAGNOSIS — J3081 Allergic rhinitis due to animal (cat) (dog) hair and dander: Secondary | ICD-10-CM | POA: Diagnosis not present

## 2023-01-06 DIAGNOSIS — J3089 Other allergic rhinitis: Secondary | ICD-10-CM | POA: Diagnosis not present

## 2023-01-06 DIAGNOSIS — J301 Allergic rhinitis due to pollen: Secondary | ICD-10-CM | POA: Diagnosis not present

## 2023-01-06 DIAGNOSIS — J3081 Allergic rhinitis due to animal (cat) (dog) hair and dander: Secondary | ICD-10-CM | POA: Diagnosis not present

## 2023-01-08 DIAGNOSIS — J3089 Other allergic rhinitis: Secondary | ICD-10-CM | POA: Diagnosis not present

## 2023-01-08 DIAGNOSIS — J3081 Allergic rhinitis due to animal (cat) (dog) hair and dander: Secondary | ICD-10-CM | POA: Diagnosis not present

## 2023-01-08 DIAGNOSIS — J301 Allergic rhinitis due to pollen: Secondary | ICD-10-CM | POA: Diagnosis not present

## 2023-01-10 DIAGNOSIS — J3089 Other allergic rhinitis: Secondary | ICD-10-CM | POA: Diagnosis not present

## 2023-01-10 DIAGNOSIS — J301 Allergic rhinitis due to pollen: Secondary | ICD-10-CM | POA: Diagnosis not present

## 2023-01-10 DIAGNOSIS — J3081 Allergic rhinitis due to animal (cat) (dog) hair and dander: Secondary | ICD-10-CM | POA: Diagnosis not present

## 2023-01-13 DIAGNOSIS — J3081 Allergic rhinitis due to animal (cat) (dog) hair and dander: Secondary | ICD-10-CM | POA: Diagnosis not present

## 2023-01-13 DIAGNOSIS — J301 Allergic rhinitis due to pollen: Secondary | ICD-10-CM | POA: Diagnosis not present

## 2023-01-13 DIAGNOSIS — J3089 Other allergic rhinitis: Secondary | ICD-10-CM | POA: Diagnosis not present

## 2023-01-17 DIAGNOSIS — J3089 Other allergic rhinitis: Secondary | ICD-10-CM | POA: Diagnosis not present

## 2023-01-17 DIAGNOSIS — J3081 Allergic rhinitis due to animal (cat) (dog) hair and dander: Secondary | ICD-10-CM | POA: Diagnosis not present

## 2023-01-17 DIAGNOSIS — J301 Allergic rhinitis due to pollen: Secondary | ICD-10-CM | POA: Diagnosis not present

## 2023-01-20 DIAGNOSIS — J3081 Allergic rhinitis due to animal (cat) (dog) hair and dander: Secondary | ICD-10-CM | POA: Diagnosis not present

## 2023-01-20 DIAGNOSIS — J3089 Other allergic rhinitis: Secondary | ICD-10-CM | POA: Diagnosis not present

## 2023-01-20 DIAGNOSIS — J301 Allergic rhinitis due to pollen: Secondary | ICD-10-CM | POA: Diagnosis not present

## 2023-01-24 DIAGNOSIS — J3089 Other allergic rhinitis: Secondary | ICD-10-CM | POA: Diagnosis not present

## 2023-01-24 DIAGNOSIS — J3081 Allergic rhinitis due to animal (cat) (dog) hair and dander: Secondary | ICD-10-CM | POA: Diagnosis not present

## 2023-01-24 DIAGNOSIS — J301 Allergic rhinitis due to pollen: Secondary | ICD-10-CM | POA: Diagnosis not present

## 2023-01-27 DIAGNOSIS — J3081 Allergic rhinitis due to animal (cat) (dog) hair and dander: Secondary | ICD-10-CM | POA: Diagnosis not present

## 2023-01-27 DIAGNOSIS — J301 Allergic rhinitis due to pollen: Secondary | ICD-10-CM | POA: Diagnosis not present

## 2023-01-27 DIAGNOSIS — J3089 Other allergic rhinitis: Secondary | ICD-10-CM | POA: Diagnosis not present

## 2023-02-03 DIAGNOSIS — J3081 Allergic rhinitis due to animal (cat) (dog) hair and dander: Secondary | ICD-10-CM | POA: Diagnosis not present

## 2023-02-03 DIAGNOSIS — J301 Allergic rhinitis due to pollen: Secondary | ICD-10-CM | POA: Diagnosis not present

## 2023-02-03 DIAGNOSIS — J3089 Other allergic rhinitis: Secondary | ICD-10-CM | POA: Diagnosis not present

## 2023-02-10 DIAGNOSIS — J301 Allergic rhinitis due to pollen: Secondary | ICD-10-CM | POA: Diagnosis not present

## 2023-02-10 DIAGNOSIS — J3089 Other allergic rhinitis: Secondary | ICD-10-CM | POA: Diagnosis not present

## 2023-02-24 DIAGNOSIS — J3081 Allergic rhinitis due to animal (cat) (dog) hair and dander: Secondary | ICD-10-CM | POA: Diagnosis not present

## 2023-02-24 DIAGNOSIS — J3089 Other allergic rhinitis: Secondary | ICD-10-CM | POA: Diagnosis not present

## 2023-02-24 DIAGNOSIS — J301 Allergic rhinitis due to pollen: Secondary | ICD-10-CM | POA: Diagnosis not present

## 2023-02-26 ENCOUNTER — Other Ambulatory Visit: Payer: Self-pay | Admitting: Family Medicine

## 2023-02-26 DIAGNOSIS — N6320 Unspecified lump in the left breast, unspecified quadrant: Secondary | ICD-10-CM

## 2023-02-27 ENCOUNTER — Inpatient Hospital Stay: Admission: RE | Admit: 2023-02-27 | Payer: Federal, State, Local not specified - PPO | Source: Ambulatory Visit

## 2023-03-03 DIAGNOSIS — J3081 Allergic rhinitis due to animal (cat) (dog) hair and dander: Secondary | ICD-10-CM | POA: Diagnosis not present

## 2023-03-03 DIAGNOSIS — J301 Allergic rhinitis due to pollen: Secondary | ICD-10-CM | POA: Diagnosis not present

## 2023-03-03 DIAGNOSIS — J3089 Other allergic rhinitis: Secondary | ICD-10-CM | POA: Diagnosis not present

## 2023-03-05 ENCOUNTER — Ambulatory Visit
Admission: RE | Admit: 2023-03-05 | Discharge: 2023-03-05 | Disposition: A | Discharge status: Home / Self Care | Payer: Federal, State, Local not specified - PPO | Source: Ambulatory Visit | Attending: Family Medicine | Admitting: Family Medicine

## 2023-03-05 DIAGNOSIS — N6320 Unspecified lump in the left breast, unspecified quadrant: Secondary | ICD-10-CM

## 2023-03-05 DIAGNOSIS — R922 Inconclusive mammogram: Secondary | ICD-10-CM | POA: Diagnosis not present

## 2023-03-05 DIAGNOSIS — N6321 Unspecified lump in the left breast, upper outer quadrant: Secondary | ICD-10-CM | POA: Diagnosis not present

## 2023-03-10 DIAGNOSIS — J3089 Other allergic rhinitis: Secondary | ICD-10-CM | POA: Diagnosis not present

## 2023-03-10 DIAGNOSIS — J301 Allergic rhinitis due to pollen: Secondary | ICD-10-CM | POA: Diagnosis not present

## 2023-03-10 DIAGNOSIS — J3081 Allergic rhinitis due to animal (cat) (dog) hair and dander: Secondary | ICD-10-CM | POA: Diagnosis not present

## 2023-03-18 DIAGNOSIS — J3089 Other allergic rhinitis: Secondary | ICD-10-CM | POA: Diagnosis not present

## 2023-03-18 DIAGNOSIS — J301 Allergic rhinitis due to pollen: Secondary | ICD-10-CM | POA: Diagnosis not present

## 2023-03-18 DIAGNOSIS — J3081 Allergic rhinitis due to animal (cat) (dog) hair and dander: Secondary | ICD-10-CM | POA: Diagnosis not present

## 2023-03-21 DIAGNOSIS — F411 Generalized anxiety disorder: Secondary | ICD-10-CM | POA: Diagnosis not present

## 2023-03-24 DIAGNOSIS — J3089 Other allergic rhinitis: Secondary | ICD-10-CM | POA: Diagnosis not present

## 2023-03-24 DIAGNOSIS — J3081 Allergic rhinitis due to animal (cat) (dog) hair and dander: Secondary | ICD-10-CM | POA: Diagnosis not present

## 2023-03-24 DIAGNOSIS — J301 Allergic rhinitis due to pollen: Secondary | ICD-10-CM | POA: Diagnosis not present

## 2023-03-31 DIAGNOSIS — J301 Allergic rhinitis due to pollen: Secondary | ICD-10-CM | POA: Diagnosis not present

## 2023-03-31 DIAGNOSIS — J3089 Other allergic rhinitis: Secondary | ICD-10-CM | POA: Diagnosis not present

## 2023-03-31 DIAGNOSIS — J3081 Allergic rhinitis due to animal (cat) (dog) hair and dander: Secondary | ICD-10-CM | POA: Diagnosis not present

## 2023-04-04 DIAGNOSIS — F411 Generalized anxiety disorder: Secondary | ICD-10-CM | POA: Diagnosis not present

## 2023-04-07 DIAGNOSIS — J301 Allergic rhinitis due to pollen: Secondary | ICD-10-CM | POA: Diagnosis not present

## 2023-04-07 DIAGNOSIS — J3081 Allergic rhinitis due to animal (cat) (dog) hair and dander: Secondary | ICD-10-CM | POA: Diagnosis not present

## 2023-04-07 DIAGNOSIS — J3089 Other allergic rhinitis: Secondary | ICD-10-CM | POA: Diagnosis not present

## 2023-04-15 DIAGNOSIS — J3089 Other allergic rhinitis: Secondary | ICD-10-CM | POA: Diagnosis not present

## 2023-04-15 DIAGNOSIS — J301 Allergic rhinitis due to pollen: Secondary | ICD-10-CM | POA: Diagnosis not present

## 2023-04-16 DIAGNOSIS — J301 Allergic rhinitis due to pollen: Secondary | ICD-10-CM | POA: Diagnosis not present

## 2023-04-17 DIAGNOSIS — J3089 Other allergic rhinitis: Secondary | ICD-10-CM | POA: Diagnosis not present

## 2023-04-18 DIAGNOSIS — F411 Generalized anxiety disorder: Secondary | ICD-10-CM | POA: Diagnosis not present

## 2023-04-21 DIAGNOSIS — J301 Allergic rhinitis due to pollen: Secondary | ICD-10-CM | POA: Diagnosis not present

## 2023-04-21 DIAGNOSIS — J3089 Other allergic rhinitis: Secondary | ICD-10-CM | POA: Diagnosis not present

## 2023-04-21 DIAGNOSIS — J3081 Allergic rhinitis due to animal (cat) (dog) hair and dander: Secondary | ICD-10-CM | POA: Diagnosis not present

## 2023-04-28 DIAGNOSIS — J301 Allergic rhinitis due to pollen: Secondary | ICD-10-CM | POA: Diagnosis not present

## 2023-04-28 DIAGNOSIS — J3089 Other allergic rhinitis: Secondary | ICD-10-CM | POA: Diagnosis not present

## 2023-04-28 DIAGNOSIS — J3081 Allergic rhinitis due to animal (cat) (dog) hair and dander: Secondary | ICD-10-CM | POA: Diagnosis not present

## 2023-05-05 DIAGNOSIS — J3089 Other allergic rhinitis: Secondary | ICD-10-CM | POA: Diagnosis not present

## 2023-05-05 DIAGNOSIS — J3081 Allergic rhinitis due to animal (cat) (dog) hair and dander: Secondary | ICD-10-CM | POA: Diagnosis not present

## 2023-05-05 DIAGNOSIS — J301 Allergic rhinitis due to pollen: Secondary | ICD-10-CM | POA: Diagnosis not present

## 2023-05-19 DIAGNOSIS — J3089 Other allergic rhinitis: Secondary | ICD-10-CM | POA: Diagnosis not present

## 2023-05-19 DIAGNOSIS — J3081 Allergic rhinitis due to animal (cat) (dog) hair and dander: Secondary | ICD-10-CM | POA: Diagnosis not present

## 2023-05-19 DIAGNOSIS — J301 Allergic rhinitis due to pollen: Secondary | ICD-10-CM | POA: Diagnosis not present

## 2023-05-28 DIAGNOSIS — J301 Allergic rhinitis due to pollen: Secondary | ICD-10-CM | POA: Diagnosis not present

## 2023-05-28 DIAGNOSIS — J3089 Other allergic rhinitis: Secondary | ICD-10-CM | POA: Diagnosis not present

## 2023-05-28 DIAGNOSIS — J3081 Allergic rhinitis due to animal (cat) (dog) hair and dander: Secondary | ICD-10-CM | POA: Diagnosis not present

## 2023-06-03 DIAGNOSIS — J3089 Other allergic rhinitis: Secondary | ICD-10-CM | POA: Diagnosis not present

## 2023-06-03 DIAGNOSIS — J3081 Allergic rhinitis due to animal (cat) (dog) hair and dander: Secondary | ICD-10-CM | POA: Diagnosis not present

## 2023-06-03 DIAGNOSIS — J301 Allergic rhinitis due to pollen: Secondary | ICD-10-CM | POA: Diagnosis not present

## 2023-06-04 ENCOUNTER — Ambulatory Visit (INDEPENDENT_AMBULATORY_CARE_PROVIDER_SITE_OTHER): Payer: Federal, State, Local not specified - PPO | Admitting: Radiology

## 2023-06-04 ENCOUNTER — Encounter: Payer: Self-pay | Admitting: Radiology

## 2023-06-04 ENCOUNTER — Other Ambulatory Visit (HOSPITAL_COMMUNITY)
Admission: RE | Admit: 2023-06-04 | Discharge: 2023-06-04 | Disposition: A | Discharge status: Home / Self Care | Payer: Federal, State, Local not specified - PPO | Source: Ambulatory Visit | Attending: Radiology | Admitting: Radiology

## 2023-06-04 VITALS — BP 126/82 | HR 84 | Ht 61.5 in | Wt 172.0 lb

## 2023-06-04 DIAGNOSIS — N946 Dysmenorrhea, unspecified: Secondary | ICD-10-CM

## 2023-06-04 DIAGNOSIS — Z01419 Encounter for gynecological examination (general) (routine) without abnormal findings: Secondary | ICD-10-CM | POA: Diagnosis not present

## 2023-06-04 DIAGNOSIS — Z862 Personal history of diseases of the blood and blood-forming organs and certain disorders involving the immune mechanism: Secondary | ICD-10-CM | POA: Diagnosis not present

## 2023-06-04 DIAGNOSIS — E559 Vitamin D deficiency, unspecified: Secondary | ICD-10-CM | POA: Diagnosis not present

## 2023-06-04 DIAGNOSIS — R5382 Chronic fatigue, unspecified: Secondary | ICD-10-CM

## 2023-06-04 MED ORDER — NAPROXEN 500 MG PO TABS: 500.0000 mg | ORAL_TABLET | Freq: Two times a day (BID) | ORAL | 4 refills | Status: DC | PRN

## 2023-06-04 NOTE — Progress Notes (Signed)
   Maria Goodwin Oct 22, 1981 [id number]   History:  42 y.o. G1P1 presents for annual exam. Uses naproxen for dysmenorrhea with good relief. Hx of anemia and vit D deficiency. Reports fatigue worse before menses.  Gynecologic History Patient's last menstrual period was 05/17/2023. Period Cycle (Days): 28 Period Duration (Days): 4-5 Period Pattern: Regular Menstrual Flow: Heavy, Light Menstrual Control: Tampon, Maxi pad Dysmenorrhea: (!) Mild Contraception/Family planning: vasectomy Sexually active: yes Last Pap: 2021. Results were: normal Last mammogram: 2024. Results were: normal  Obstetric History OB History  Gravida Para Term Preterm AB Living  1 1       1   SAB IAB Ectopic Multiple Live Births               # Outcome Date GA Lbr Len/2nd Weight Sex Type Anes PTL Lv  1 Para              The following portions of the patient's history were reviewed and updated as appropriate: allergies, current medications, past family history, past medical history, past social history, past surgical history, and problem list.  Review of Systems Pertinent items noted in HPI and remainder of comprehensive ROS otherwise negative.   Past medical history, past surgical history, family history and social history were all reviewed and documented in the EPIC chart.   Exam:  Vitals:   06/04/23 0840  BP: 126/82  Pulse: 84  SpO2: 100%  Weight: 172 lb (78 kg)  Height: 5' 1.5" (1.562 m)   Body mass index is 31.97 kg/m.  General appearance:  Normal Thyroid:  Symmetrical, normal in size, without palpable masses or nodularity. Respiratory  Auscultation:  Clear without wheezing or rhonchi Cardiovascular  Auscultation:  Regular rate, without rubs, murmurs or gallops  Edema/varicosities:  Not grossly evident Abdominal  Soft,nontender, without masses, guarding or rebound.  Liver/spleen:  No organomegaly noted  Hernia:  None appreciated  Skin  Inspection:  Grossly normal Breasts: Examined  lying and sitting.   Right: Without masses, retractions, nipple discharge or axillary adenopathy.   Left: Without masses, retractions, nipple discharge or axillary adenopathy. Genitourinary   Inguinal/mons:  Normal without inguinal adenopathy  External genitalia:  Normal appearing vulva with no masses, tenderness, or lesions  BUS/Urethra/Skene's glands:  Normal without masses or exudate  Vagina:  Normal appearing with normal color and discharge, no lesions  Cervix:  Normal appearing without discharge or lesions  Uterus:  Normal in size, shape and contour.  Mobile, nontender  Adnexa/parametria:     Rt: Normal in size, without masses or tenderness.   Lt: Normal in size, without masses or tenderness.  Anus and perineum: Normal   Raynelle Fanning, CMA present for exam  Assessment/Plan:   1. Well woman exam with routine gynecological exam - Cytology - PAP( [facility])  2. Vitamin D deficiency - Vitamin D (25 hydroxy)  3. Chronic fatigue - Thyroid Panel With TSH  4. History of anemia - CBC - Ferritin - B12 and Folate Panel  5. Dysmenorrhea - naproxen (NAPROSYN) 500 MG tablet; Take 1 tablet (500 mg total) by mouth 2 (two) times daily as needed for mild pain.  Dispense: 60 tablet; Refill: 4     Discussed SBE, colonoscopy and DEXA screening as directed/appropriate. Recommend of exercise weekly, including weight bearing exercise. Encouraged the use of seatbelts and sunscreen. Return in 1 year for annual or as needed.   Arlie Solomons B WHNP-BC 9:04 AM 06/04/2023

## 2023-06-05 DIAGNOSIS — L708 Other acne: Secondary | ICD-10-CM | POA: Diagnosis not present

## 2023-06-05 DIAGNOSIS — L700 Acne vulgaris: Secondary | ICD-10-CM | POA: Diagnosis not present

## 2023-06-05 DIAGNOSIS — L814 Other melanin hyperpigmentation: Secondary | ICD-10-CM | POA: Diagnosis not present

## 2023-06-05 DIAGNOSIS — L821 Other seborrheic keratosis: Secondary | ICD-10-CM | POA: Diagnosis not present

## 2023-06-05 LAB — CBC
HCT: 38.3 % (ref 35.0–45.0)
Hemoglobin: 13.1 g/dL (ref 11.7–15.5)
MCH: 30 pg (ref 27.0–33.0)
MCHC: 34.2 g/dL (ref 32.0–36.0)
MCV: 87.8 fL (ref 80.0–100.0)
MPV: 9.7 fL (ref 7.5–12.5)
Platelets: 487 10*3/uL — ABNORMAL HIGH (ref 140–400)
RBC: 4.36 10*6/uL (ref 3.80–5.10)
RDW: 12.5 % (ref 11.0–15.0)
WBC: 10.3 10*3/uL (ref 3.8–10.8)

## 2023-06-05 LAB — B12 AND FOLATE PANEL
Folate: >24 ng/mL
Vitamin B-12: 778 pg/mL (ref 200–1100)

## 2023-06-05 LAB — CYTOLOGY - PAP
Comment: NEGATIVE
Diagnosis: NEGATIVE
High risk HPV: NEGATIVE

## 2023-06-05 LAB — VITAMIN D 25 HYDROXY (VIT D DEFICIENCY, FRACTURES): Vit D, 25-Hydroxy: 44 ng/mL (ref 30–100)

## 2023-06-05 LAB — THYROID PANEL WITH TSH
Free Thyroxine Index: 2.1 (ref 1.4–3.8)
T3 Uptake: 27 % (ref 22–35)
T4, Total: 7.6 ug/dL (ref 5.1–11.9)
TSH: 1.04 mIU/L

## 2023-06-05 LAB — FERRITIN: Ferritin: 15 ng/mL — ABNORMAL LOW (ref 16–232)

## 2023-06-06 ENCOUNTER — Ambulatory Visit: Payer: Federal, State, Local not specified - PPO | Admitting: Family Medicine

## 2023-06-06 NOTE — Telephone Encounter (Signed)
Platelets can be elevated from iron deficiency.

## 2023-06-09 DIAGNOSIS — J3081 Allergic rhinitis due to animal (cat) (dog) hair and dander: Secondary | ICD-10-CM | POA: Diagnosis not present

## 2023-06-09 DIAGNOSIS — J301 Allergic rhinitis due to pollen: Secondary | ICD-10-CM | POA: Diagnosis not present

## 2023-06-09 DIAGNOSIS — J3089 Other allergic rhinitis: Secondary | ICD-10-CM | POA: Diagnosis not present

## 2023-06-16 ENCOUNTER — Other Ambulatory Visit: Payer: Self-pay | Admitting: Family Medicine

## 2023-06-19 DIAGNOSIS — J301 Allergic rhinitis due to pollen: Secondary | ICD-10-CM | POA: Diagnosis not present

## 2023-06-19 DIAGNOSIS — J3089 Other allergic rhinitis: Secondary | ICD-10-CM | POA: Diagnosis not present

## 2023-06-19 DIAGNOSIS — J3081 Allergic rhinitis due to animal (cat) (dog) hair and dander: Secondary | ICD-10-CM | POA: Diagnosis not present

## 2023-07-03 ENCOUNTER — Encounter (INDEPENDENT_AMBULATORY_CARE_PROVIDER_SITE_OTHER): Payer: Self-pay

## 2023-07-07 DIAGNOSIS — J3089 Other allergic rhinitis: Secondary | ICD-10-CM | POA: Diagnosis not present

## 2023-07-07 DIAGNOSIS — J301 Allergic rhinitis due to pollen: Secondary | ICD-10-CM | POA: Diagnosis not present

## 2023-07-07 DIAGNOSIS — J3081 Allergic rhinitis due to animal (cat) (dog) hair and dander: Secondary | ICD-10-CM | POA: Diagnosis not present

## 2023-07-09 ENCOUNTER — Other Ambulatory Visit: Payer: Self-pay | Admitting: Family Medicine

## 2023-07-13 ENCOUNTER — Other Ambulatory Visit: Payer: Self-pay | Admitting: Family Medicine

## 2023-07-15 DIAGNOSIS — J301 Allergic rhinitis due to pollen: Secondary | ICD-10-CM | POA: Diagnosis not present

## 2023-07-15 DIAGNOSIS — J3089 Other allergic rhinitis: Secondary | ICD-10-CM | POA: Diagnosis not present

## 2023-07-17 ENCOUNTER — Other Ambulatory Visit: Payer: Self-pay | Admitting: Medical Genetics

## 2023-07-17 DIAGNOSIS — Z006 Encounter for examination for normal comparison and control in clinical research program: Secondary | ICD-10-CM

## 2023-07-25 ENCOUNTER — Other Ambulatory Visit (HOSPITAL_COMMUNITY)
Admission: RE | Admit: 2023-07-25 | Discharge: 2023-07-25 | Disposition: A | Discharge status: Home / Self Care | Payer: Federal, State, Local not specified - PPO | Attending: Oncology | Admitting: Oncology

## 2023-07-25 DIAGNOSIS — Z006 Encounter for examination for normal comparison and control in clinical research program: Secondary | ICD-10-CM | POA: Insufficient documentation

## 2023-07-30 ENCOUNTER — Ambulatory Visit: Payer: Federal, State, Local not specified - PPO | Admitting: Family Medicine

## 2023-07-30 VITALS — BP 134/88 | HR 85 | Temp 97.9°F | Wt 173.6 lb

## 2023-07-30 DIAGNOSIS — R1314 Dysphagia, pharyngoesophageal phase: Secondary | ICD-10-CM | POA: Insufficient documentation

## 2023-07-30 DIAGNOSIS — I10 Essential (primary) hypertension: Secondary | ICD-10-CM

## 2023-07-30 DIAGNOSIS — E611 Iron deficiency: Secondary | ICD-10-CM | POA: Insufficient documentation

## 2023-07-30 DIAGNOSIS — E559 Vitamin D deficiency, unspecified: Secondary | ICD-10-CM | POA: Diagnosis not present

## 2023-07-30 DIAGNOSIS — J351 Hypertrophy of tonsils: Secondary | ICD-10-CM

## 2023-07-30 DIAGNOSIS — Z23 Encounter for immunization: Secondary | ICD-10-CM | POA: Diagnosis not present

## 2023-07-30 LAB — IBC + FERRITIN
Ferritin: 41.6 ng/mL (ref 10.0–291.0)
Iron: 79 ug/dL (ref 42–145)
Saturation Ratios: 23.9 % (ref 20.0–50.0)
TIBC: 330.4 ug/dL (ref 250.0–450.0)
Transferrin: 236 mg/dL (ref 212.0–360.0)

## 2023-07-30 MED ORDER — OMEPRAZOLE 40 MG PO CPDR: 40.0000 mg | DELAYED_RELEASE_CAPSULE | Freq: Every day | ORAL | 1 refills | Status: DC

## 2023-07-30 MED ORDER — AMLODIPINE BESYLATE 10 MG PO TABS: 10.0000 mg | ORAL_TABLET | Freq: Every day | ORAL | 1 refills | Status: DC

## 2023-07-30 NOTE — Progress Notes (Signed)
Patient ID: Maria Goodwin, female  DOB: 08-Nov-1981, 42 y.o.   MRN: [id number] Patient Care Team    Relationship Specialty Notifications Start End  Natalia Leatherwood, DO PCP - General Family Medicine  02/22/20   Tanda Rockers, NP Nurse Practitioner Obstetrics and Gynecology  06/04/23     Chief Complaint  Patient presents with   Hypertension    Subjective: Maria Goodwin is a 42 y.o.  Female  present for Chronic Conditions/illness Management All past medical history, surgical history, allergies, family history, immunizations, medications and social history were updated in the electronic medical record today. All recent labs, ED visits and hospitalizations within the last year were reviewed.    Hypertension/obesity/palpitations:  Pt reports compliance with amlodipine 10 mg daily. Blood pressures ranges at home routinely checked. Patient denies chest pain, shortness of breath, dizziness or lower extremity edema.  Pt is not taking a daily baby ASA. Pt is not prescribed a prescribed statin. RF: Hypertension, obesity, family history of hypertension, hyperlipidemia and stroke  IDA/elevated platelets: Patient has a history of iron deficiency anemia.  She had been taking iron with her menstrual cycles and was found to have elevated iron levels of 153 and an elevated saturation ratio of 50.6 in February 2024.  Her ferritin at that time was 18.4.  Transferrin 216.  She was advised to stop iron supplement due to elevated saturations and iron levels in her blood, especially since she was only taking a few times a month.  She was retested at her gynecology office for her ferritin in July 2024 and her ferritin had dropped from 18.4-15 in those 5 months.  A full iron panel was not collected at that time.  Swallowing difficulty: Patient reports she has experienced swallowing difficulty for couple years, worsening over the last year.  She reports she does have enlarged tonsils and food will get stuck  in her tonsils at times.  She also experiences tonsil stones on occasions.  She states when she does get food caught up in her esophagus it is not stuck or cause choking, it is more of a very slow transition down her esophagus.  This starts towards the back of her throat.  She states when it does occur, it seems to occur more frequently for about a week and then she can go days to weeks without any recurrent symptoms.  She has not noticed any difficulty with swallowing liquids.  She has not noticed any particular type of food that is more prone to cause symptoms.  She states sometimes she will cough for have to regurgitate the food.  She so had noticed voice changes during this time she will become hoarse.  She does endorse having reflux symptoms and will take Tums during that time.  She also has chronic allergies in which she is being treated through asthma and allergy.     07/30/2023    8:21 AM 12/20/2022    3:26 PM 10/15/2021    2:37 PM 09/14/2020    8:01 AM 02/22/2020    1:25 PM  Depression screen PHQ 2/9  Decreased Interest 0 0 0 0 0  Down, Depressed, Hopeless 0 0 0 0 0  PHQ - 2 Score 0 0 0 0 0  Altered sleeping  0     PHQ-9 Score  0          No data to display           Immunization History  Administered Date(s)  Administered   Hepatitis A 08/15/2016, 02/11/2017   Hepatitis B 01/31/1987   Influenza,inj,Quad PF,6+ Mos 07/21/2021, 07/05/2022   Influenza-Unspecified 09/07/2019, 08/02/2020, 07/25/2023   PFIZER Comirnaty(Gray Top)Covid-19 Tri-Sucrose Vaccine 08/13/2022   PFIZER(Purple Top)SARS-COV-2 Vaccination 02/08/2020, 02/29/2020, 09/15/2020   Pfizer Covid-19 Vaccine Bivalent Booster 59yrs & up 08/09/2021   Tdap 08/15/2016     Past Medical History:  Diagnosis Date   Allergic reaction 02/23/2020   Itchy mouth with watermelon and cucumber.  Refer to allergist.   Allergic rhinitis    Allergy    Anemia 2021   Anxiety Since childhood   Blood in stool 2010   Patient reports  hospitalization for blood in stool, diarrhea and extreme dehydration.  She was hospitalized at this time and a colonoscopy was completed.   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   Frequent headaches    GERD (gastroesophageal reflux disease)    Heart murmur    Hypertension    Iron deficiency anemia    Irregular menstrual bleeding 2015   Numbness and tingling of upper extremity 02/23/2020   Osteoarthritis of knee 2016   Other allergic rhinitis 03/23/2020   Patellofemoral pain syndrome 04/2015   Tinea corporis    Tonsil stone 2016   Vitamin D deficiency    No Known Allergies Past Surgical History:  Procedure Laterality Date   BREAST BIOPSY Left 08/2016   fibroadenoma   BREAST BIOPSY Left 01/15/2021   BREAST BIOPSY Left 2023   CESAREAN SECTION  2012   WISDOM TOOTH EXTRACTION     Family History  Problem Relation Age of Onset   Arthritis Mother    Hyperlipidemia Mother    Hypertension Mother    Learning disabilities Mother    Allergic rhinitis Mother    ALS Mother    Alcohol abuse Father    Depression Father    Vision loss Father    Asthma Brother    Hyperlipidemia Brother    Hypertension Brother    Allergic rhinitis Brother    Asthma Brother    Hyperlipidemia Brother    Hypertension Brother    Allergic rhinitis Brother    Bladder Cancer Maternal Grandmother    Early death Maternal Grandmother    Skin cancer Maternal Grandfather    Early death Maternal Grandfather    Stroke Maternal Grandfather    Hypertension Maternal Grandfather    Skin cancer Paternal Grandmother    Skin cancer Paternal Grandfather    Allergic rhinitis Son    Eczema Son    Urticaria Son    Breast cancer Neg Hx    Social History   Social History Narrative   Marital status/children/pets: Married, 1 child.   Education/employment: High school graduate, stay-at-home mom   Safety:      -Wears a bicycle helmet riding a bike: Yes     -smoke alarm in the home:Yes     - wears seatbelt:  Yes     - Feels safe in their relationships: Yes    Allergies as of 07/30/2023   No Known Allergies      Medication List        Accurate as of July 30, 2023  4:39 PM. If you have any questions, ask your nurse or doctor.          STOP taking these medications    Olopatadine HCl 0.6 % Soln Stopped by: Felix Pacini       TAKE these medications    ALLEGRA-D 24 HOUR PO Take  by mouth.   amLODipine 10 MG tablet Commonly known as: NORVASC Take 1 tablet (10 mg total) by mouth daily.   EPINEPHrine 0.3 mg/0.3 mL Soaj injection Commonly known as: EPI-PEN See admin instructions.   MULTI COMPLETE PO Take by mouth.   naproxen 500 MG tablet Commonly known as: Naprosyn Take 1 tablet (500 mg total) by mouth 2 (two) times daily as needed for mild pain.   omeprazole 40 MG capsule Commonly known as: PRILOSEC Take 1 capsule (40 mg total) by mouth daily. Started by: Felix Pacini        All past medical history, surgical history, allergies, family history, immunizations andmedications were updated in the EMR today and reviewed under the history and medication portions of their EMR.        ROS: 14 pt review of systems performed and negative (unless mentioned in an HPI)  Objective: BP 134/88   Pulse 85   Temp 97.9 F (36.6 C)   Wt 173 lb 9.6 oz (78.7 kg)   SpO2 100%   BMI 32.27 kg/m  Physical Exam Vitals and nursing note reviewed.  Constitutional:      General: She is not in acute distress.    Appearance: Normal appearance. She is not ill-appearing, toxic-appearing or diaphoretic.  HENT:     Head: Normocephalic and atraumatic.  Eyes:     General: No scleral icterus.       Right eye: No discharge.        Left eye: No discharge.     Extraocular Movements: Extraocular movements intact.     Conjunctiva/sclera: Conjunctivae normal.     Pupils: Pupils are equal, round, and reactive to light.  Cardiovascular:     Rate and Rhythm: Normal rate and regular  rhythm.  Pulmonary:     Effort: Pulmonary effort is normal. No respiratory distress.     Breath sounds: Normal breath sounds. No wheezing, rhonchi or rales.  Musculoskeletal:     Right lower leg: No edema.     Left lower leg: No edema.  Skin:    General: Skin is warm.     Findings: No rash.  Neurological:     Mental Status: She is alert and oriented to person, place, and time. Mental status is at baseline.     Motor: No weakness.     Gait: Gait normal.  Psychiatric:        Mood and Affect: Mood normal.        Behavior: Behavior normal.        Thought Content: Thought content normal.        Judgment: Judgment normal.     No results found.  Assessment/plan: Maria Goodwin is a 42 y.o. female present for Chronic Conditions/illness Management Primary hypertension/Obesity (BMI 30-39.9)/Palpitations Stable Low sodium  Continue amlodipine 10 mg QD   Vitamin D deficiency: Vitamin D levels up-to-date 06/04/2023 and normal at 44. Continue vitamin D 1000-2000 mcg daily. She is unsure.   Iron deficiency She is taking supplement polysac 150 3x week.  Iron panel collected today to ensure not overloading. Discussed treating by results of entire iron panel and not treating just her her ferritin level is important for her   Severe menstrual cramps/Menorrhagia with regular cycle Stable Continue naproxen a few days prior to menstrual cycle and through bleeding pattern. Has prescription through her gynecology office currently.   Pharyngoesophageal dysphagia We discussed the different causes of dysphagia. Since she does have some reflux symptoms, we discussed a reflux diet.  She was encouraged to avoid laying down after eating by at least 3 hours. Start omeprazole 40 mg nightly ENT referral placed for her today for further evaluation.  We also discussed considering GI referral if needed at a later date.  Return in about 24 weeks (around 01/14/2024) for cpe (20 min), Routine chronic  condition follow-up.   Orders Placed This Encounter  Procedures   IBC + Ferritin   Ambulatory referral to ENT   Meds ordered this encounter  Medications   amLODipine (NORVASC) 10 MG tablet    Sig: Take 1 tablet (10 mg total) by mouth daily.    Dispense:  90 tablet    Refill:  1   omeprazole (PRILOSEC) 40 MG capsule    Sig: Take 1 capsule (40 mg total) by mouth daily.    Dispense:  90 capsule    Refill:  1   Referral Orders         Ambulatory referral to ENT       Electronically signed by: Felix Pacini, DO [facility] [place]

## 2023-07-30 NOTE — Patient Instructions (Addendum)
Return in about 24 weeks (around 01/14/2024) for cpe (20 min), Routine chronic condition follow-up.        Great to see you today.  I have refilled the medication(s) we provide.   If labs were collected or images ordered, we will inform you of  results once we have received them and reviewed. We will contact you either by echart message, or telephone call.  Please give ample time to the testing facility, and our office to run,  receive and review results. Please do not call inquiring of results, even if you can see them in your chart. We will contact you as soon as we are able. If it has been over 1 week since the test was completed, and you have not yet heard from [place], then please call us.    - echart message- for normal results that have been seen by the patient already.   - telephone call: abnormal results or if patient has not viewed results in their echart.  If a referral to a specialist was entered for you, please call us in 2 weeks if you have not heard from the specialist office to schedule.

## 2023-07-31 ENCOUNTER — Other Ambulatory Visit: Payer: Self-pay | Admitting: Family Medicine

## 2023-07-31 MED ORDER — POLYSACCHARIDE-IRON COMPLEX 150 MG PO CAPS: ORAL_CAPSULE | ORAL | 5 refills | Status: DC

## 2023-08-05 LAB — GENECONNECT MOLECULAR SCREEN: Genetic Analysis Overall Interpretation: NEGATIVE

## 2023-08-06 DIAGNOSIS — J3089 Other allergic rhinitis: Secondary | ICD-10-CM | POA: Diagnosis not present

## 2023-08-06 DIAGNOSIS — J301 Allergic rhinitis due to pollen: Secondary | ICD-10-CM | POA: Diagnosis not present

## 2023-08-06 DIAGNOSIS — J3081 Allergic rhinitis due to animal (cat) (dog) hair and dander: Secondary | ICD-10-CM | POA: Diagnosis not present

## 2023-08-12 DIAGNOSIS — J3081 Allergic rhinitis due to animal (cat) (dog) hair and dander: Secondary | ICD-10-CM | POA: Diagnosis not present

## 2023-08-12 DIAGNOSIS — J301 Allergic rhinitis due to pollen: Secondary | ICD-10-CM | POA: Diagnosis not present

## 2023-08-12 DIAGNOSIS — J3089 Other allergic rhinitis: Secondary | ICD-10-CM | POA: Diagnosis not present

## 2023-08-19 DIAGNOSIS — J301 Allergic rhinitis due to pollen: Secondary | ICD-10-CM | POA: Diagnosis not present

## 2023-08-19 DIAGNOSIS — J3089 Other allergic rhinitis: Secondary | ICD-10-CM | POA: Diagnosis not present

## 2023-08-19 DIAGNOSIS — J3081 Allergic rhinitis due to animal (cat) (dog) hair and dander: Secondary | ICD-10-CM | POA: Diagnosis not present

## 2023-08-26 DIAGNOSIS — J3089 Other allergic rhinitis: Secondary | ICD-10-CM | POA: Diagnosis not present

## 2023-08-26 DIAGNOSIS — J3081 Allergic rhinitis due to animal (cat) (dog) hair and dander: Secondary | ICD-10-CM | POA: Diagnosis not present

## 2023-08-26 DIAGNOSIS — J301 Allergic rhinitis due to pollen: Secondary | ICD-10-CM | POA: Diagnosis not present

## 2023-09-02 DIAGNOSIS — J3081 Allergic rhinitis due to animal (cat) (dog) hair and dander: Secondary | ICD-10-CM | POA: Diagnosis not present

## 2023-09-02 DIAGNOSIS — J301 Allergic rhinitis due to pollen: Secondary | ICD-10-CM | POA: Diagnosis not present

## 2023-09-02 DIAGNOSIS — J3089 Other allergic rhinitis: Secondary | ICD-10-CM | POA: Diagnosis not present

## 2023-09-16 DIAGNOSIS — J301 Allergic rhinitis due to pollen: Secondary | ICD-10-CM | POA: Diagnosis not present

## 2023-09-16 DIAGNOSIS — J3081 Allergic rhinitis due to animal (cat) (dog) hair and dander: Secondary | ICD-10-CM | POA: Diagnosis not present

## 2023-09-16 DIAGNOSIS — J3089 Other allergic rhinitis: Secondary | ICD-10-CM | POA: Diagnosis not present

## 2023-09-19 ENCOUNTER — Ambulatory Visit (INDEPENDENT_AMBULATORY_CARE_PROVIDER_SITE_OTHER): Payer: Federal, State, Local not specified - PPO | Admitting: Otolaryngology

## 2023-09-19 ENCOUNTER — Encounter (INDEPENDENT_AMBULATORY_CARE_PROVIDER_SITE_OTHER): Payer: Self-pay | Admitting: Otolaryngology

## 2023-09-19 ENCOUNTER — Other Ambulatory Visit (HOSPITAL_COMMUNITY): Payer: Self-pay | Admitting: *Deleted

## 2023-09-19 VITALS — BP 149/87 | HR 80 | Ht 61.0 in | Wt 176.0 lb

## 2023-09-19 DIAGNOSIS — J342 Deviated nasal septum: Secondary | ICD-10-CM

## 2023-09-19 DIAGNOSIS — R490 Dysphonia: Secondary | ICD-10-CM | POA: Diagnosis not present

## 2023-09-19 DIAGNOSIS — J351 Hypertrophy of tonsils: Secondary | ICD-10-CM

## 2023-09-19 DIAGNOSIS — R1310 Dysphagia, unspecified: Secondary | ICD-10-CM

## 2023-09-19 DIAGNOSIS — K219 Gastro-esophageal reflux disease without esophagitis: Secondary | ICD-10-CM | POA: Diagnosis not present

## 2023-09-19 DIAGNOSIS — J3089 Other allergic rhinitis: Secondary | ICD-10-CM

## 2023-09-19 DIAGNOSIS — J343 Hypertrophy of nasal turbinates: Secondary | ICD-10-CM

## 2023-09-19 DIAGNOSIS — R0981 Nasal congestion: Secondary | ICD-10-CM

## 2023-09-19 MED ORDER — FLUTICASONE PROPIONATE 50 MCG/ACT NA SUSP: 2.0000 | Freq: Two times a day (BID) | NASAL | 6 refills | Status: AC

## 2023-09-19 NOTE — Patient Instructions (Addendum)
-   start Flonase and continue Allegra and allergy shots - schedule swallow study  - add reflux gourmet to Prilosec - return after testing   - Take Reflux Gourmet (natural supplement available on Amazon) to help with symptoms of chronic throat irritation     Lloyd Huger Med Nasal Saline Rinse   - start nasal saline rinses with [employer] Bottle available over the counter or online to help with nasal congestion

## 2023-09-19 NOTE — Progress Notes (Signed)
ENT CONSULT:  Reason for Consult: dysphagia and dysphonia tonsillar hypertrophy  HPI: Maria Goodwin is an 42 y.o. female with hx of environmental allergies currently on allergy shots, history of tonsillar hypertrophy, here to discuss dysphagia dysphonia and symptoms of throat discomfort/tonsil stones.   She is here for intermittent trouble with swallowing with solid foods, where food would not go down quick enough and pill dysphagia x few years. She also tends to lose her voice intermittently. States her voice becomes "low" in pitch and with poor projection. She gets tonsil stones, has been an issue her whole life. She had always had a problem with recurrent bad breath. She takes Prilosec 40 mg for acid reflux and has been on it for 6 weeks, takes it at night before bed. Her acid reflux improved since she started Prilosec. She has allergies with nasal congestion and post-nasal drainage. Her trouble with swallowing is not consistent and not present all the time. She denies shortness of breath. She had allergy testing and is on allergy shots currently (started 6 months). She takes Allegra 24-hr, not on nasal sprays.   Records Reviewed:  [facility] office visit note 07/30/2023 Hypertension/obesity/palpitations:  Pt reports compliance with amlodipine 10 mg daily. Blood pressures ranges at home routinely checked. Patient denies chest pain, shortness of breath, dizziness or lower extremity edema.  Pt is not taking a daily baby ASA. Pt is not prescribed a prescribed statin. RF: Hypertension, obesity, family history of hypertension, hyperlipidemia and stroke   IDA/elevated platelets: Patient has a history of iron deficiency anemia.  She had been taking iron with her menstrual cycles and was found to have elevated iron levels of 153 and an elevated saturation ratio of 50.6 in February 2024.  Her ferritin at that time was 18.4.  Transferrin 216.  She was advised to stop iron supplement due to  elevated saturations and iron levels in her blood, especially since she was only taking a few times a month.  She was retested at her gynecology office for her ferritin in July 2024 and her ferritin had dropped from 18.4-15 in those 5 months.  A full iron panel was not collected at that time.   Swallowing difficulty: Patient reports she has experienced swallowing difficulty for couple years, worsening over the last year.  She reports she does have enlarged tonsils and food will get stuck in her tonsils at times.  She also experiences tonsil stones on occasions.  She states when she does get food caught up in her esophagus it is not stuck or cause choking, it is more of a very slow transition down her esophagus.  This starts towards the back of her throat.  She states when it does occur, it seems to occur more frequently for about a week and then she can go days to weeks without any recurrent symptoms.  She has not noticed any difficulty with swallowing liquids.  She has not noticed any particular type of food that is more prone to cause symptoms.  She states sometimes she will cough for have to regurgitate the food.  She so had noticed voice changes during this time she will become hoarse.  She does endorse having reflux symptoms and will take Tums during that time.  She also has chronic allergies in which she is being treated through asthma and allergy.  Pharyngoesophageal dysphagia We discussed the different causes of dysphagia. Since she does have some reflux symptoms, we discussed a reflux diet. She was encouraged to avoid  laying down after eating by at least 3 hours. Start omeprazole 40 mg nightly ENT referral placed for her today for further evaluation.  We also discussed considering GI referral if needed at a later date.   Past Medical History:  Diagnosis Date   Allergic reaction 02/23/2020   Itchy mouth with watermelon and cucumber.  Refer to allergist.   Allergic rhinitis    Allergy     Anemia 2021   Anxiety Since childhood   Blood in stool 2010   Patient reports hospitalization for blood in stool, diarrhea and extreme dehydration.  She was hospitalized at this time and a colonoscopy was completed.   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   Frequent headaches    GERD (gastroesophageal reflux disease)    Heart murmur    Hypertension    Iron deficiency anemia    Irregular menstrual bleeding 2015   Numbness and tingling of upper extremity 02/23/2020   Osteoarthritis of knee 2016   Other allergic rhinitis 03/23/2020   Patellofemoral pain syndrome 04/2015   Tinea corporis    Tonsil stone 2016   Vitamin D deficiency     Past Surgical History:  Procedure Laterality Date   BREAST BIOPSY Left 08/2016   fibroadenoma   BREAST BIOPSY Left 01/15/2021   BREAST BIOPSY Left 2023   CESAREAN SECTION  2012   WISDOM TOOTH EXTRACTION      Family History  Problem Relation Age of Onset   Arthritis Mother    Hyperlipidemia Mother    Hypertension Mother    Learning disabilities Mother    Allergic rhinitis Mother    ALS Mother    Alcohol abuse Father    Depression Father    Vision loss Father    Asthma Brother    Hyperlipidemia Brother    Hypertension Brother    Allergic rhinitis Brother    Asthma Brother    Hyperlipidemia Brother    Hypertension Brother    Allergic rhinitis Brother    Bladder Cancer Maternal Grandmother    Early death Maternal Grandmother    Skin cancer Maternal Grandfather    Early death Maternal Grandfather    Stroke Maternal Grandfather    Hypertension Maternal Grandfather    Skin cancer Paternal Grandmother    Skin cancer Paternal Grandfather    Allergic rhinitis Son    Eczema Son    Urticaria Son    Breast cancer Neg Hx     Social History:  reports that she has never smoked. She has never used smokeless tobacco. She reports that she does not currently use alcohol. She reports that she does not use drugs.  Allergies: No Known  Allergies  Medications: I have reviewed the patient's current medications.  The PMH, PSH, Medications, Allergies, and SH were reviewed and updated.  ROS: Constitutional: Negative for fever, weight loss and weight gain. Cardiovascular: Negative for chest pain and dyspnea on exertion. Respiratory: Is not experiencing shortness of breath at rest. Gastrointestinal: Negative for nausea and vomiting. Neurological: Negative for headaches. Psychiatric: The patient is not nervous/anxious  Blood pressure (!) 149/87, pulse 80, height 5\' 1"  (1.549 m), weight 176 lb (79.8 kg), SpO2 98%.  PHYSICAL EXAM:  Exam: General: Well-developed, well-nourished Communication and Voice: Clear overall, frequent throat clearing on exam Respiratory Respiratory effort: Equal inspiration and expiration without stridor Cardiovascular Peripheral Vascular: Warm extremities with equal color/perfusion Eyes: No nystagmus with equal extraocular motion bilaterally Neuro/Psych/Balance: Patient oriented to person, place, and time; Appropriate mood  and affect; Gait is intact with no imbalance; Cranial nerves I-XII are intact Head and Face Inspection: Normocephalic and atraumatic without mass or lesion Palpation: Facial skeleton intact without bony stepoffs Salivary Glands: No mass or tenderness Facial Strength: Facial motility symmetric and full bilaterally ENT Pinna: External ear intact and fully developed External canal: Canal is patent with intact skin Tympanic Membrane: Clear and mobile External Nose: No scar or anatomic deformity Internal Nose: Septum is deviated to the left. No polyp, or purulence. Significant mucosal edema and erythema present.  Bilateral inferior turbinate hypertrophy.  Lips, Teeth, and gums: Mucosa and teeth intact and viable TMJ: No pain to palpation with full mobility Oral cavity/oropharynx: No erythema or exudate, no lesions present, tonsils 1-2 + cryptic, no tonsil stones noted today   Nasopharynx: No mass or lesion with intact mucosa Hypopharynx: Intact mucosa without pooling of secretions Larynx Glottic: Full true vocal cord mobility without lesion or mass Supraglottic: Normal appearing epiglottis and AE folds Interarytenoid Space: Moderate pachydermia edema Subglottic Space: limited exam but appears patent  Neck Neck and Trachea: Midline trachea without mass or lesion Thyroid: No mass or nodularity Lymphatics: No lymphadenopathy  Procedure: Preoperative diagnosis: Dysphagia and dysphonia   Postoperative diagnosis:   Same + GERD LPR  Procedure: Flexible fiberoptic laryngoscopy  Surgeon: Ashok Croon, MD  Anesthesia: Topical lidocaine and Afrin Complications: None Condition is stable throughout exam  Indications and consent:  The patient presents to the clinic with Indirect laryngoscopy view was incomplete. Thus it was recommended that they undergo a flexible fiberoptic laryngoscopy. All of the risks, benefits, and potential complications were reviewed with the patient preoperatively and verbal informed consent was obtained.  Procedure: The patient was seated upright in the clinic. Topical lidocaine and Afrin were applied to the nasal cavity. After adequate anesthesia had occurred, I then proceeded to pass the flexible telescope into the nasal cavity. The nasal cavity was patent without rhinorrhea or polyp. The nasopharynx was also patent without mass or lesion. The base of tongue was visualized and was normal. There were no signs of pooling of secretions in the piriform sinuses. The true vocal folds were mobile bilaterally. There were no signs of glottic or supraglottic mucosal lesion or mass. There was moderate interarytenoid pachydermia and post cricoid edema. The telescope was then slowly withdrawn and the patient tolerated the procedure throughout.      Studies Reviewed: Allergy test 03/23/2020   Assessment/Plan: Encounter Diagnoses  Name Primary?    Dysphagia, unspecified type    Dysphonia Yes   Chronic GERD    Environmental and seasonal allergies    Tonsillar hypertrophy    Hypertrophy of both inferior nasal turbinates    Nasal septal deviation    Chronic nasal congestion     Dysphagia, mild intermittent, present for several years, sensation of food getting stuck in the back of her throat and not going down as quickly as it should, also reports pill dysphagia with large pills.  History of GERD LPR, symptoms of heartburn and trouble with swallowing are somewhat better after 6 weeks on Prilosec -Continue Prilosec 40 mg daily -We discussed diet and lifestyle changes to minimize reflux -Trial of reflux Gourmet -Will order MBS esophagram to better evaluate swallow function  2.  Tonsillar hypertrophy, tonsil stones, bad breath -2+ tonsils on exam without tonsil stones or exudate, cryptic -We discussed the effect of postnasal drainage and GERD LPR on frequent sore throat and throat discomfort -Will try conservative management of GERD LPR and postnasal  drainage first -Gargle with salt water -Will consider tonsillectomy in the future if indicated  3.  Environmental allergies, on allergy shots -Evidence of significant mucosal edema and postnasal drainage on scope exam today -Resume Flonase 2 puffs bilateral nares twice daily -Nasal saline rinses if tolerating -Continue Allegra daily -Continue allergy shots  4.  Septal deviation and inferior turbinate hypertrophy on nasal endoscopy today -Medical management of allergies as above -Will consider septo ITR in the future if indicated  5.  Intermittent dysphonia with raspy voice and decrease in projection for several months -Vocal folds appear healthy on flexible scope exam today, suspect symptoms are due to chronic irritation from GERD LPR and postnasal drainage -Medical management of GERD LPR and allergies as above -Videostrobe in the future if symptoms persist  Thank you for allowing  me to participate in the care of this patient. Please do not hesitate to contact me with any questions or concerns.   Ashok Croon, MD Otolaryngology [facility] Phone: [phone] Fax: [phone]    09/19/2023, 9:23 AM

## 2023-09-25 DIAGNOSIS — J301 Allergic rhinitis due to pollen: Secondary | ICD-10-CM | POA: Diagnosis not present

## 2023-09-25 DIAGNOSIS — J3089 Other allergic rhinitis: Secondary | ICD-10-CM | POA: Diagnosis not present

## 2023-10-20 DIAGNOSIS — J3089 Other allergic rhinitis: Secondary | ICD-10-CM | POA: Diagnosis not present

## 2023-10-20 DIAGNOSIS — J301 Allergic rhinitis due to pollen: Secondary | ICD-10-CM | POA: Diagnosis not present

## 2023-10-20 DIAGNOSIS — J3081 Allergic rhinitis due to animal (cat) (dog) hair and dander: Secondary | ICD-10-CM | POA: Diagnosis not present

## 2023-10-22 ENCOUNTER — Ambulatory Visit (HOSPITAL_COMMUNITY)
Admission: RE | Admit: 2023-10-22 | Discharge: 2023-10-22 | Disposition: A | Discharge status: Home / Self Care | Payer: Federal, State, Local not specified - PPO | Source: Ambulatory Visit | Attending: Otolaryngology | Admitting: Otolaryngology

## 2023-10-22 ENCOUNTER — Ambulatory Visit (HOSPITAL_COMMUNITY)
Admission: RE | Admit: 2023-10-22 | Discharge: 2023-10-22 | Disposition: A | Discharge status: Home / Self Care | Payer: Federal, State, Local not specified - PPO | Source: Ambulatory Visit | Attending: Family Medicine | Admitting: Family Medicine

## 2023-10-22 DIAGNOSIS — K219 Gastro-esophageal reflux disease without esophagitis: Secondary | ICD-10-CM | POA: Insufficient documentation

## 2023-10-22 DIAGNOSIS — K449 Diaphragmatic hernia without obstruction or gangrene: Secondary | ICD-10-CM | POA: Insufficient documentation

## 2023-10-22 DIAGNOSIS — R1310 Dysphagia, unspecified: Secondary | ICD-10-CM

## 2023-10-22 DIAGNOSIS — R1314 Dysphagia, pharyngoesophageal phase: Secondary | ICD-10-CM | POA: Insufficient documentation

## 2023-10-22 DIAGNOSIS — R1312 Dysphagia, oropharyngeal phase: Secondary | ICD-10-CM | POA: Insufficient documentation

## 2023-10-22 NOTE — Progress Notes (Signed)
Modified Barium Swallow Study  Patient Details  Name: Kumiko Sautter MRN: [id number] Date of Birth: [birth year]  Today's Date: 10/22/2023  Modified Barium Swallow completed.  Full report located under Chart Review in the Imaging Section.  History of Present Illness Pt referred by ENT for MBS due to complaint of intermittent trouble with swallowing with solid foods, where food would not go down quick enough and pill dysphagia x few years. She also tends to lose her voice intermittently. States her voice becomes "low" in pitch and with poor projection. She takes Prilosec 40 mg for acid reflux and has been on it for 6 weeks, takes it at night before bed. Her acid reflux improved since she started Prilosec.   Clinical Impression Pt presents with a mild oropharyngeal dysphagia with vallecualr residue and decreased PES opening. Pt has a small curled epiglottis that does nto fully invert at the height of the swallow despite adequate hyoid excursion. There is entrapment of thin liquids and mild solids residue. A second swallow or an effortful swallow are most beneficial. 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 There is trace residue below the PES. These finding could partially account for pt sensation of slow transit or residue. Used videofeedback to explain rationale for effortful swallow or second swallow.  Warned pt to be attentive to signs of worsened dyspahgia in her future given early anatomical changes on cervical spine impacting swallow function at a young age. Also explained that throat clearing is likely a secondary behavior due to chronic reflux and is not needed or effectual. Suggested f/u with OP SLP for voice therapy. Factors that may increase risk of adverse event in presence of aspiration Rubye Oaks & Clearance Coots 2021):    Swallow Evaluation Recommendations Recommendations: PO diet PO Diet Recommendation: Regular;Thin liquids  (Level 0) Liquid Administration via: Cup;Straw Medication Administration: Whole meds with puree Supervision: Patient able to self-feed Swallowing strategies  : effortful swallow;Multiple dry swallows after each bite/sip Oral care recommendations: Pt independent with oral care      Shalise Rosado, Riley Nearing 10/22/2023,1:15 PM

## 2023-10-27 DIAGNOSIS — J3081 Allergic rhinitis due to animal (cat) (dog) hair and dander: Secondary | ICD-10-CM | POA: Diagnosis not present

## 2023-10-27 DIAGNOSIS — J3089 Other allergic rhinitis: Secondary | ICD-10-CM | POA: Diagnosis not present

## 2023-10-27 DIAGNOSIS — J301 Allergic rhinitis due to pollen: Secondary | ICD-10-CM | POA: Diagnosis not present

## 2023-11-06 DIAGNOSIS — J3089 Other allergic rhinitis: Secondary | ICD-10-CM | POA: Diagnosis not present

## 2023-11-06 DIAGNOSIS — J301 Allergic rhinitis due to pollen: Secondary | ICD-10-CM | POA: Diagnosis not present

## 2023-11-06 DIAGNOSIS — J3081 Allergic rhinitis due to animal (cat) (dog) hair and dander: Secondary | ICD-10-CM | POA: Diagnosis not present

## 2023-11-14 DIAGNOSIS — J3089 Other allergic rhinitis: Secondary | ICD-10-CM | POA: Diagnosis not present

## 2023-11-14 DIAGNOSIS — J301 Allergic rhinitis due to pollen: Secondary | ICD-10-CM | POA: Diagnosis not present

## 2023-11-14 DIAGNOSIS — J3081 Allergic rhinitis due to animal (cat) (dog) hair and dander: Secondary | ICD-10-CM | POA: Diagnosis not present

## 2023-11-28 DIAGNOSIS — J301 Allergic rhinitis due to pollen: Secondary | ICD-10-CM | POA: Diagnosis not present

## 2023-11-28 DIAGNOSIS — J3089 Other allergic rhinitis: Secondary | ICD-10-CM | POA: Diagnosis not present

## 2023-12-03 DIAGNOSIS — J3089 Other allergic rhinitis: Secondary | ICD-10-CM | POA: Diagnosis not present

## 2023-12-03 DIAGNOSIS — R059 Cough, unspecified: Secondary | ICD-10-CM | POA: Diagnosis not present

## 2023-12-03 DIAGNOSIS — J301 Allergic rhinitis due to pollen: Secondary | ICD-10-CM | POA: Diagnosis not present

## 2023-12-03 DIAGNOSIS — T781XXA Other adverse food reactions, not elsewhere classified, initial encounter: Secondary | ICD-10-CM | POA: Diagnosis not present

## 2023-12-05 ENCOUNTER — Encounter: Payer: Self-pay | Admitting: Family Medicine

## 2023-12-09 NOTE — Telephone Encounter (Signed)
Please inform patient we would need to have a encounter in order to order an imaging study of any type.  If she desires, we can complete that over a virtual visit so she does not have to come all the way into the office.

## 2023-12-10 DIAGNOSIS — J3081 Allergic rhinitis due to animal (cat) (dog) hair and dander: Secondary | ICD-10-CM | POA: Diagnosis not present

## 2023-12-10 DIAGNOSIS — J301 Allergic rhinitis due to pollen: Secondary | ICD-10-CM | POA: Diagnosis not present

## 2023-12-10 DIAGNOSIS — J3089 Other allergic rhinitis: Secondary | ICD-10-CM | POA: Diagnosis not present

## 2023-12-12 DIAGNOSIS — J301 Allergic rhinitis due to pollen: Secondary | ICD-10-CM | POA: Diagnosis not present

## 2023-12-12 DIAGNOSIS — J3089 Other allergic rhinitis: Secondary | ICD-10-CM | POA: Diagnosis not present

## 2023-12-19 DIAGNOSIS — J301 Allergic rhinitis due to pollen: Secondary | ICD-10-CM | POA: Diagnosis not present

## 2023-12-19 DIAGNOSIS — J3089 Other allergic rhinitis: Secondary | ICD-10-CM | POA: Diagnosis not present

## 2023-12-19 DIAGNOSIS — J3081 Allergic rhinitis due to animal (cat) (dog) hair and dander: Secondary | ICD-10-CM | POA: Diagnosis not present

## 2023-12-22 ENCOUNTER — Encounter (INDEPENDENT_AMBULATORY_CARE_PROVIDER_SITE_OTHER): Payer: Self-pay | Admitting: Otolaryngology

## 2023-12-22 ENCOUNTER — Ambulatory Visit (INDEPENDENT_AMBULATORY_CARE_PROVIDER_SITE_OTHER): Payer: Federal, State, Local not specified - PPO | Admitting: Otolaryngology

## 2023-12-22 VITALS — BP 146/82 | HR 80

## 2023-12-22 DIAGNOSIS — M2578 Osteophyte, vertebrae: Secondary | ICD-10-CM

## 2023-12-22 DIAGNOSIS — J309 Allergic rhinitis, unspecified: Secondary | ICD-10-CM

## 2023-12-22 DIAGNOSIS — R1310 Dysphagia, unspecified: Secondary | ICD-10-CM

## 2023-12-22 DIAGNOSIS — R490 Dysphonia: Secondary | ICD-10-CM

## 2023-12-22 DIAGNOSIS — K219 Gastro-esophageal reflux disease without esophagitis: Secondary | ICD-10-CM

## 2023-12-22 DIAGNOSIS — R0981 Nasal congestion: Secondary | ICD-10-CM

## 2023-12-22 NOTE — Progress Notes (Unsigned)
ENT Progress Note  Update 12/23/23: Discussed the use of AI scribe software for clinical note transcription with the patient, who gave verbal consent to proceed.  History of Present Illness   The patient presents for f/u after swallow study.  The patient experiences swallowing difficulties characterized by food getting stuck, which has been ongoing for some time. 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 Esophagram showed GERD and a tiny hiatal hernia.  Chronic reflux symptoms have improved since starting Prilosec, taken daily. Reflux symptoms have significantly decreased compared to previous visits, but they still experience food getting stuck at times. They use a nasal spray and Zyrtec for allergies and are receiving allergy shots.  Their previously raspy voice has improved and is not as severe as in the past.  They have a history of neck pain and tightness, which has been present for a long time. They have not undergone an upper endoscopy in the past.    Records Reviewed Initial Evaluation 09/19/23 Reason for Consult: dysphagia and dysphonia tonsillar hypertrophy  HPI: Maria Goodwin is an 43 y.o. female with hx of environmental allergies currently on allergy shots, history of tonsillar hypertrophy, here to discuss dysphagia dysphonia and symptoms of throat discomfort/tonsil stones.   She is here for intermittent trouble with swallowing with solid foods, where food would not go down quick enough and pill dysphagia x few years. She also tends to lose her voice intermittently. States her voice becomes "low" in pitch and with poor projection. She gets tonsil stones, has been an issue her whole life. She had always had a problem with recurrent bad breath. She takes Prilosec 40 mg for acid reflux and has been on it for 6 weeks, takes it at night before bed. Her acid reflux improved since she started Prilosec. She  has allergies with nasal congestion and post-nasal drainage. Her trouble with swallowing is not consistent and not present all the time. She denies shortness of breath. She had allergy testing and is on allergy shots currently (started 6 months). She takes Allegra 24-hr, not on nasal sprays.    Maria Goodwin primary care office visit note 07/30/2023 Hypertension/obesity/palpitations:  Pt reports compliance with amlodipine 10 mg daily. Blood pressures ranges at home routinely checked. Patient denies chest pain, shortness of breath, dizziness or lower extremity edema.  Pt is not taking a daily baby ASA. Pt is not prescribed a prescribed statin. RF: Hypertension, obesity, family history of hypertension, hyperlipidemia and stroke   IDA/elevated platelets: Patient has a history of iron deficiency anemia.  She had been taking iron with her menstrual cycles and was found to have elevated iron levels of 153 and an elevated saturation ratio of 50.6 in February 2024.  Her ferritin at that time was 18.4.  Transferrin 216.  She was advised to stop iron supplement due to elevated saturations and iron levels in her blood, especially since she was only taking a few times a month.  She was retested at her gynecology office for her ferritin in July 2024 and her ferritin had dropped from 18.4-15 in those 5 months.  A full iron panel was not collected at that time.   Swallowing difficulty: Patient reports she has experienced swallowing difficulty for couple years, worsening over the last year.  She reports she does have enlarged tonsils and food will get stuck in her tonsils at times.  She also experiences tonsil stones on occasions.  She states when  she does get food caught up in her esophagus it is not stuck or cause choking, it is more of a very slow transition down her esophagus.  This starts towards the back of her throat.  She states when it does occur, it seems to occur more frequently for about a week and then she can go  days to weeks without any recurrent symptoms.  She has not noticed any difficulty with swallowing liquids.  She has not noticed any particular type of food that is more prone to cause symptoms.  She states sometimes she will cough for have to regurgitate the food.  She so had noticed voice changes during this time she will become hoarse.  She does endorse having reflux symptoms and will take Tums during that time.  She also has chronic allergies in which she is being treated through asthma and allergy.  Pharyngoesophageal dysphagia We discussed the different causes of dysphagia. Since she does have some reflux symptoms, we discussed a reflux diet. She was encouraged to avoid laying down after eating by at least 3 hours. Start omeprazole 40 mg nightly ENT referral placed for her today for further evaluation.  We also discussed considering GI referral if needed at a later date.   Past Medical History:  Diagnosis Date   Allergic reaction 02/23/2020   Itchy mouth with watermelon and cucumber.  Refer to allergist.   Allergic rhinitis    Allergy    Anemia 2021   Anxiety Since childhood   Blood in stool 2010   Patient reports hospitalization for blood in stool, diarrhea and extreme dehydration.  She was hospitalized at this time and a colonoscopy was completed.   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   Frequent headaches    GERD (gastroesophageal reflux disease)    Heart murmur    Hypertension    Iron deficiency anemia    Irregular menstrual bleeding 2015   Numbness and tingling of upper extremity 02/23/2020   Osteoarthritis of knee 2016   Other allergic rhinitis 03/23/2020   Patellofemoral pain syndrome 04/2015   Tinea corporis    Tonsil stone 2016   Vitamin D deficiency     Past Surgical History:  Procedure Laterality Date   BREAST BIOPSY Left 08/2016   fibroadenoma   BREAST BIOPSY Left 01/15/2021   BREAST BIOPSY Left 2023   CESAREAN SECTION  2012   WISDOM TOOTH  EXTRACTION      Family History  Problem Relation Age of Onset   Arthritis Mother    Hyperlipidemia Mother    Hypertension Mother    Learning disabilities Mother    Allergic rhinitis Mother    ALS Mother    Alcohol abuse Father    Depression Father    Vision loss Father    Asthma Brother    Hyperlipidemia Brother    Hypertension Brother    Allergic rhinitis Brother    Asthma Brother    Hyperlipidemia Brother    Hypertension Brother    Allergic rhinitis Brother    Bladder Cancer Maternal Grandmother    Early death Maternal Grandmother    Skin cancer Maternal Grandfather    Early death Maternal Grandfather    Stroke Maternal Grandfather    Hypertension Maternal Grandfather    Skin cancer Paternal Grandmother    Skin cancer Paternal Grandfather    Allergic rhinitis Son    Eczema Son    Urticaria Son    Breast cancer Neg Hx  Social History:  reports that she has never smoked. She has never used smokeless tobacco. She reports that she does not currently use alcohol. She reports that she does not use drugs.  Allergies: No Known Allergies  Medications: I have reviewed the patient's current medications.  The PMH, PSH, Medications, Allergies, and SH were reviewed and updated.  ROS: Constitutional: Negative for fever, weight loss and weight gain. Cardiovascular: Negative for chest pain and dyspnea on exertion. Respiratory: Is not experiencing shortness of breath at rest. Gastrointestinal: Negative for nausea and vomiting. Neurological: Negative for headaches. Psychiatric: The patient is not nervous/anxious  Blood pressure (!) 146/82, pulse 80, SpO2 100%.  PHYSICAL EXAM:  Exam: General: Well-developed, well-nourished Communication and Voice: Clear overall, frequent throat clearing on exam Respiratory Respiratory effort: Equal inspiration and expiration without stridor Cardiovascular Peripheral Vascular: Warm extremities with equal color/perfusion Eyes: No  nystagmus with equal extraocular motion bilaterally Neuro/Psych/Balance: Patient oriented to person, place, and time; Appropriate mood and affect; Gait is intact with no imbalance; Cranial nerves I-XII are intact Head and Face Inspection: Normocephalic and atraumatic without mass or lesion Facial Strength: Facial motility symmetric and full bilaterally ENT Pinna: External ear intact and fully developed External canal: Canal is patent with intact skin Tympanic Membrane: Clear and mobile External Nose: No scar or anatomic deformity Internal Nose: Septum is deviated to the left. Bilateral inferior turbinate hypertrophy.  Lips, Teeth, and gums: Mucosa and teeth intact and viable TMJ: No pain to palpation with full mobility Oral cavity/oropharynx: No erythema or exudate, no lesions present, tonsils 1-2 + cryptic, no tonsil stones noted today   Studies Reviewed: Allergy test 03/23/2020   MBS 10/22/23 HPI: Pt referred by ENT for MBS due to complaint of intermittent trouble with swallowing with solid foods, where food would not go down quick enough and pill dysphagia x few years. She also tends to lose her voice intermittently. States her voice becomes "low" in pitch and with poor projection. She takes Prilosec 40 mg for acid reflux and has been on it for 6 weeks, takes it at night before bed. Her acid reflux improved since she started Prilosec.     Clinical Impression: Clinical Impression: Pt presents with a mild oropharyngeal dysphagia with vallecualr residue and decreased PES opening. Pt has a small curled epiglottis that does not fully invert at the height of the swallow despite adequate hyoid excursion. There is entrapment of thin liquids and mild solids residue. A second swallow or an effortful swallow are most beneficial. 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 There is trace residue below the PES. These finding could  partially account for pt sensation of slow transit or residue. Used videofeedback to explain rationale for effortful swallow or second swallow.  Warned pt to be attentive to signs of worsened dyspahgia in her future given early anatomical changes on cervical spine impacting swallow function at a young age. Also explained that throat clearing is likely a secondary behavior due to chronic reflux and is not needed or effectual. Suggested f/u with OP SLP for voice therapy.  Esophagram 10/22/23 MPRESSION: 1.  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   2. Spontaneous gastroesophageal reflux to the level of the lower esophagus.   3.  Tiny hiatal hernia.  Assessment/Plan: No diagnosis found.   Dysphagia, mild intermittent, present for several years, sensation of food getting stuck in the back of her throat and not going down  as quickly as it should, also reports pill dysphagia with large pills.  History of GERD LPR, symptoms of heartburn and trouble with swallowing are somewhat better after 6 weeks on Prilosec  -Continue Prilosec 40 mg daily -We discussed diet and lifestyle changes to minimize reflux -Trial of reflux Gourmet -Will order MBS esophagram to better evaluate swallow function  2.  Tonsillar hypertrophy, tonsil stones, bad breath -2+ tonsils on exam without tonsil stones or exudate, cryptic -We discussed the effect of postnasal drainage and GERD LPR on frequent sore throat and throat discomfort -Will try conservative management of GERD LPR and postnasal drainage first -Gargle with salt water -Will consider tonsillectomy in the future if indicated  3.  Environmental allergies, on allergy shots -Evidence of significant mucosal edema and postnasal drainage on scope exam today -Resume Flonase 2 puffs bilateral nares twice daily -Nasal saline rinses if tolerating -Continue Allegra daily -Continue allergy shots  4.  Septal deviation and inferior turbinate  hypertrophy on nasal endoscopy today -Medical management of allergies as above -Will consider septo ITR in the future if indicated  5.  Intermittent dysphonia with raspy voice and decrease in projection for several months -Vocal folds appear healthy on flexible scope exam today, suspect symptoms are due to chronic irritation from GERD LPR and postnasal drainage -Medical management of GERD LPR and allergies as above -Videostrobe in the future if symptoms persist  Update 12/22/23 Assessment and Plan    Dysphagia Symptoms of food getting stuck mostly solids and pills. MBS/esophagram with reduced upper esophageal sphincter (UES) opening could be due to CP dysfunction, and cervical spine osteophyte. Esophagram showed GERD. Speech therapy noted spine osteophyte and incomplete UES opening causing residue during swallow study, dx of mild oropharyngeal dysphagia. Surgical intervention for osteophyte not recommended in most cases. Tolerating regular diet and improved reflux symptoms with Prilosec. Discussed chronic reflux leading to UES tightening and potential benefits of upper endoscopy for eosinophilic esophagitis workup and UES dilation. 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 Degenerative change not typically surgically addressed unless severe symptoms like arm weakness or pain. Surgery rarely performed due to significant risks and comorbidities, especially in younger patients. - Refer to speech therapy for swallowing therapy  Gastroesophageal Reflux Disease (GERD) LPR Reflux improved with Prilosec. Esophagram showed significant reflux. Reports reduced acid reflux and improved raspy voice since  starting Prilosec. Discussed chronic reflux leading to UES tightening and swallowing issues. - Continue Prilosec 40 mg daily - Recommend trial of Reflux Gourmet  Allergic Rhinitis/chronic nasal congestion and environmental allergies  Using nasal spray and Zyrtec. Positive allergy testing and receiving allergy shots. Discussed that allergy shots might reduce the need for medication and nasal spray over time. - Continue Flonase 2 puffs b/l nares BID and Zyrtec 10 mg daily - Continue allergy shots  Follow-up - Follow up as needed if symptoms worsen.        Ashok Croon, MD Otolaryngology [facility] Phone: [phone] Fax: [phone]    12/22/2023, 8:46 AM

## 2023-12-26 DIAGNOSIS — J3089 Other allergic rhinitis: Secondary | ICD-10-CM | POA: Diagnosis not present

## 2023-12-26 DIAGNOSIS — J301 Allergic rhinitis due to pollen: Secondary | ICD-10-CM | POA: Diagnosis not present

## 2023-12-26 DIAGNOSIS — J3081 Allergic rhinitis due to animal (cat) (dog) hair and dander: Secondary | ICD-10-CM | POA: Diagnosis not present

## 2023-12-31 ENCOUNTER — Ambulatory Visit: Payer: Federal, State, Local not specified - PPO | Admitting: Speech Pathology

## 2024-01-01 DIAGNOSIS — J301 Allergic rhinitis due to pollen: Secondary | ICD-10-CM | POA: Diagnosis not present

## 2024-01-01 DIAGNOSIS — J3089 Other allergic rhinitis: Secondary | ICD-10-CM | POA: Diagnosis not present

## 2024-01-01 DIAGNOSIS — J3081 Allergic rhinitis due to animal (cat) (dog) hair and dander: Secondary | ICD-10-CM | POA: Diagnosis not present

## 2024-01-09 DIAGNOSIS — J301 Allergic rhinitis due to pollen: Secondary | ICD-10-CM | POA: Diagnosis not present

## 2024-01-09 DIAGNOSIS — J3089 Other allergic rhinitis: Secondary | ICD-10-CM | POA: Diagnosis not present

## 2024-01-12 ENCOUNTER — Encounter: Payer: Federal, State, Local not specified - PPO | Admitting: Speech Pathology

## 2024-01-14 ENCOUNTER — Encounter: Payer: Self-pay | Admitting: Family Medicine

## 2024-01-14 ENCOUNTER — Ambulatory Visit: Payer: Federal, State, Local not specified - PPO | Admitting: Family Medicine

## 2024-01-14 ENCOUNTER — Ambulatory Visit
Admission: RE | Admit: 2024-01-14 | Discharge: 2024-01-14 | Disposition: A | Discharge status: Home / Self Care | Payer: Federal, State, Local not specified - PPO | Source: Ambulatory Visit | Attending: Family Medicine | Admitting: Family Medicine

## 2024-01-14 VITALS — BP 132/78 | HR 70 | Temp 98.3°F | Ht 62.0 in | Wt 171.6 lb

## 2024-01-14 DIAGNOSIS — E559 Vitamin D deficiency, unspecified: Secondary | ICD-10-CM

## 2024-01-14 DIAGNOSIS — Z0000 Encounter for general adult medical examination without abnormal findings: Secondary | ICD-10-CM

## 2024-01-14 DIAGNOSIS — M542 Cervicalgia: Secondary | ICD-10-CM

## 2024-01-14 DIAGNOSIS — Z1239 Encounter for other screening for malignant neoplasm of breast: Secondary | ICD-10-CM

## 2024-01-14 DIAGNOSIS — I10 Essential (primary) hypertension: Secondary | ICD-10-CM | POA: Diagnosis not present

## 2024-01-14 DIAGNOSIS — K219 Gastro-esophageal reflux disease without esophagitis: Secondary | ICD-10-CM

## 2024-01-14 DIAGNOSIS — M5412 Radiculopathy, cervical region: Secondary | ICD-10-CM | POA: Diagnosis not present

## 2024-01-14 DIAGNOSIS — Z131 Encounter for screening for diabetes mellitus: Secondary | ICD-10-CM | POA: Diagnosis not present

## 2024-01-14 DIAGNOSIS — Z1231 Encounter for screening mammogram for malignant neoplasm of breast: Secondary | ICD-10-CM

## 2024-01-14 DIAGNOSIS — R1319 Other dysphagia: Secondary | ICD-10-CM

## 2024-01-14 DIAGNOSIS — E669 Obesity, unspecified: Secondary | ICD-10-CM

## 2024-01-14 DIAGNOSIS — Z1211 Encounter for screening for malignant neoplasm of colon: Secondary | ICD-10-CM

## 2024-01-14 DIAGNOSIS — R9230 Dense breasts, unspecified: Secondary | ICD-10-CM

## 2024-01-14 HISTORY — DX: Encounter for other screening for malignant neoplasm of breast: Z12.39

## 2024-01-14 LAB — COMPREHENSIVE METABOLIC PANEL
ALT: 20 U/L (ref 0–35)
AST: 19 U/L (ref 0–37)
Albumin: 4.2 g/dL (ref 3.5–5.2)
Alkaline Phosphatase: 73 U/L (ref 39–117)
BUN: 8 mg/dL (ref 6–23)
CO2: 28 meq/L (ref 19–32)
Calcium: 9 mg/dL (ref 8.4–10.5)
Chloride: 101 meq/L (ref 96–112)
Creatinine, Ser: 0.71 mg/dL (ref 0.40–1.20)
GFR: 103.17 mL/min (ref >60.00)
Glucose, Bld: 84 mg/dL (ref 70–99)
Potassium: 4.3 meq/L (ref 3.5–5.1)
Sodium: 136 meq/L (ref 135–145)
Total Bilirubin: 0.4 mg/dL (ref 0.2–1.2)
Total Protein: 7.1 g/dL (ref 6.0–8.3)

## 2024-01-14 LAB — LIPID PANEL
Cholesterol: 182 mg/dL (ref 0–200)
HDL: 58.2 mg/dL (ref >39.00)
LDL Cholesterol: 97 mg/dL (ref 0–99)
NonHDL: 123.52
Total CHOL/HDL Ratio: 3
Triglycerides: 131 mg/dL (ref 0.0–149.0)
VLDL: 26.2 mg/dL (ref 0.0–40.0)

## 2024-01-14 LAB — TSH: TSH: 1.07 u[IU]/mL (ref 0.35–5.50)

## 2024-01-14 LAB — CBC
HCT: 37.1 % (ref 36.0–46.0)
Hemoglobin: 12.3 g/dL (ref 12.0–15.0)
MCHC: 33.1 g/dL (ref 30.0–36.0)
MCV: 89.3 fL (ref 78.0–100.0)
Platelets: 497 10*3/uL — ABNORMAL HIGH (ref 150.0–400.0)
RBC: 4.16 Mil/uL (ref 3.87–5.11)
RDW: 13.4 % (ref 11.5–15.5)
WBC: 9.2 10*3/uL (ref 4.0–10.5)

## 2024-01-14 LAB — HEMOGLOBIN A1C: Hgb A1c MFr Bld: 5.5 % (ref 4.6–6.5)

## 2024-01-14 MED ORDER — AMLODIPINE BESYLATE 10 MG PO TABS: 10.0000 mg | ORAL_TABLET | Freq: Every day | ORAL | 1 refills | Status: DC

## 2024-01-14 MED ORDER — OMEPRAZOLE 40 MG PO CPDR: 40.0000 mg | DELAYED_RELEASE_CAPSULE | Freq: Every day | ORAL | 1 refills | Status: DC

## 2024-01-14 NOTE — Patient Instructions (Addendum)

## 2024-01-14 NOTE — Progress Notes (Signed)
 Patient ID: Maria Goodwin, female  DOB: Oct 15, 1981, 43 y.o.   MRN: [id number] Patient Care Team    Relationship Specialty Notifications Start End  Natalia Leatherwood, DO PCP - General Family Medicine  02/22/20   Tanda Rockers, NP Nurse Practitioner Obstetrics and Gynecology  06/04/23   Eileen Stanford, MD Referring Physician Allergy and Immunology  12/11/23     Chief Complaint  Patient presents with   Annual Exam    Pt is fasting.    Hypertension    Subjective: Maria Goodwin is a 43 y.o.  Female  present for cpe and Chronic Conditions/illness Management All past medical history, surgical history, allergies, family history, immunizations, medications and social history were updated in the electronic medical record today. All recent labs, ED visits and hospitalizations within the last year were reviewed.  Health maintenance:  Colon cancer screen: no fhx, routine screen at 43 > cologuard ordered to complete after her BDAY Mammogram: completed:03/05/2023 >[facility]>ordered> also desires MRI breast. Cervical cancer screening: last pap: 06/04/2023> OB/gyn wnl-hpv- 5 yr Immunizations: tdap UTD 2017, Influenza UTD 2024(encouraged yearly) Infectious disease screening: HIV completed, Hep C completed DEXA: routine screen   Hypertension/obesity/palpitations:  Pt reports compliance with amlodipine 10 mg daily.  Patient denies chest pain, shortness of breath, dizziness or lower extremity edema.   Pt is not taking a daily baby ASA. Pt is not prescribed a prescribed statin. RF: Hypertension, obesity, family history of hypertension, hyperlipidemia and stroke  IDA/elevated platelets: Patient has a history of iron deficiency anemia.   She had been taking iron with her menstrual cycles and was found to have elevated iron levels of 153 and an elevated saturation ratio of 50.6 in February 2024.  Her ferritin at that time was 18.4.  Transferrin 216.   She was advised to stop iron supplement due to  elevated saturations and iron levels in her blood, especially since she was only taking a few times a month.  She was retested at her gynecology office for her ferritin in July 2024 and her ferritin had dropped from 18.4-15 in those 5 months.       07/30/2023    8:21 AM 12/20/2022    3:26 PM 10/15/2021    2:37 PM 09/14/2020    8:01 AM 02/22/2020    1:25 PM  Depression screen PHQ 2/9  Decreased Interest 0 0 0 0 0  Down, Depressed, Hopeless 0 0 0 0 0  PHQ - 2 Score 0 0 0 0 0  Altered sleeping  0     PHQ-9 Score  0          No data to display           Immunization History  Administered Date(s) Administered   Hepatitis A 08/15/2016, 02/11/2017   Hepatitis B 01/31/1987   Influenza,inj,Quad PF,6+ Mos 07/21/2021, 07/05/2022   Influenza-Unspecified 09/07/2019, 08/02/2020, 07/25/2023   PFIZER Comirnaty(Gray Top)Covid-19 Tri-Sucrose Vaccine 08/13/2022   PFIZER(Purple Top)SARS-COV-2 Vaccination 02/08/2020, 02/29/2020, 09/15/2020   Pfizer Covid-19 Vaccine Bivalent Booster 79yrs & up 08/09/2021   Tdap 08/15/2016     Past Medical History:  Diagnosis Date   Allergic reaction 02/23/2020   Itchy mouth with watermelon and cucumber.  Refer to allergist.   Allergic rhinitis    Allergy    Anemia 2021   Anxiety Since childhood   Blood in stool 2010   Patient reports hospitalization for blood in stool, diarrhea and extreme dehydration.  She was hospitalized at this time and  a colonoscopy was completed.   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   Frequent headaches    GERD (gastroesophageal reflux disease)    Heart murmur    Hypertension    Iron deficiency anemia    Irregular menstrual bleeding 2015   Numbness and tingling of upper extremity 02/23/2020   Osteoarthritis of knee 2016   Other allergic rhinitis 03/23/2020   Patellofemoral pain syndrome 04/2015   Tinea corporis    Tonsil stone 2016   Vitamin  D deficiency    No Known Allergies Past Surgical History:  Procedure Laterality Date   BREAST BIOPSY Left 08/2016   fibroadenoma   BREAST BIOPSY Left 01/15/2021   BREAST BIOPSY Left 2023   CESAREAN SECTION  2012   WISDOM TOOTH EXTRACTION     Family History  Problem Relation Age of Onset   Arthritis Mother    Hyperlipidemia Mother    Hypertension Mother    Learning disabilities Mother    Allergic rhinitis Mother    ALS Mother    Alcohol abuse Father    Depression Father    Vision loss Father    Asthma Brother    Hyperlipidemia Brother    Hypertension Brother    Allergic rhinitis Brother    Asthma Brother    Hyperlipidemia Brother    Hypertension Brother    Allergic rhinitis Brother    Bladder Cancer Maternal Grandmother    Early death Maternal Grandmother    Skin cancer Maternal Grandfather    Early death Maternal Grandfather    Stroke Maternal Grandfather    Hypertension Maternal Grandfather    Skin cancer Paternal Grandmother    Skin cancer Paternal Grandfather    Allergic rhinitis Son    Eczema Son    Urticaria Son    Breast cancer Neg Hx    Social History   Social History Narrative   Marital status/children/pets: Married, 1 child.   Education/employment: High school graduate, stay-at-home mom   Safety:      -Wears a bicycle helmet riding a bike: Yes     -smoke alarm in the home:Yes     - wears seatbelt: Yes     - Feels safe in their relationships: Yes    Allergies as of 01/14/2024   No Known Allergies      Medication List        Accurate as of January 14, 2024 12:21 PM. If you have any questions, ask your nurse or doctor.          STOP taking these medications    Polysaccharide-Iron Complex 150 MG Caps Stopped by: Felix Pacini       TAKE these medications    ALLEGRA-D 24 HOUR PO Take by mouth.   amLODipine 10 MG tablet Commonly known as: NORVASC Take 1 tablet (10 mg total) by mouth daily.   EPINEPHrine 0.3 mg/0.3 mL Soaj  injection Commonly known as: EPI-PEN See admin instructions.   fluticasone 50 MCG/ACT nasal spray Commonly known as: FLONASE Place 2 sprays into both nostrils in the morning and at bedtime.   MULTI COMPLETE PO Take by mouth.   naproxen 500 MG tablet Commonly known as: Naprosyn Take 1 tablet (500 mg total) by mouth 2 (two) times daily as needed for mild pain.   olopatadine 0.1 % ophthalmic solution Commonly known as: PATANOL 1 drop 2 (two) times daily.   omeprazole 40 MG capsule Commonly  known as: PRILOSEC Take 1 capsule (40 mg total) by mouth daily.        All past medical history, surgical history, allergies, family history, immunizations andmedications were updated in the EMR today and reviewed under the history and medication portions of their EMR.     No results found for this or any previous visit (from the past 2160 hours).   ROS: 14 pt review of systems performed and negative (unless mentioned in an HPI)  Objective: BP 132/78   Pulse 70   Temp 98.3 F (36.8 C)   Ht 5\' 2"  (1.575 m)   Wt 171 lb 9.6 oz (77.8 kg)   LMP 01/12/2024   SpO2 99%   BMI 31.39 kg/m  Physical Exam Vitals and nursing note reviewed.  Constitutional:      General: She is not in acute distress.    Appearance: Normal appearance. She is not ill-appearing or toxic-appearing.  HENT:     Head: Normocephalic and atraumatic.     Right Ear: Tympanic membrane, ear canal and external ear normal. There is no impacted cerumen.     Left Ear: Tympanic membrane, ear canal and external ear normal. There is no impacted cerumen.     Nose: No congestion or rhinorrhea.     Mouth/Throat:     Mouth: Mucous membranes are moist.     Pharynx: Oropharynx is clear. No oropharyngeal exudate or posterior oropharyngeal erythema.  Eyes:     General: No scleral icterus.       Right eye: No discharge.        Left eye: No discharge.     Extraocular Movements: Extraocular movements intact.     Conjunctiva/sclera:  Conjunctivae normal.     Pupils: Pupils are equal, round, and reactive to light.  Cardiovascular:     Rate and Rhythm: Normal rate and regular rhythm.     Pulses: Normal pulses.     Heart sounds: Normal heart sounds. No murmur heard.    No friction rub. No gallop.  Pulmonary:     Effort: Pulmonary effort is normal. No respiratory distress.     Breath sounds: Normal breath sounds. No stridor. No wheezing, rhonchi or rales.  Chest:     Chest wall: No tenderness.  Abdominal:     General: Abdomen is flat. Bowel sounds are normal. There is no distension.     Palpations: Abdomen is soft. There is no mass.     Tenderness: There is no abdominal tenderness. There is no right CVA tenderness, left CVA tenderness, guarding or rebound.     Hernia: No hernia is present.  Musculoskeletal:        General: No swelling, tenderness or deformity. Normal range of motion.     Cervical back: Normal range of motion and neck supple. No rigidity or tenderness.     Right lower leg: No edema.     Left lower leg: No edema.  Lymphadenopathy:     Cervical: No cervical adenopathy.  Skin:    General: Skin is warm and dry.     Coloration: Skin is not jaundiced or pale.     Findings: No bruising, erythema, lesion or rash.  Neurological:     General: No focal deficit present.     Mental Status: She is alert and oriented to person, place, and time. Mental status is at baseline.     Cranial Nerves: No cranial nerve deficit.     Sensory: No sensory deficit.     Motor:  No weakness.     Coordination: Coordination normal.     Gait: Gait normal.     Deep Tendon Reflexes: Reflexes normal.  Psychiatric:        Mood and Affect: Mood normal.        Behavior: Behavior normal.        Thought Content: Thought content normal.        Judgment: Judgment normal.     No results found.  Assessment/plan: Sabryn Preslar is a 43 y.o. female present for cpe with Chronic Conditions/illness Management Primary hypertension/E66.9  (BMI 30-39.9)/Palpitations Stable Low sodium diet Routine exercise Continue amlodipine 10 mg QD   Vitamin D deficiency: Vitamin D levels up-to-date 06/04/2023 and normal at 44. Continue vitamin D 1000-2000 mcg daily.   Severe menstrual cramps/Menorrhagia with regular cycle Stable Continue naproxen a few days prior to menstrual cycle and through bleeding pattern. Has prescription through her gynecology office currently.   GERD Has not been evaluated by ENT and increased reflux likely cause of her difficulty swallowing. Continue omeprazole 40 mg daily Referral to gastroenterology placed to have further workup with EGD  Neck pain: New problem. Patient reports when she was having her swallow study completed they told her she had a ruptured disc that could be contributing to her dysphagia.  She has noticed more neck pain since that time. She is inquiring if there is anything she can do to help manage or prevent further progression of the changes appreciated on x-ray.  Unfortunately, there is no mention of this on the report of her swallow study. Therefore would need to get a dedicated cervical spine x-ray first. Thank consider referral to sports med, Ortho/neurosurgery versus physical therapy.  Patient would like to try physical therapy if appropriate.  Routine general medical examination at a health care facility Patient was encouraged to exercise greater than 150 minutes a week. Patient was encouraged to choose a diet filled with fresh fruits and vegetables, and lean meats. AVS provided to patient today for education/recommendation on gender specific health and safety maintenance. Colonoscopy: routine screen at 43 > cologuard ordered Mammogram: completed:03/05/2023 >[facility]>ordered Cervical cancer screening: last pap: 2021> pcp wnl-hpv- 5 yr Immunizations: tdap UTD 2017, Influenza UTD 2024(encouraged yearly) Infectious disease screening: HIV completed, Hep C completed DEXA: routine  screen  Return in about 24 weeks (around 06/30/2024) for Routine chronic condition follow-up.   Orders Placed This Encounter  Procedures   MM 3D SCREENING MAMMOGRAM BILATERAL BREAST   DG Cervical Spine Complete   MR BREAST W & WO CM SCREENING (GI)   CBC   Comprehensive metabolic panel   Hemoglobin A1c   Lipid panel   TSH   Cologuard   Ambulatory referral to Gastroenterology   Meds ordered this encounter  Medications   amLODipine (NORVASC) 10 MG tablet    Sig: Take 1 tablet (10 mg total) by mouth daily.    Dispense:  90 tablet    Refill:  1   omeprazole (PRILOSEC) 40 MG capsule    Sig: Take 1 capsule (40 mg total) by mouth daily.    Dispense:  90 capsule    Refill:  1   Referral Orders         Ambulatory referral to Gastroenterology     42 minutes was spent with patient in addition to the normal preventative physical exam appointment time, to cover multiple chronic issues that required image studies, labs and referral placements.  In addition, 1 new problem was discussed today and will  be evaluated by imaging study and possible referral to PT.  Patient will be called with those results and further plan discussed.  Electronically signed by: Felix Pacini, DO [facility]

## 2024-01-15 ENCOUNTER — Encounter: Payer: Self-pay | Admitting: Physician Assistant

## 2024-01-15 ENCOUNTER — Telehealth: Payer: Self-pay | Admitting: Family Medicine

## 2024-01-15 DIAGNOSIS — M5030 Other cervical disc degeneration, unspecified cervical region: Secondary | ICD-10-CM

## 2024-01-15 DIAGNOSIS — M9979 Connective tissue and disc stenosis of intervertebral foramina of abdomen and other regions: Secondary | ICD-10-CM

## 2024-01-15 DIAGNOSIS — M542 Cervicalgia: Secondary | ICD-10-CM

## 2024-01-15 NOTE — Telephone Encounter (Signed)
 Please call patient Liver, kidney and thyroid functions are normal. Blood cell counts, electrolytes and cholesterol levels are normal. Diabetes screening/A1c is in normal range.  Cervical spine/neck x-ray resulted with multilevel degenerative disc disease, which is degeneration/arthritis. She also has a narrowing of the C3/C4 disc space, with significant bone spur at that location.  This does suggest possible disc protrusion at this level.   We discussed physical therapy referral, and we can start with physical therapy.  If symptoms worsen instead of improving, then we would need to move forward with Ortho/neurosurgery referral and MRI.   I have placed a referral to physical therapy a [place] location

## 2024-01-15 NOTE — Telephone Encounter (Signed)
 Pt given results/recommendations.

## 2024-01-20 ENCOUNTER — Other Ambulatory Visit: Payer: Self-pay

## 2024-01-20 ENCOUNTER — Ambulatory Visit: Attending: Family Medicine | Admitting: Physical Therapy

## 2024-01-20 ENCOUNTER — Encounter: Payer: Self-pay | Admitting: Physical Therapy

## 2024-01-20 DIAGNOSIS — K219 Gastro-esophageal reflux disease without esophagitis: Secondary | ICD-10-CM | POA: Diagnosis not present

## 2024-01-20 DIAGNOSIS — R293 Abnormal posture: Secondary | ICD-10-CM

## 2024-01-20 DIAGNOSIS — G8929 Other chronic pain: Secondary | ICD-10-CM

## 2024-01-20 DIAGNOSIS — M5030 Other cervical disc degeneration, unspecified cervical region: Secondary | ICD-10-CM | POA: Diagnosis not present

## 2024-01-20 DIAGNOSIS — R1310 Dysphagia, unspecified: Secondary | ICD-10-CM | POA: Diagnosis not present

## 2024-01-20 DIAGNOSIS — M9979 Connective tissue and disc stenosis of intervertebral foramina of abdomen and other regions: Secondary | ICD-10-CM | POA: Diagnosis not present

## 2024-01-20 DIAGNOSIS — M542 Cervicalgia: Secondary | ICD-10-CM

## 2024-01-20 DIAGNOSIS — M6281 Muscle weakness (generalized): Secondary | ICD-10-CM

## 2024-01-20 DIAGNOSIS — R490 Dysphonia: Secondary | ICD-10-CM | POA: Diagnosis not present

## 2024-01-20 NOTE — Patient Instructions (Signed)

## 2024-01-20 NOTE — Therapy (Signed)
 OUTPATIENT PHYSICAL THERAPY CERVICAL EVALUATION   Patient Name: Maria Goodwin MRN: [id number] DOB:11/29/1980, 43 y.o., female Today's Date: 01/20/2024  END OF SESSION:   Past Medical History:  Diagnosis Date   Allergic reaction 02/23/2020   Itchy mouth with watermelon and cucumber.  Refer to allergist.   Allergic rhinitis    Allergy    Anemia 2021   Anxiety Since childhood   Blood in stool 2010   Patient reports hospitalization for blood in stool, diarrhea and extreme dehydration.  She was hospitalized at this time and a colonoscopy was completed.   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   Frequent headaches    GERD (gastroesophageal reflux disease)    Heart murmur    Hypertension    Iron deficiency anemia    Irregular menstrual bleeding 2015   Numbness and tingling of upper extremity 02/23/2020   Osteoarthritis of knee 2016   Other allergic rhinitis 03/23/2020   Patellofemoral pain syndrome 04/2015   Tinea corporis    Tonsil stone 2016   Vitamin D deficiency    Past Surgical History:  Procedure Laterality Date   BREAST BIOPSY Left 08/2016   fibroadenoma   BREAST BIOPSY Left 01/15/2021   BREAST BIOPSY Left 2023   CESAREAN SECTION  2012   WISDOM TOOTH EXTRACTION     Patient Active Problem List   Diagnosis Date Noted   GERD (gastroesophageal reflux disease) 01/14/2024   Encounter for breast cancer screening other than mammogram 01/14/2024   Neck pain 01/14/2024   Iron deficiency 07/30/2023   Pharyngoesophageal dysphagia 07/30/2023   Vitamin D deficiency 10/15/2021   Obstructive sleep apnea 05/18/2021   Abnormal mammogram 12/28/2020   Palpitations 06/13/2020   Severe menstrual cramps 06/13/2020   Menorrhagia with regular cycle 06/13/2020   Pollen-food allergy 03/23/2020   Hypertension 02/22/2020   E66.9 (BMI 30-39.9) 02/22/2020    PCP: Natalia Leatherwood, DO  REFERRING PROVIDER:  Natalia Leatherwood, DO  REFERRING DIAG: M50.30 (ICD-10-CM) - DDD (degenerative disc disease), cervical M99.79 (ICD-10-CM) - Narrowing of intervertebral disc space M54.2 (ICD-10-CM) - Neck pain  THERAPY DIAG:  No diagnosis found.  Rationale for Evaluation and Treatment: Rehabilitation  ONSET DATE: Chronic  SUBJECTIVE:                                                                                                                                                                                                         SUBJECTIVE STATEMENT: Patient presents with chronic neck pain  that she has had majority of her adult life. She has right arm pain that affects her ROM and function. She occasionally has numbness and tingling in her right arm but it is not consistent. 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 Neck pain changes and it feels very tight. When driving in the car she feels the most neck pain. It feels tight when she turns to look in her blind spots.  Sleep is not affected by her neck pain. She has headaches 1-2x week. Hand dominance: Right  PERTINENT HISTORY:  Anemia; anxiety; HTN; OA right knee  PAIN:  Are you having pain? Yes: NPRS scale: hard to give it a number but it feels uncomfortable Pain location: C- spine bilateral Lt> Rt  Pain description: tight; uncomfortable Aggravating factors: driving in the car Relieving factors: stretching  PRECAUTIONS: None  RED FLAGS: None     WEIGHT BEARING RESTRICTIONS: No  FALLS:  Has patient fallen in last 6 months? Yes. Number of falls fell walking in the woods. Patient verbalized she does not feel her balance is an issue.  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  DIAGNOSTIC FINDINGS:  IMPRESSION: Multilevel degenerative disc disease with severe narrowing of the C4-C5 disc space.  PATIENT SURVEYS:  NDI 8/50 16%  COGNITION: Overall cognitive status: Within functional limits for tasks assessed  SENSATION: Occasional numbness and tingling in Rt arm  POSTURE: rounded shoulders and forward head  PALPATION: Increased muscle spasms of bilateral upper trap, suboccipitals, and cervical paraspinals  Decreased upper T spine mobility joint mobs Grade III   CERVICAL ROM:   Active ROM A/PROM (deg) eval  Flexion 60  Extension 30  Right lateral flexion 32  Left lateral flexion 43  Right rotation 80  Left rotation 85   (Blank rows = not tested)  UPPER EXTREMITY ROM: WFL bilateral     UPPER EXTREMITY MMT:  MMT Right eval Left eval  Shoulder flexion 4 4+  Shoulder extension    Shoulder abduction 4+ 4+  Shoulder adduction    Shoulder extension    Shoulder internal rotation    Shoulder external rotation    Middle trapezius    Lower trapezius    Elbow flexion 5 5  Elbow extension 5 5  Wrist flexion    Wrist extension    Wrist ulnar deviation    Wrist radial deviation    Wrist pronation    Wrist supination    Grip strength     (Blank rows = not tested)    FUNCTIONAL TESTS:  5 times sit to stand: 11.92sec no hands  TREATMENT DATE:  01/20/2024 Initial evaluation & HEP created  PATIENT EDUCATION:  Education details: POC; DN handout & benefits; HEP; explanation of Xray results Person educated: Patient Education method: Explanation, Demonstration, and  Handouts Education comprehension: verbalized understanding, returned demonstration, and needs further education  HOME EXERCISE PROGRAM: Access Code: [email or web address] URL: [email or web address] Date: 01/20/2024 Prepared by: Claude Manges  Exercises - Seated Upper Trapezius Stretch  - 1 x daily - 7 x weekly - 2 sets - 20-30 hold - Seated Levator Scapulae Stretch  - 1 x daily - 7 x weekly - 2 sets - 10 reps - 20-30 hold - Seated Scapular Retraction  - 1 x daily - 7 x weekly - 1 sets - 10 reps - Seated Cervical Retraction  - 1 x daily - 7 x weekly - 1 sets - 10 reps - Doorway Pec Stretch at 90 Degrees Abduction  - 1 x daily - 7 x weekly - 2 sets - 20-30 hold  ASSESSMENT:  CLINICAL IMPRESSION: Patient is a 43 y.o. female who was seen today for physical therapy evaluation and treatment for cervical pain. Nevia presents to physical therapy with chronic neck pain that has been limiting her driving tolerance, and ability to volunteer at the dog shelter. Patient also has right shoulder pain and occasional has some numbness and tingling. Based on evaluation noted poor postural, muscle weakness, and increased muscle spasms. Educated patient on the benefit of dry needling and the process, patient verbalized interest. Patient is motivated and wants to get better. Patient will benefit from skilled PT to address the below impairments and improve overall function.   OBJECTIVE IMPAIRMENTS: decreased ROM, decreased strength, increased muscle spasms, impaired flexibility, postural dysfunction, and pain.   ACTIVITY LIMITATIONS: lifting and reach over head  PARTICIPATION LIMITATIONS: driving and community activity  PERSONAL FACTORS: Time since onset of injury/illness/exacerbation and 1-2 comorbidities: anxiety ; HTN  are also affecting patient's functional outcome.   REHAB POTENTIAL: Good  CLINICAL DECISION MAKING: Stable/uncomplicated  EVALUATION COMPLEXITY: Low   GOALS: Goals reviewed with  patient? Yes  SHORT TERM GOALS: Target date: 02/17/2024  Patient will be independent with initial HEP. Baseline:  Goal status: INITIAL  2.  Patient will report > or = to 30% improvement in symptoms since starting PT. Baseline:  Goal status: INITIAL  3.  Patient will demonstrate correct seated posture. Baseline:  Goal status: INITIAL   LONG TERM GOALS: Target date: 03/16/2024  Patient will demonstrate independence in advanced HEP. Baseline:  Goal status: INITIAL  2.  Patient will report > or = to 70% improvement in symptoms since starting PT. Baseline:  Goal status: INITIAL  3.  Patient will verbalize and demonstrate self-care strategies to manage pain including tissue mobility practices and change of position. Baseline:  Goal status: INITIAL   4.  Patient will be able to drive and look in her blind spot with < or = to 2/10 neck pain. Baseline:  Goal status: INITIAL  5.  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 Baseline:  Goal status: INITIAL     PLAN:  PT FREQUENCY: 1-2x/week  PT DURATION: 8 weeks  PLANNED INTERVENTIONS: 97164- PT Re-evaluation, 97110-Therapeutic exercises, 97530- Therapeutic activity, 97112- Neuromuscular re-education, 97535- Self Care, 29518- Manual therapy, [phone]- Canalith repositioning, [id number]- Aquatic Therapy, A6301- Electrical stimulation (unattended), [phone]- Electrical stimulation (manual), [id number]- Vasopneumatic device, [id number]- Ultrasound, [id number]- Traction (mechanical), [id number]- Ionotophoresis 4mg /ml Dexamethasone, Patient/Family education, Taping, Dry Needling, Joint mobilization, Joint manipulation, Spinal manipulation, Spinal mobilization, Vestibular training, Cryotherapy,  and Moist heat  PLAN FOR NEXT SESSION: Review HEP; DN to upper traps, suboccipitals & cervical multifidi; postural strengthening; MELT method on foam roll   Claude Manges, PT 01/20/24 2:21 PM [facility] [address] [place], [place] [address] Phone # [phone] Fax [phone]

## 2024-01-22 ENCOUNTER — Ambulatory Visit: Admitting: Physical Therapy

## 2024-01-22 DIAGNOSIS — G8929 Other chronic pain: Secondary | ICD-10-CM

## 2024-01-22 DIAGNOSIS — J3089 Other allergic rhinitis: Secondary | ICD-10-CM | POA: Diagnosis not present

## 2024-01-22 DIAGNOSIS — M9979 Connective tissue and disc stenosis of intervertebral foramina of abdomen and other regions: Secondary | ICD-10-CM | POA: Diagnosis not present

## 2024-01-22 DIAGNOSIS — J3081 Allergic rhinitis due to animal (cat) (dog) hair and dander: Secondary | ICD-10-CM | POA: Diagnosis not present

## 2024-01-22 DIAGNOSIS — R490 Dysphonia: Secondary | ICD-10-CM | POA: Diagnosis not present

## 2024-01-22 DIAGNOSIS — M5030 Other cervical disc degeneration, unspecified cervical region: Secondary | ICD-10-CM | POA: Diagnosis not present

## 2024-01-22 DIAGNOSIS — K219 Gastro-esophageal reflux disease without esophagitis: Secondary | ICD-10-CM | POA: Diagnosis not present

## 2024-01-22 DIAGNOSIS — M542 Cervicalgia: Secondary | ICD-10-CM

## 2024-01-22 DIAGNOSIS — J301 Allergic rhinitis due to pollen: Secondary | ICD-10-CM | POA: Diagnosis not present

## 2024-01-22 DIAGNOSIS — R1310 Dysphagia, unspecified: Secondary | ICD-10-CM | POA: Diagnosis not present

## 2024-01-22 NOTE — Patient Instructions (Signed)

## 2024-01-22 NOTE — Therapy (Signed)
 OUTPATIENT PHYSICAL THERAPY CERVICAL PROGRESS NOTE   Patient Name: Maria Goodwin MRN: [id number] DOB:[birth year], 43 y.o., female Today's Date: 01/22/2024  END OF SESSION:  PT End of Session - 01/22/24 1519     Visit Number 2    Date for PT Re-Evaluation 03/16/24    Authorization Type [employer] (50vl)    PT Start Time 1520    PT Stop Time 1600    PT Time Calculation (min) 40 min    Activity Tolerance Patient tolerated treatment well             Past Medical History:  Diagnosis Date   Allergic reaction 02/23/2020   Itchy mouth with watermelon and cucumber.  Refer to allergist.   Allergic rhinitis    Allergy    Anemia 2021   Anxiety Since childhood   Blood in stool 2010   Patient reports hospitalization for blood in stool, diarrhea and extreme dehydration.  She was hospitalized at this time and a colonoscopy was completed.   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   Frequent headaches    GERD (gastroesophageal reflux disease)    Heart murmur    Hypertension    Iron deficiency anemia    Irregular menstrual bleeding 2015   Numbness and tingling of upper extremity 02/23/2020   Osteoarthritis of knee 2016   Other allergic rhinitis 03/23/2020   Patellofemoral pain syndrome 04/2015   Tinea corporis    Tonsil stone 2016   Vitamin D deficiency    Past Surgical History:  Procedure Laterality Date   BREAST BIOPSY Left 08/2016   fibroadenoma   BREAST BIOPSY Left 01/15/2021   BREAST BIOPSY Left 2023   CESAREAN SECTION  2012   WISDOM TOOTH EXTRACTION     Patient Active Problem List   Diagnosis Date Noted   GERD (gastroesophageal reflux disease) 01/14/2024   Encounter for breast cancer screening other than mammogram 01/14/2024   Neck pain 01/14/2024   Iron deficiency 07/30/2023   Pharyngoesophageal dysphagia 07/30/2023   Vitamin D deficiency 10/15/2021   Obstructive sleep apnea 05/18/2021    Abnormal mammogram 12/28/2020   Palpitations 06/13/2020   Severe menstrual cramps 06/13/2020   Menorrhagia with regular cycle 06/13/2020   Pollen-food allergy 03/23/2020   Hypertension 02/22/2020   E66.9 (BMI 30-39.9) 02/22/2020    PCP: Natalia Leatherwood, DO  REFERRING PROVIDER: Natalia Leatherwood, DO  REFERRING DIAG: M50.30 (ICD-10-CM) - DDD (degenerative disc disease), cervical M99.79 (ICD-10-CM) - Narrowing of intervertebral disc space M54.2 (ICD-10-CM) - Neck pain  THERAPY DIAG:  Cervicalgia  Chronic right shoulder pain  Rationale for Evaluation and Treatment: Rehabilitation  ONSET DATE: Chronic  SUBJECTIVE:  SUBJECTIVE STATEMENT: I wasn't sure about how long to hold or how many reps for some of the exercises. Using the app.  Had acupuncture a long time ago.    EVAL: Patient presents with chronic neck pain that she has had majority of her adult life. She has right arm pain that affects her ROM and function. She occasionally has numbness and tingling in her right arm but it is not consistent. 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 Neck pain changes and it feels very tight. When driving in the car she feels the most neck pain. It feels tight when she turns to look in her blind spots.  Sleep is not affected by her neck pain. She has headaches 1-2x week. Hand dominance: Right  PERTINENT HISTORY:  Anemia; anxiety; HTN; OA right knee  PAIN:  Are you having pain? Yes: NPRS scale: < 5, not enough where I need to take something Pain location: C- spine bilateral Lt> Rt  Pain description: tight; uncomfortable Aggravating factors: driving in the car Relieving factors: stretching  PRECAUTIONS: None  RED FLAGS: None     WEIGHT BEARING  RESTRICTIONS: No  FALLS:  Has patient fallen in last 6 months? Yes. Number of falls fell walking in the woods. Patient verbalized she does not feel her balance is an issue.  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  DIAGNOSTIC FINDINGS:  IMPRESSION: Multilevel degenerative disc disease with severe narrowing of the C4-C5 disc space.  PATIENT SURVEYS:  NDI 8/50 16%  COGNITION: Overall cognitive status: Within functional limits for tasks assessed  SENSATION: Occasional numbness and tingling in Rt arm  POSTURE: rounded shoulders and forward head  PALPATION: Increased muscle spasms of bilateral upper trap, suboccipitals, and cervical paraspinals  Decreased upper T spine mobility joint mobs Grade III   CERVICAL ROM:   Active ROM A/PROM (deg) eval  Flexion 60  Extension 30  Right lateral flexion 32  Left lateral flexion 43  Right rotation 80  Left rotation 85   (Blank rows = not tested)  UPPER EXTREMITY ROM: WFL bilateral     UPPER EXTREMITY MMT:  MMT Right eval Left eval  Shoulder flexion 4 4+  Shoulder extension    Shoulder abduction 4+ 4+  Shoulder adduction    Shoulder extension    Shoulder internal rotation    Shoulder external rotation    Middle trapezius    Lower trapezius    Elbow flexion 5 5  Elbow extension 5 5  Wrist flexion    Wrist extension    Wrist ulnar deviation    Wrist radial deviation    Wrist pronation    Wrist supination    Grip strength     (Blank rows = not  tested)    FUNCTIONAL TESTS:  5 times sit to stand: 11.92sec no hands  TREATMENT DATE:  01/22/24: Review of initial HEP, some adjustment on Medbridge app for number of reps/sets Green band standing rows (anchored on doorknob) 10x (added to HEP) Green band standing  bil shoulder extension (on doorknob) 10x (added to HEP) Manual therapy: soft tissue mobilization to bil cervical musculature Trigger Point Dry Needling Initial Treatment: Pt instructed on Dry Needling rational, procedures, and possible side effects. Pt instructed to expect mild to moderate muscle soreness later in the day and/or into the next day.  Pt instructed in methods to reduce muscle soreness. Pt instructed to continue prescribed HEP. Patient verbalized understanding of these instructions and education.   Patient Verbal Consent Given: Yes Education Handout Provided: Yes Muscles Treated: bil cervical multifidi, bil upper traps, bil levator scap Electrical Stimulation Performed: No Treatment Response/Outcome: improved soft tissue mobility and decreased tender point size and number  01/20/2024 Initial evaluation & HEP created                                                                                                                                  PATIENT EDUCATION:  Education details: POC; DN handout & benefits; HEP; explanation of Xray results Person educated: Patient Education method: Explanation, Demonstration, and Handouts Education comprehension: verbalized understanding, returned demonstration, and needs further education  HOME EXERCISE PROGRAM: Access Code: [email or web address] URL: [email or web address] Date: 01/22/2024 Prepared by: Lavinia Sharps  Exercises - Seated Upper Trapezius Stretch  - 1 x daily - 7 x weekly - 1 sets - 20-30 hold - Seated Levator Scapulae Stretch  - 1 x daily - 7 x weekly - 1 sets - 3 reps - 20-30 hold - Seated Scapular Retraction  - 1 x daily - 7 x weekly - 1 sets - 10  reps - Seated Cervical Retraction  - 1 x daily - 7 x weekly - 1 sets - 10 reps - Doorway Pec Stretch at 90 Degrees Abduction  - 1 x daily - 7 x weekly - 1 sets - 3 reps - 20-30 hold - Standing Low Shoulder Row with Anchored Resistance  - 1 x daily - 7 x weekly - 1 sets - 10 reps - Shoulder Extension with Resistance Hands Down  - 1 x daily - 7 x weekly - 1 sets - 10 reps ASSESSMENT:  CLINICAL IMPRESSION: The patient had a positive initial response to DN with much improved soft tissue mobility and decreased size and number of tender points.  Therapist monitoring response and educating patient on what to expect following DN and how to optimize benefit with specific exercise.  Anticipate pain intensity and mobility will continue to improve over the next few days.  The patient was encouraged in regular performance of HEP post DN including soft tissue lengthening and strengthening exercises to enhance long term benefit.     OBJECTIVE IMPAIRMENTS: decreased ROM, decreased strength, increased muscle spasms, impaired flexibility, postural dysfunction, and pain.   ACTIVITY LIMITATIONS: lifting and reach over head  PARTICIPATION LIMITATIONS: driving and community activity  PERSONAL FACTORS: Time since onset of injury/illness/exacerbation and 1-2 comorbidities: anxiety ; HTN  are also affecting patient's functional outcome.  REHAB POTENTIAL: Good  CLINICAL DECISION MAKING: Stable/uncomplicated  EVALUATION COMPLEXITY: Low   GOALS: Goals reviewed with patient? Yes  SHORT TERM GOALS: Target date: 02/17/2024  Patient will be independent with initial HEP. Baseline:  Goal status: INITIAL  2.  Patient will report > or = to 30% improvement in symptoms since starting PT. Baseline:  Goal status: INITIAL  3.  Patient will demonstrate correct seated posture. Baseline:  Goal status: INITIAL   LONG TERM GOALS: Target date: 03/16/2024  Patient will demonstrate independence in advanced  HEP. Baseline:  Goal status: INITIAL  2.  Patient will report > or = to 70% improvement in symptoms since starting PT. Baseline:  Goal status: INITIAL  3.  Patient will verbalize and demonstrate self-care strategies to manage pain including tissue mobility practices and change of position. Baseline:  Goal status: INITIAL   4.  Patient will be able to drive and look in her blind spot with < or = to 2/10 neck pain. Baseline:  Goal status: INITIAL  5.  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 Baseline:  Goal status: INITIAL     PLAN:  PT FREQUENCY: 1-2x/week  PT DURATION: 8 weeks  PLANNED INTERVENTIONS: 97164- PT Re-evaluation, 97110-Therapeutic exercises, 97530- Therapeutic activity, 97112- Neuromuscular re-education, 97535- Self Care, 16109- Manual therapy, [phone]- Canalith repositioning, [id number]- Aquatic Therapy, U9811- Electrical stimulation (unattended), [phone]- Electrical stimulation (manual), 97016- Vasopneumatic device, [id number]- Ultrasound, [id number]- Traction (mechanical), [id number]- Ionotophoresis 4mg /ml Dexamethasone, Patient/Family education, Taping, Dry Needling, Joint mobilization, Joint manipulation, Spinal manipulation, Spinal mobilization, Vestibular training, Cryotherapy, and Moist heat  PLAN FOR NEXT SESSION: Review HEP;  assess response to DN#1 to upper traps, levators and cervical multifidi, add DN to suboccipitals and periscapular muscles as indicated; postural strengthening; MELT method on foam roll   Lavinia Sharps, PT 01/22/24 3:59 PM Phone: [phone] Fax: [phone]

## 2024-01-28 ENCOUNTER — Encounter: Payer: Self-pay | Admitting: Speech Pathology

## 2024-01-28 ENCOUNTER — Ambulatory Visit: Payer: Federal, State, Local not specified - PPO | Admitting: Speech Pathology

## 2024-01-28 ENCOUNTER — Other Ambulatory Visit: Payer: Self-pay

## 2024-01-28 DIAGNOSIS — R498 Other voice and resonance disorders: Secondary | ICD-10-CM

## 2024-01-28 DIAGNOSIS — K219 Gastro-esophageal reflux disease without esophagitis: Secondary | ICD-10-CM | POA: Diagnosis not present

## 2024-01-28 DIAGNOSIS — R1312 Dysphagia, oropharyngeal phase: Secondary | ICD-10-CM

## 2024-01-28 DIAGNOSIS — R490 Dysphonia: Secondary | ICD-10-CM | POA: Diagnosis not present

## 2024-01-28 DIAGNOSIS — M5030 Other cervical disc degeneration, unspecified cervical region: Secondary | ICD-10-CM | POA: Diagnosis not present

## 2024-01-28 DIAGNOSIS — R1310 Dysphagia, unspecified: Secondary | ICD-10-CM | POA: Diagnosis not present

## 2024-01-28 DIAGNOSIS — M9979 Connective tissue and disc stenosis of intervertebral foramina of abdomen and other regions: Secondary | ICD-10-CM | POA: Diagnosis not present

## 2024-01-28 NOTE — Therapy (Signed)
 OUTPATIENT SPEECH LANGUAGE PATHOLOGY VOICE EVALUATION   Patient Name: Maria Goodwin MRN: [id number] DOB:04/16/1981, 43 y.o., female Today's Date: 01/28/2024  PCP: Maria Leatherwood, DO REFERRING PROVIDER: Ashok Croon, MD  END OF SESSION:  End of Session - 01/28/24 1426     Visit Number 1    Number of Visits 1    SLP Start Time 1315    SLP Stop Time  1400    SLP Time Calculation (min) 45 min             Past Medical History:  Diagnosis Date   Allergic reaction 02/23/2020   Itchy mouth with watermelon and cucumber.  Refer to allergist.   Allergic rhinitis    Allergy    Anemia 2021   Anxiety Since childhood   Blood in stool 2010   Patient reports hospitalization for blood in stool, diarrhea and extreme dehydration.  She was hospitalized at this time and a colonoscopy was completed.   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   Frequent headaches    GERD (gastroesophageal reflux disease)    Heart murmur    Hypertension    Iron deficiency anemia    Irregular menstrual bleeding 2015   Numbness and tingling of upper extremity 02/23/2020   Osteoarthritis of knee 2016   Other allergic rhinitis 03/23/2020   Patellofemoral pain syndrome 04/2015   Tinea corporis    Tonsil stone 2016   Vitamin D deficiency    Past Surgical History:  Procedure Laterality Date   BREAST BIOPSY Left 08/2016   fibroadenoma   BREAST BIOPSY Left 01/15/2021   BREAST BIOPSY Left 2023   CESAREAN SECTION  2012   WISDOM TOOTH EXTRACTION     Patient Active Problem List   Diagnosis Date Noted   GERD (gastroesophageal reflux disease) 01/14/2024   Encounter for breast cancer screening other than mammogram 01/14/2024   Neck pain 01/14/2024   Iron deficiency 07/30/2023   Pharyngoesophageal dysphagia 07/30/2023   Vitamin D deficiency 10/15/2021   Obstructive sleep apnea 05/18/2021   Abnormal mammogram 12/28/2020    Palpitations 06/13/2020   Severe menstrual cramps 06/13/2020   Menorrhagia with regular cycle 06/13/2020   Pollen-food allergy 03/23/2020   Hypertension 02/22/2020   E66.9 (BMI 30-39.9) 02/22/2020    Onset date: 12/22/2023 (referral date)  REFERRING DIAG: R13.10 (ICD-10-CM) - Dysphagia, unspecified type R49.0 (ICD-10-CM) - Dysphonia K21.9 (ICD-10-CM) - Chronic GERD  THERAPY DIAG:  Other voice and resonance disorders  Dysphagia, oropharyngeal phase  Rationale for Evaluation and Treatment: Rehabilitation  SUBJECTIVE:   SUBJECTIVE STATEMENT: "My voice is much better since I've been on reflux meds Pt accompanied by:  Child - 6th grade  PERTINENT HISTORY: Maria Goodwin is an 43 y.o. female with hx of environmental allergies currently on allergy shots, history of tonsillar hypertrophy, here to discuss dysphagia dysphonia and symptoms of throat discomfort/tonsil stones.   She is here for intermittent trouble with swallowing with solid foods, where food would not go down quick enough and pill dysphagia x few years. She also tends to lose her voice intermittently. States her voice becomes "low" in pitch and with poor projection. She gets tonsil stones, has been an issue her whole life. She had always had a problem with recurrent bad breath. She takes Prilosec 40 mg for acid reflux and has been on it for 6 weeks, takes it at night before bed. Her acid reflux improved since she  started Prilosec. She has allergies with nasal congestion and post-nasal drainage. Her trouble with swallowing is not consistent and not present all the time  PAIN:  Are you having pain? No  FALLS: Has patient fallen in last 6 months? 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  DIAGNOSTIC FINDINGS: ENT: Intermittent dysphonia with raspy voice and decrease in projection for several months -Vocal folds appear healthy on flexible scope exam today, suspect symptoms are due to chronic irritation from GERD LPR and postnasal drainage  MBSS:Clinical Impression: Pt presents with a mild oropharyngeal dysphagia with vallecualr residue and decreased PES opening. Pt has a small curled epiglottis that does not fully invert at the height of the swallow despite adequate hyoid excursion. There is entrapment of thin liquids and mild solids residue. A second swallow or an effortful swallow are most beneficial. 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 Caffeine/alcohol intake: moderate Daily voice use: moderate  PERCEPTUAL VOICE ASSESSMENT: Voice quality: normal Vocal abuse: habitual throat clearing - 8x during this evaluation without awareness Resonance: normal Respiratory function: thoracic breathing  OBJECTIVE VOICE ASSESSMENT: Maximum phonation time for sustained "ah": 9.52 Conversational loudness average: 70 dB Conversational loudness range: 68-72 dB Pitch Range - WNL Loud Ah average 84dB  PATIENT REPORTED OUTCOME MEASURES (PROM): V-RQOL: 20  is raw score; Calculated score is 75 which is borderline - greater than 80 is WNL                                                                                                                            TREATMENT DATE:   01/28/24: Eval completed - Trained pt in voice exercises for volume and  projection with rare min A. She completed these with mod I and was instructed to complete HEP 2x a day for 6 weeks. Education and demonstration in use of abdominal breath for voice projection. She achieved 85dB on 18/20 sentences with rare min A. She verbalized throat clear alternatives, however she repeatedly cleared her throat - after cueing, she demonstrated throat clear suppression 3x - Tzirel required consistent verbal cues to ID throat clearing behavior. Education provided re: reflux precautions. She has not tried Reflux Gourmet - provided hand out re: this supplement and ENT recommendation to gargle with salt water (she forgot this recommendation). Reviewed MBSS recommendations for dry swallows with PO - She endorses she is following  this recommendation with success. Elliot has not lost her voice since starting reflux meds which was her biggest concern. She elects to complete HEP at home and to not return to ST. She is aware if voice or swallow worsens to return to laryngologist.    PATIENT EDUCATION: Education details: See Treatment, See Patient Instructions, throat clear alternatives, high intensity voice exercises, swallow precautions, Reflux precautions Person educated: Patient Education method: Explanation, Demonstration, Verbal cues, and Handouts Education comprehension: verbalized understanding and returned demonstration  HOME EXERCISE PROGRAM: High intensity voice exercises for projection    ASSESSMENT:  CLINICAL IMPRESSION: Patient is a 43 y.o. female who was seen today for dysphonia and dysphagia. She reports voice has improved since starting reflux meds and that she hasn't lost her voice since starting reflux meds. She is managing PO with swallow precautions of hard swallow and dry swallow with success. She does endorse difficulty projecting her voice over very noisy places however she denied concern re: this. She did achieve WNL volume in conversation and 84-90dB in high  intensity voice tasks with mod I. Skilled ST is not recommended at this time. Provided Aleeyah with voice exercises to be completed at home. She is in agreement  OBJECTIVE IMPAIRMENTS: include voice disorder and dysphagia. These impairments are limiting patient from  n/a .    Anastasiya Gowin, Radene Journey, CCC-SLP 01/28/2024, 2:27 PM

## 2024-01-28 NOTE — Patient Instructions (Addendum)
   Reflux Gourmet - 1 teaspoon after every meal and before bed  Gargle with warm salt water twice a day  PhoRTE - you tube twice a day for 6 weeks Loud Ah 10x for 10 seconds Pitch glides up and down 10x 10 sentences in high pitch like you are calling to a neighbor over fence 10 sentences in a low pitch like you are the boss 10 sentences in a strong voice - someone should you in the next room   ACID REFLUX PRECAUTIONS ACID REFLUX can be a possible cause of a voice disorder or throat irritation. Acid reflux is a disorder where acid from your stomach is abnormally spilled over onto your voice box after eating, during sleep, or even during singing. Acid reflux causes irritation and inflammation to your vocal folds and should be avoided and treated by changing eating habits, changing lifestyle, and taking medication (if prescribed by your doctor).   CHANGE EATING HABITS   Avoid "trigger" foods. Certain foods and drinks can trigger acid reflux.  1. Caffeine- in coffee, tea, chocolate, sodas  2. Carbonated beverages  3. Mint and menthol  4. Fatty/fried foods  5. Citrus fruits  6. Tomato products  7. Spicy foods  8. Alcohol    CHANGING LIFESTYLE HABITS   Drink 8 glasses of water per day (64oz)    Stop smoking   Avoid clearing your throat   Allow 3 hours between last big meal and going to bed at night   Keep yourself upright for one hour after you eat   Elevate the head of your bed using 6-inch blocks under the head of the bed or a bed wedge between the box spring and the mattress.   Eat small meals throughout the day rather than 3 big meals   Eat slowly   Wear loose clothing     CHANGING THE WAY YOU USE YOUR VOICE  "Best voice/ Least effort"

## 2024-01-29 ENCOUNTER — Ambulatory Visit: Admitting: Physical Therapy

## 2024-01-29 DIAGNOSIS — R490 Dysphonia: Secondary | ICD-10-CM | POA: Diagnosis not present

## 2024-01-29 DIAGNOSIS — J301 Allergic rhinitis due to pollen: Secondary | ICD-10-CM | POA: Diagnosis not present

## 2024-01-29 DIAGNOSIS — J3089 Other allergic rhinitis: Secondary | ICD-10-CM | POA: Diagnosis not present

## 2024-01-29 DIAGNOSIS — M9979 Connective tissue and disc stenosis of intervertebral foramina of abdomen and other regions: Secondary | ICD-10-CM | POA: Diagnosis not present

## 2024-01-29 DIAGNOSIS — G8929 Other chronic pain: Secondary | ICD-10-CM

## 2024-01-29 DIAGNOSIS — K219 Gastro-esophageal reflux disease without esophagitis: Secondary | ICD-10-CM | POA: Diagnosis not present

## 2024-01-29 DIAGNOSIS — M542 Cervicalgia: Secondary | ICD-10-CM

## 2024-01-29 DIAGNOSIS — M5030 Other cervical disc degeneration, unspecified cervical region: Secondary | ICD-10-CM | POA: Diagnosis not present

## 2024-01-29 DIAGNOSIS — M6281 Muscle weakness (generalized): Secondary | ICD-10-CM

## 2024-01-29 DIAGNOSIS — R1310 Dysphagia, unspecified: Secondary | ICD-10-CM | POA: Diagnosis not present

## 2024-01-29 DIAGNOSIS — J3081 Allergic rhinitis due to animal (cat) (dog) hair and dander: Secondary | ICD-10-CM | POA: Diagnosis not present

## 2024-01-29 DIAGNOSIS — R293 Abnormal posture: Secondary | ICD-10-CM

## 2024-01-29 NOTE — Therapy (Signed)
 OUTPATIENT PHYSICAL THERAPY CERVICAL PROGRESS NOTE   Patient Name: Maria Goodwin MRN: [id number] DOB:09/11/1981, 43 y.o., female Today's Date: 01/29/2024  END OF SESSION:  PT End of Session - 01/29/24 1007     Visit Number 3    Date for PT Re-Evaluation 03/16/24    Authorization Type BCBS (50vl)    PT Start Time 1010    PT Stop Time 1050    PT Time Calculation (min) 40 min    Activity Tolerance Patient tolerated treatment well              Past Medical History:  Diagnosis Date   Allergic reaction 02/23/2020   Itchy mouth with watermelon and cucumber.  Refer to allergist.   Allergic rhinitis    Allergy    Anemia 2021   Anxiety Since childhood   Blood in stool 2010   Patient reports hospitalization for blood in stool, diarrhea and extreme dehydration.  She was hospitalized at this time and a colonoscopy was completed.   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   Frequent headaches    GERD (gastroesophageal reflux disease)    Heart murmur    Hypertension    Iron deficiency anemia    Irregular menstrual bleeding 2015   Numbness and tingling of upper extremity 02/23/2020   Osteoarthritis of knee 2016   Other allergic rhinitis 03/23/2020   Patellofemoral pain syndrome 04/2015   Tinea corporis    Tonsil stone 2016   Vitamin D deficiency    Past Surgical History:  Procedure Laterality Date   BREAST BIOPSY Left 08/2016   fibroadenoma   BREAST BIOPSY Left 01/15/2021   BREAST BIOPSY Left 2023   CESAREAN SECTION  2012   WISDOM TOOTH EXTRACTION     Patient Active Problem List   Diagnosis Date Noted   GERD (gastroesophageal reflux disease) 01/14/2024   Encounter for breast cancer screening other than mammogram 01/14/2024   Neck pain 01/14/2024   Iron deficiency 07/30/2023   Pharyngoesophageal dysphagia 07/30/2023   Vitamin D deficiency 10/15/2021   Obstructive sleep apnea 05/18/2021    Abnormal mammogram 12/28/2020   Palpitations 06/13/2020   Severe menstrual cramps 06/13/2020   Menorrhagia with regular cycle 06/13/2020   Pollen-food allergy 03/23/2020   Hypertension 02/22/2020   E66.9 (BMI 30-39.9) 02/22/2020    PCP: Natalia Leatherwood, DO  REFERRING PROVIDER: Natalia Leatherwood, DO  REFERRING DIAG: M50.30 (ICD-10-CM) - DDD (degenerative disc disease), cervical M99.79 (ICD-10-CM) - Narrowing of intervertebral disc space M54.2 (ICD-10-CM) - Neck pain  THERAPY DIAG:  No diagnosis found.  Rationale for Evaluation and Treatment: Rehabilitation  ONSET DATE: Chronic  SUBJECTIVE:  SUBJECTIVE STATEMENT: Pt reports stretching has been going well. Did stop the rowing exercises because she was getting posterior arm pain down to her elbow. At rest 0/10 pain but reaching it feels like she can't move it that way. Lifting, shifting weight on the arm and moving the arm also feels irritating to that posterior arm pain.   EVAL: Patient presents with chronic neck pain that she has had majority of her adult life. She has right arm pain that affects her ROM and function. She occasionally has numbness and tingling in her right arm but it is not consistent. 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 Neck pain changes and it feels very tight. When driving in the car she feels the most neck pain. It feels tight when she turns to look in her blind spots.  Sleep is not affected by her neck pain. She has headaches 1-2x week. Hand dominance: Right  PERTINENT HISTORY:  Anemia; anxiety; HTN; OA right knee  PAIN:  Are you having pain? Yes: NPRS scale: < 5, not enough where I need to take something Pain location: C- spine bilateral Lt> Rt  Pain description:  tight; uncomfortable Aggravating factors: driving in the car Relieving factors: stretching  PRECAUTIONS: None  RED FLAGS: None     WEIGHT BEARING RESTRICTIONS: No  FALLS:  Has patient fallen in last 6 months? Yes. Number of falls fell walking in the woods. Patient verbalized she does not feel her balance is an issue.  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  DIAGNOSTIC FINDINGS:  IMPRESSION: Multilevel degenerative disc disease with severe narrowing of the C4-C5 disc space.  PATIENT SURVEYS:  NDI 8/50 16%  COGNITION: Overall cognitive status: Within functional limits for tasks assessed  SENSATION: Occasional numbness and tingling in Rt arm  POSTURE: rounded shoulders and forward head  PALPATION: Increased muscle spasms of bilateral upper trap, suboccipitals, and cervical paraspinals  Decreased upper T spine mobility joint mobs Grade III   CERVICAL ROM:   Active ROM A/PROM (deg) eval  Flexion 60  Extension 30  Right lateral flexion 32  Left lateral flexion 43  Right rotation 80  Left rotation 85   (Blank rows = not tested)  UPPER EXTREMITY ROM: WFL bilateral     UPPER EXTREMITY MMT:  MMT Right eval Left eval  Shoulder flexion 4 4+  Shoulder extension    Shoulder abduction 4+ 4+  Shoulder adduction    Shoulder extension    Shoulder internal rotation    Shoulder external rotation    Middle trapezius    Lower trapezius    Elbow flexion 5 5  Elbow extension 5 5  Wrist flexion     Wrist extension    Wrist ulnar deviation    Wrist radial deviation    Wrist pronation    Wrist supination    Grip strength     (Blank rows = not tested)    FUNCTIONAL TESTS:  5 times sit to stand: 11.92 sec no hands  TREATMENT DATE:  01/29/24 UBE L1; 3 min fwd, 2 min bwd Doorway pec stretch, low, mid, high x30 sec each Upper trap stretch x30" Levator scap stretch x 30" Cross body stretch x 30" "W" supine x10 Scap squeeze supine x10 Snow angel elbows bent x10, elbow straight x10 Horizontal shoulder abd supine red TB 2x10 Skilled palpation and assessment for TPDN STM & TPR bilat UTs, cervical paraspinals Trigger Point Dry Needling  Subsequent Treatment: Instructions provided previously at initial dry needling treatment.   Patient Verbal Consent Given: Yes Education Handout Provided: Previously Provided Muscles Treated: bilat rhomboids, UTs, cervical paraspinals Electrical Stimulation Performed: No Treatment Response/Outcome: Twitch response, decreased muscle tension  Reviewed form for row and shoulder ext    01/22/24: Review of initial HEP, some adjustment on Medbridge app for number of reps/sets Green band standing rows (anchored on doorknob) 10x (added to HEP) Green band standing bil shoulder extension (on doorknob) 10x (added to HEP) Manual therapy: soft tissue mobilization to bil cervical musculature Trigger Point Dry Needling Initial Treatment: Pt instructed on Dry Needling rational, procedures, and possible side effects. Pt instructed to expect mild to moderate muscle soreness later in the day and/or into the next day.  Pt instructed in methods to reduce muscle soreness. Pt instructed to continue prescribed HEP. Patient verbalized understanding of these instructions and education.   Patient Verbal Consent Given: Yes Education Handout Provided: Yes Muscles Treated: bil cervical multifidi, bil upper traps, bil levator scap Electrical Stimulation Performed:  No Treatment Response/Outcome: improved soft tissue mobility and decreased tender point size and number  01/20/2024 Initial evaluation & HEP created                                                                                                                                  PATIENT EDUCATION:  Education details: POC; DN handout & benefits; HEP; explanation of Xray results Person educated: Patient Education method: Explanation, Demonstration, and Handouts Education comprehension: verbalized understanding, returned demonstration, and needs further education  HOME EXERCISE PROGRAM: Access Code: [email or web address] URL: [email or web address] Date: 01/22/2024 Prepared by: Lavinia Sharps  Exercises - Seated Upper Trapezius Stretch  - 1 x daily - 7 x weekly - 1 sets - 20-30 hold - Seated Levator Scapulae Stretch  - 1 x daily - 7 x weekly - 1 sets - 3 reps - 20-30 hold - Seated Scapular Retraction  - 1 x daily - 7 x weekly - 1 sets - 10 reps - Seated Cervical Retraction  - 1 x daily - 7 x weekly - 1 sets - 10 reps - Doorway Pec Stretch at 90 Degrees Abduction  - 1 x daily - 7 x weekly - 1 sets - 3 reps - 20-30 hold - Standing Low Shoulder Row with Anchored Resistance  - 1 x daily - 7 x weekly - 1 sets - 10 reps - Shoulder Extension with Resistance Hands Down  - 1 x daily - 7 x weekly -  1 sets - 10 reps   ASSESSMENT:  CLINICAL IMPRESSION: Pt with radiating symptoms down the back of her arm. Replicated with nerve tension testing of ulnar nerve. Added nerve glides. Continued pec stretching to open up her thoracic outlet (shoulder horizontal adduction and horizontal abduction replicate her posterior shoulder pain). Continued TPDN to address her neck stiffness. Reviewed her form with posterior shoulder girdle strengthening to decrease anterior shoulder impingement/pressure into her thoracic outlet.    OBJECTIVE IMPAIRMENTS: decreased ROM, decreased strength, increased muscle spasms,  impaired flexibility, postural dysfunction, and pain.    GOALS: Goals reviewed with patient? Yes  SHORT TERM GOALS: Target date: 02/17/2024  Patient will be independent with initial HEP. Baseline:  Goal status: INITIAL  2.  Patient will report > or = to 30% improvement in symptoms since starting PT. Baseline:  Goal status: INITIAL  3.  Patient will demonstrate correct seated posture. Baseline:  Goal status: INITIAL   LONG TERM GOALS: Target date: 03/16/2024  Patient will demonstrate independence in advanced HEP. Baseline:  Goal status: INITIAL  2.  Patient will report > or = to 70% improvement in symptoms since starting PT. Baseline:  Goal status: INITIAL  3.  Patient will verbalize and demonstrate self-care strategies to manage pain including tissue mobility practices and change of position. Baseline:  Goal status: INITIAL   4.  Patient will be able to drive and look in her blind spot with < or = to 2/10 neck pain. Baseline:  Goal status: INITIAL  5.  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 Baseline:  Goal status: INITIAL     PLAN:  PT FREQUENCY: 1-2x/week  PT DURATION: 8 weeks  PLANNED INTERVENTIONS: 97164- PT Re-evaluation, 97110-Therapeutic exercises, 97530- Therapeutic activity, 97112- Neuromuscular re-education, 97535- Self Care, 16109- Manual therapy, [phone]- Canalith repositioning, [id number]- Aquatic Therapy, [phone]- Electrical stimulation (unattended), [phone]- Electrical stimulation (manual), 97016- Vasopneumatic device, [id number]- Ultrasound, [id number]- Traction (mechanical), [id number]- Ionotophoresis 4mg /ml Dexamethasone, Patient/Family education, Taping, Dry Needling, Joint mobilization, Joint manipulation, Spinal manipulation, Spinal mobilization, Vestibular training, Cryotherapy, and Moist heat  PLAN FOR NEXT SESSION: Review HEP;  assess response to DN#1 to upper traps, levators and cervical multifidi, add DN to  suboccipitals and periscapular muscles as indicated; postural strengthening; MELT method on foam roll   Silvanna Ohmer April Dell Ponto, PT, DPT 01/29/24 10:08 AM Phone: [phone] Fax: [phone]

## 2024-02-02 NOTE — Telephone Encounter (Signed)
 Message to billing to f/u. See account notes.   Encounter closed.

## 2024-02-03 ENCOUNTER — Ambulatory Visit: Admitting: Physical Therapy

## 2024-02-03 DIAGNOSIS — M542 Cervicalgia: Secondary | ICD-10-CM

## 2024-02-03 DIAGNOSIS — R1310 Dysphagia, unspecified: Secondary | ICD-10-CM | POA: Diagnosis not present

## 2024-02-03 DIAGNOSIS — G8929 Other chronic pain: Secondary | ICD-10-CM

## 2024-02-03 DIAGNOSIS — M9979 Connective tissue and disc stenosis of intervertebral foramina of abdomen and other regions: Secondary | ICD-10-CM | POA: Diagnosis not present

## 2024-02-03 DIAGNOSIS — M6281 Muscle weakness (generalized): Secondary | ICD-10-CM

## 2024-02-03 DIAGNOSIS — M5030 Other cervical disc degeneration, unspecified cervical region: Secondary | ICD-10-CM | POA: Diagnosis not present

## 2024-02-03 DIAGNOSIS — K219 Gastro-esophageal reflux disease without esophagitis: Secondary | ICD-10-CM | POA: Diagnosis not present

## 2024-02-03 DIAGNOSIS — R490 Dysphonia: Secondary | ICD-10-CM | POA: Diagnosis not present

## 2024-02-03 DIAGNOSIS — R293 Abnormal posture: Secondary | ICD-10-CM

## 2024-02-03 NOTE — Therapy (Signed)
 OUTPATIENT PHYSICAL THERAPY CERVICAL PROGRESS NOTE   Patient Name: Maria Goodwin MRN: [id number] DOB:17-Sep-1981, 43 y.o., female Today's Date: 02/03/2024  END OF SESSION:  PT End of Session - 02/03/24 0749     Visit Number 4    Date for PT Re-Evaluation 03/16/24    Authorization Type BCBS (50vl)    PT Start Time 0755    PT Stop Time 0835    PT Time Calculation (min) 40 min    Activity Tolerance Patient tolerated treatment well               Past Medical History:  Diagnosis Date   Allergic reaction 02/23/2020   Itchy mouth with watermelon and cucumber.  Refer to allergist.   Allergic rhinitis    Allergy    Anemia 2021   Anxiety Since childhood   Blood in stool 2010   Patient reports hospitalization for blood in stool, diarrhea and extreme dehydration.  She was hospitalized at this time and a colonoscopy was completed.   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   Frequent headaches    GERD (gastroesophageal reflux disease)    Heart murmur    Hypertension    Iron deficiency anemia    Irregular menstrual bleeding 2015   Numbness and tingling of upper extremity 02/23/2020   Osteoarthritis of knee 2016   Other allergic rhinitis 03/23/2020   Patellofemoral pain syndrome 04/2015   Tinea corporis    Tonsil stone 2016   Vitamin D deficiency    Past Surgical History:  Procedure Laterality Date   BREAST BIOPSY Left 08/2016   fibroadenoma   BREAST BIOPSY Left 01/15/2021   BREAST BIOPSY Left 2023   CESAREAN SECTION  2012   WISDOM TOOTH EXTRACTION     Patient Active Problem List   Diagnosis Date Noted   GERD (gastroesophageal reflux disease) 01/14/2024   Encounter for breast cancer screening other than mammogram 01/14/2024   Neck pain 01/14/2024   Iron deficiency 07/30/2023   Pharyngoesophageal dysphagia 07/30/2023   Vitamin D deficiency 10/15/2021   Obstructive sleep apnea 05/18/2021    Abnormal mammogram 12/28/2020   Palpitations 06/13/2020   Severe menstrual cramps 06/13/2020   Menorrhagia with regular cycle 06/13/2020   Pollen-food allergy 03/23/2020   Hypertension 02/22/2020   E66.9 (BMI 30-39.9) 02/22/2020    PCP: Natalia Leatherwood, DO  REFERRING PROVIDER: Natalia Leatherwood, DO  REFERRING DIAG: M50.30 (ICD-10-CM) - DDD (degenerative disc disease), cervical M99.79 (ICD-10-CM) - Narrowing of intervertebral disc space M54.2 (ICD-10-CM) - Neck pain  THERAPY DIAG:  Cervicalgia  Chronic right shoulder pain  Abnormal posture  Muscle weakness (generalized)  Rationale for Evaluation and Treatment: Rehabilitation  ONSET DATE: Chronic  SUBJECTIVE:  SUBJECTIVE STATEMENT: Pt states after the last appointment she has felt that it was night and day. Pt reports feels she can put more pressure on her arm. Pt states the needling helped and did tighten up a little bit.   EVAL: Patient presents with chronic neck pain that she has had majority of her adult life. She has right arm pain that affects her ROM and function. She occasionally has numbness and tingling in her right arm but it is not consistent. 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 Neck pain changes and it feels very tight. When driving in the car she feels the most neck pain. It feels tight when she turns to look in her blind spots.  Sleep is not affected by her neck pain. She has headaches 1-2x week. Hand dominance: Right  PERTINENT HISTORY:  Anemia; anxiety; HTN; OA right knee  PAIN:  Are you having pain? Yes: NPRS scale: 0, not enough where I need to take something Pain location: C- spine bilateral Lt> Rt  Pain description: tight; uncomfortable Aggravating factors:  driving in the car Relieving factors: stretching  PRECAUTIONS: None  RED FLAGS: None     WEIGHT BEARING RESTRICTIONS: No  FALLS:  Has patient fallen in last 6 months? Yes. Number of falls fell walking in the woods. Patient verbalized she does not feel her balance is an issue.  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  DIAGNOSTIC FINDINGS:  IMPRESSION: Multilevel degenerative disc disease with severe narrowing of the C4-C5 disc space.  PATIENT SURVEYS:  NDI 8/50 16%  COGNITION: Overall cognitive status: Within functional limits for tasks assessed  SENSATION: Occasional numbness and tingling in Rt arm  POSTURE: rounded shoulders and forward head  PALPATION: Increased muscle spasms of bilateral upper trap, suboccipitals, and cervical paraspinals  Decreased upper T spine mobility joint mobs Grade III   CERVICAL ROM:   Active ROM A/PROM (deg) eval  Flexion 60  Extension 30  Right lateral flexion 32  Left lateral flexion 43  Right rotation 80  Left rotation 85   (Blank rows = not tested)  UPPER EXTREMITY ROM: WFL bilateral     UPPER EXTREMITY MMT:  MMT Right eval Left eval  Shoulder flexion 4 4+  Shoulder extension    Shoulder abduction 4+ 4+  Shoulder adduction    Shoulder extension    Shoulder internal rotation    Shoulder external rotation    Middle trapezius    Lower trapezius    Elbow flexion 5 5  Elbow extension 5 5  Wrist flexion    Wrist extension    Wrist ulnar deviation     Wrist radial deviation    Wrist pronation    Wrist supination    Grip strength     (Blank rows = not tested)    FUNCTIONAL TESTS:  5 times sit to stand: 11.92 sec no hands  TREATMENT DATE:  02/03/24 Doorway pec stretch, low, mid, high 2x30 sec each Upper trap stretch x 30" Levator scap stretch  x 30" Ulnar nerve glide in scaption x10 On foam roll:  Horizontal shoulder abd yellow TB 2x10  "W" yellow TB 2x10  Snow angel elbows bent 2x10  Serratus ant yellow TB 2x10  Hands behind head pec stretch 2x30" Self care: Self myofacial release/massage on foam roll, tennis ball for pecs   01/29/24 UBE L1; 3 min fwd, 2 min bwd Doorway pec stretch, low, mid, high x30 sec each Upper trap stretch x30" Levator scap stretch x 30" Cross body stretch x 30" "W" supine x10 Scap squeeze supine x10 Snow angel elbows bent x10, elbow straight x10 Horizontal shoulder abd supine red TB 2x10 Skilled palpation and assessment for TPDN STM & TPR bilat UTs, cervical paraspinals Trigger Point Dry Needling  Subsequent Treatment: Instructions provided previously at initial dry needling treatment.   Patient Verbal Consent Given: Yes Education Handout Provided: Previously Provided Muscles Treated: bilat rhomboids, UTs, cervical paraspinals Electrical Stimulation Performed: No Treatment Response/Outcome: Twitch response, decreased muscle tension  Reviewed form for row and shoulder ext    01/22/24: Review of initial HEP, some adjustment on Medbridge app for number of reps/sets Green band standing rows (anchored on doorknob) 10x (added to HEP) Green band standing bil shoulder extension (on doorknob) 10x (added to HEP) Manual therapy: soft tissue mobilization to bil cervical musculature Trigger Point Dry Needling Initial Treatment: Pt instructed on Dry Needling rational, procedures, and possible side effects. Pt instructed to expect mild to moderate muscle soreness later in the day and/or into the next  day.  Pt instructed in methods to reduce muscle soreness. Pt instructed to continue prescribed HEP. Patient verbalized understanding of these instructions and education.   Patient Verbal Consent Given: Yes Education Handout Provided: Yes Muscles Treated: bil cervical multifidi, bil upper traps, bil levator scap Electrical Stimulation Performed: No Treatment Response/Outcome: improved soft tissue mobility and decreased tender point size and number  01/20/2024 Initial evaluation & HEP created                                                                                                                                  PATIENT EDUCATION:  Education details: POC; DN handout & benefits; HEP; explanation of Xray results Person educated: Patient Education method: Explanation, Demonstration, and Handouts Education comprehension: verbalized understanding, returned demonstration, and needs further education  HOME EXERCISE PROGRAM: Access Code: [email or web address] URL: [email or web address] Date: 01/22/2024 Prepared by: Lavinia Sharps  Exercises - Seated Upper Trapezius Stretch  - 1 x daily - 7 x weekly - 1 sets - 20-30 hold - Seated Levator Scapulae Stretch  - 1 x daily - 7 x weekly - 1 sets - 3 reps - 20-30 hold - Seated Scapular Retraction  - 1 x daily - 7 x weekly - 1 sets - 10 reps - Seated Cervical Retraction  - 1 x daily - 7 x weekly - 1 sets - 10 reps - Doorway Pec Stretch at 90 Degrees Abduction  -  1 x daily - 7 x weekly - 1 sets - 3 reps - 20-30 hold - Standing Low Shoulder Row with Anchored Resistance  - 1 x daily - 7 x weekly - 1 sets - 10 reps - Shoulder Extension with Resistance Hands Down  - 1 x daily - 7 x weekly - 1 sets - 10 reps   ASSESSMENT:  CLINICAL IMPRESSION: Pt with improving symptoms overall. Continued to progress her nerve stretches, open up pecs and strengthen posterior shoulder girdle.    OBJECTIVE IMPAIRMENTS: decreased ROM, decreased strength,  increased muscle spasms, impaired flexibility, postural dysfunction, and pain.    GOALS: Goals reviewed with patient? Yes  SHORT TERM GOALS: Target date: 02/17/2024  Patient will be independent with initial HEP. Baseline:  Goal status: INITIAL  2.  Patient will report > or = to 30% improvement in symptoms since starting PT. Baseline:  Goal status: INITIAL  3.  Patient will demonstrate correct seated posture. Baseline:  Goal status: INITIAL   LONG TERM GOALS: Target date: 03/16/2024  Patient will demonstrate independence in advanced HEP. Baseline:  Goal status: INITIAL  2.  Patient will report > or = to 70% improvement in symptoms since starting PT. Baseline:  Goal status: INITIAL  3.  Patient will verbalize and demonstrate self-care strategies to manage pain including tissue mobility practices and change of position. Baseline:  Goal status: INITIAL   4.  Patient will be able to drive and look in her blind spot with < or = to 2/10 neck pain. Baseline:  Goal status: INITIAL  5.  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 Baseline:  Goal status: INITIAL     PLAN:  PT FREQUENCY: 1-2x/week  PT DURATION: 8 weeks  PLANNED INTERVENTIONS: 97164- PT Re-evaluation, 97110-Therapeutic exercises, 97530- Therapeutic activity, 97112- Neuromuscular re-education, 97535- Self Care, 28413- Manual therapy, [phone]- Canalith repositioning, [id number]- Aquatic Therapy, U2725- Electrical stimulation (unattended), [id number]- Electrical stimulation (manual), [id number]- Vasopneumatic device, [id number]- Ultrasound, [id number]- Traction (mechanical), [id number]- Ionotophoresis 4mg /ml Dexamethasone, Patient/Family education, Taping, Dry Needling, Joint mobilization, Joint manipulation, Spinal manipulation, Spinal mobilization, Vestibular training, Cryotherapy, and Moist heat  PLAN FOR NEXT SESSION: Review HEP;  TPDN as indicated, nerve glides; postural strengthening; MELT  method on foam roll   Janat Tabbert April Dell Ponto, PT, DPT 02/03/24 7:50 AM Phone: [phone] Fax: [phone]

## 2024-02-05 ENCOUNTER — Ambulatory Visit: Admitting: Physical Therapy

## 2024-02-05 DIAGNOSIS — K219 Gastro-esophageal reflux disease without esophagitis: Secondary | ICD-10-CM | POA: Diagnosis not present

## 2024-02-05 DIAGNOSIS — R293 Abnormal posture: Secondary | ICD-10-CM

## 2024-02-05 DIAGNOSIS — R490 Dysphonia: Secondary | ICD-10-CM | POA: Diagnosis not present

## 2024-02-05 DIAGNOSIS — M9979 Connective tissue and disc stenosis of intervertebral foramina of abdomen and other regions: Secondary | ICD-10-CM | POA: Diagnosis not present

## 2024-02-05 DIAGNOSIS — M6281 Muscle weakness (generalized): Secondary | ICD-10-CM

## 2024-02-05 DIAGNOSIS — M542 Cervicalgia: Secondary | ICD-10-CM

## 2024-02-05 DIAGNOSIS — G8929 Other chronic pain: Secondary | ICD-10-CM

## 2024-02-05 DIAGNOSIS — M5030 Other cervical disc degeneration, unspecified cervical region: Secondary | ICD-10-CM | POA: Diagnosis not present

## 2024-02-05 DIAGNOSIS — R1310 Dysphagia, unspecified: Secondary | ICD-10-CM | POA: Diagnosis not present

## 2024-02-05 NOTE — Therapy (Signed)
 OUTPATIENT PHYSICAL THERAPY CERVICAL PROGRESS NOTE   Patient Name: Maria Goodwin MRN: [id number] DOB:[birth year], 43 y.o., female Today's Date: 02/05/2024  END OF SESSION:  PT End of Session - 02/05/24 0934     Visit Number 5    Date for PT Re-Evaluation 03/16/24    Authorization Type [employer] (50vl)    PT Start Time 0932    PT Stop Time 1015    PT Time Calculation (min) 43 min    Activity Tolerance Patient tolerated treatment well                Past Medical History:  Diagnosis Date   Allergic reaction 02/23/2020   Itchy mouth with watermelon and cucumber.  Refer to allergist.   Allergic rhinitis    Allergy    Anemia 2021   Anxiety Since childhood   Blood in stool 2010   Patient reports hospitalization for blood in stool, diarrhea and extreme dehydration.  She was hospitalized at this time and a colonoscopy was completed.   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   Frequent headaches    GERD (gastroesophageal reflux disease)    Heart murmur    Hypertension    Iron deficiency anemia    Irregular menstrual bleeding 2015   Numbness and tingling of upper extremity 02/23/2020   Osteoarthritis of knee 2016   Other allergic rhinitis 03/23/2020   Patellofemoral pain syndrome 04/2015   Tinea corporis    Tonsil stone 2016   Vitamin D deficiency    Past Surgical History:  Procedure Laterality Date   BREAST BIOPSY Left 08/2016   fibroadenoma   BREAST BIOPSY Left 01/15/2021   BREAST BIOPSY Left 2023   CESAREAN SECTION  2012   WISDOM TOOTH EXTRACTION     Patient Active Problem List   Diagnosis Date Noted   GERD (gastroesophageal reflux disease) 01/14/2024   Encounter for breast cancer screening other than mammogram 01/14/2024   Neck pain 01/14/2024   Iron deficiency 07/30/2023   Pharyngoesophageal dysphagia 07/30/2023   Vitamin D deficiency 10/15/2021   Obstructive sleep apnea  05/18/2021   Abnormal mammogram 12/28/2020   Palpitations 06/13/2020   Severe menstrual cramps 06/13/2020   Menorrhagia with regular cycle 06/13/2020   Pollen-food allergy 03/23/2020   Hypertension 02/22/2020   E66.9 (BMI 30-39.9) 02/22/2020    PCP: Natalia Leatherwood, DO  REFERRING PROVIDER: Natalia Leatherwood, DO  REFERRING DIAG: M50.30 (ICD-10-CM) - DDD (degenerative disc disease), cervical M99.79 (ICD-10-CM) - Narrowing of intervertebral disc space M54.2 (ICD-10-CM) - Neck pain  THERAPY DIAG:  Cervicalgia  Chronic right shoulder pain  Abnormal posture  Muscle weakness (generalized)  Rationale for Evaluation and Treatment: Rehabilitation  ONSET DATE: Chronic  SUBJECTIVE:  SUBJECTIVE STATEMENT: "There's a little bit of pain in the usual spot." No N/T. Just a little sore in the back of the arm.   EVAL: Patient presents with chronic neck pain that she has had majority of her adult life. She has right arm pain that affects her ROM and function. She occasionally has numbness and tingling in her right arm but it is not consistent. 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 Neck pain changes and it feels very tight. When driving in the car she feels the most neck pain. It feels tight when she turns to look in her blind spots.  Sleep is not affected by her neck pain. She has headaches 1-2x week. Hand dominance: Right  PERTINENT HISTORY:  Anemia; anxiety; HTN; OA right knee  PAIN:  Are you having pain? Yes: NPRS scale: 0, not enough where I need to take something Pain location: C- spine bilateral Lt> Rt  Pain description: tight; uncomfortable Aggravating factors: driving in the car Relieving factors: stretching  PRECAUTIONS: None  RED  FLAGS: None     WEIGHT BEARING RESTRICTIONS: No  FALLS:  Has patient fallen in last 6 months? Yes. Number of falls fell walking in the woods. Patient verbalized she does not feel her balance is an issue.  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  DIAGNOSTIC FINDINGS:  IMPRESSION: Multilevel degenerative disc disease with severe narrowing of the C4-C5 disc space.  PATIENT SURVEYS:  NDI 8/50 16%  COGNITION: Overall cognitive status: Within functional limits for tasks assessed  SENSATION: Occasional numbness and tingling in Rt arm  POSTURE: rounded shoulders and forward head  PALPATION: Increased muscle spasms of bilateral upper trap, suboccipitals, and cervical paraspinals  Decreased upper T spine mobility joint mobs Grade III   CERVICAL ROM:   Active ROM A/PROM (deg) eval  Flexion 60  Extension 30  Right lateral flexion 32  Left lateral flexion 43  Right rotation 80  Left rotation 85   (Blank rows = not tested)  UPPER EXTREMITY ROM: WFL bilateral     UPPER EXTREMITY MMT:  MMT Right eval Left eval  Shoulder flexion 4 4+  Shoulder extension    Shoulder abduction 4+ 4+  Shoulder adduction    Shoulder extension    Shoulder internal rotation    Shoulder external rotation    Middle trapezius    Lower trapezius    Elbow flexion 5 5  Elbow extension 5 5  Wrist flexion    Wrist extension    Wrist ulnar deviation    Wrist radial deviation    Wrist pronation    Wrist supination    Grip  strength     (Blank rows = not tested)    FUNCTIONAL TESTS:  5 times sit to stand: 11.92 sec no hands  TREATMENT DATE:  02/05/24 UBE L2; 2 min fwd, 2 min bwd Doorway pec stretch, low, mid, high 2x30 sec each Supine  Shoulder ER at 90 deg abd x10  "W" x10  Shoulder abd x10  Manual therapy  Skilled assessment and palpation for TPDN  STM & TPR R pec minor/major, teres minor/major, lats, subscap Trigger Point Dry Needling  Subsequent Treatment: Instructions provided previously at initial dry needling treatment.   Patient Verbal Consent Given: Yes Education Handout Provided: Previously Provided Muscles Treated: Pec minor, pec major, sub scap, teres/lats Electrical Stimulation Performed: No Treatment Response/Outcome: Twitch response, improved tissue extensibility  Standing shoulder ER reactive iso red TB 10x5" Standing shoulder IR reactive iso red TB 10x5" Ulnar nerve glide full range x5  02/03/24 Doorway pec stretch, low, mid, high 2x30 sec each Upper trap stretch x 30" Levator scap stretch x 30" Ulnar nerve glide in scaption x10 On foam roll:  Horizontal shoulder abd yellow TB 2x10  "W" yellow TB 2x10  Snow angel elbows bent 2x10  Serratus ant yellow TB 2x10  Hands behind head pec stretch 2x30" Self care: Self myofacial release/massage on foam roll, tennis ball for pecs   01/29/24 UBE L1; 3 min fwd, 2 min bwd Doorway pec stretch, low, mid, high x30 sec each Upper trap stretch x30" Levator scap stretch x 30" Cross body stretch x 30" "W" supine x10 Scap squeeze supine x10 Snow angel elbows bent x10, elbow straight x10 Horizontal shoulder abd supine red TB 2x10 Skilled palpation and assessment for TPDN STM & TPR bilat UTs, cervical paraspinals Trigger Point Dry Needling  Subsequent Treatment: Instructions provided previously at initial dry needling treatment.   Patient Verbal Consent Given: Yes Education Handout Provided: Previously Provided Muscles Treated:  bilat rhomboids, UTs, cervical paraspinals Electrical Stimulation Performed: No Treatment Response/Outcome: Twitch response, decreased muscle tension  Reviewed form for row and shoulder ext    01/22/24: Review of initial HEP, some adjustment on Medbridge app for number of reps/sets Green band standing rows (anchored on doorknob) 10x (added to HEP) Green band standing bil shoulder extension (on doorknob) 10x (added to HEP) Manual therapy: soft tissue mobilization to bil cervical musculature Trigger Point Dry Needling Initial Treatment: Pt instructed on Dry Needling rational, procedures, and possible side effects. Pt instructed to expect mild to moderate muscle soreness later in the day and/or into the next day.  Pt instructed in methods to reduce muscle soreness. Pt instructed to continue prescribed HEP. Patient verbalized understanding of these instructions and education.   Patient Verbal Consent Given: Yes Education Handout Provided: Yes Muscles Treated: bil cervical multifidi, bil upper traps, bil levator scap Electrical Stimulation Performed: No Treatment Response/Outcome: improved soft tissue mobility and decreased tender point size and number  01/20/2024 Initial evaluation & HEP created                                                                                                                                 PATIENT EDUCATION:  Education details: POC; DN handout & benefits; HEP; explanation of Xray results Person educated: Patient Education method: Explanation, Demonstration, and Handouts Education comprehension: verbalized understanding, returned demonstration, and needs further  education  HOME EXERCISE PROGRAM: Access Code: [email or web address] URL: [email or web address] Date: 01/22/2024 Prepared by: Lavinia Sharps  Exercises - Seated Upper Trapezius Stretch  - 1 x daily - 7 x weekly - 1 sets - 20-30 hold - Seated Levator Scapulae Stretch  - 1 x daily - 7 x weekly -  1 sets - 3 reps - 20-30 hold - Seated Scapular Retraction  - 1 x daily - 7 x weekly - 1 sets - 10 reps - Seated Cervical Retraction  - 1 x daily - 7 x weekly - 1 sets - 10 reps - Doorway Pec Stretch at 90 Degrees Abduction  - 1 x daily - 7 x weekly - 1 sets - 3 reps - 20-30 hold - Standing Low Shoulder Row with Anchored Resistance  - 1 x daily - 7 x weekly - 1 sets - 10 reps - Shoulder Extension with Resistance Hands Down  - 1 x daily - 7 x weekly - 1 sets - 10 reps   ASSESSMENT:  CLINICAL IMPRESSION: Pt continues to improve in her shoulder motion with less posterior arm/nerve pain. Able to perform full ulnar nerve glides in full shoulder abduction without symptoms today after dry needling. Continued posterior shoulder girdle stabilization exercises.    OBJECTIVE IMPAIRMENTS: decreased ROM, decreased strength, increased muscle spasms, impaired flexibility, postural dysfunction, and pain.    GOALS: Goals reviewed with patient? Yes  SHORT TERM GOALS: Target date: 02/17/2024  Patient will be independent with initial HEP. Baseline:  Goal status: INITIAL  2.  Patient will report > or = to 30% improvement in symptoms since starting PT. Baseline:  Goal status: INITIAL  3.  Patient will demonstrate correct seated posture. Baseline:  Goal status: INITIAL   LONG TERM GOALS: Target date: 03/16/2024  Patient will demonstrate independence in advanced HEP. Baseline:  Goal status: INITIAL  2.  Patient will report > or = to 70% improvement in symptoms since starting PT. Baseline:  Goal status: INITIAL  3.  Patient will verbalize and demonstrate self-care strategies to manage pain including tissue mobility practices and change of position. Baseline:  Goal status: INITIAL   4.  Patient will be able to drive and look in her blind spot with < or = to 2/10 neck pain. Baseline:  Goal status: INITIAL  5.  Patient will be able to volunteer at dog shelter with < or = to 2/10 neck and  shoulder pain. Baseline:  Goal status: INITIAL     PLAN:  PT FREQUENCY: 1-2x/week  PT DURATION: 8 weeks  PLANNED INTERVENTIONS: [id number]- PT Re-evaluation, 97110-Therapeutic exercises, 97530- Therapeutic activity, 97112- Neuromuscular re-education, 97535- Self Care, [id number]- Manual therapy, [phone]- Canalith repositioning, [id number]- Aquatic Therapy, [id number]- Electrical stimulation (unattended), [phone]- Electrical stimulation (manual), [id number]- Vasopneumatic device, [id number]- Ultrasound, [id number]- Traction (mechanical), [id number]- Ionotophoresis 4mg /ml Dexamethasone, Patient/Family education, Taping, Dry Needling, Joint mobilization, Joint manipulation, Spinal manipulation, Spinal mobilization, Vestibular training, Cryotherapy, and Moist heat  PLAN FOR NEXT SESSION: Review HEP;  TPDN as indicated, nerve glides; postural strengthening; MELT method on foam roll   Carlisha Wisler April Dell Ponto, PT, DPT 02/05/24 9:34 AM Phone: [phone] Fax: [phone]

## 2024-02-09 ENCOUNTER — Ambulatory Visit: Admitting: Rehabilitative and Restorative Service Providers"

## 2024-02-10 DIAGNOSIS — J301 Allergic rhinitis due to pollen: Secondary | ICD-10-CM | POA: Diagnosis not present

## 2024-02-10 DIAGNOSIS — J3089 Other allergic rhinitis: Secondary | ICD-10-CM | POA: Diagnosis not present

## 2024-02-10 DIAGNOSIS — J3081 Allergic rhinitis due to animal (cat) (dog) hair and dander: Secondary | ICD-10-CM | POA: Diagnosis not present

## 2024-02-12 ENCOUNTER — Ambulatory Visit: Admitting: Physical Therapy

## 2024-02-12 DIAGNOSIS — G8929 Other chronic pain: Secondary | ICD-10-CM

## 2024-02-12 DIAGNOSIS — M542 Cervicalgia: Secondary | ICD-10-CM

## 2024-02-12 DIAGNOSIS — M9979 Connective tissue and disc stenosis of intervertebral foramina of abdomen and other regions: Secondary | ICD-10-CM | POA: Diagnosis not present

## 2024-02-12 DIAGNOSIS — K219 Gastro-esophageal reflux disease without esophagitis: Secondary | ICD-10-CM | POA: Diagnosis not present

## 2024-02-12 DIAGNOSIS — M5030 Other cervical disc degeneration, unspecified cervical region: Secondary | ICD-10-CM | POA: Diagnosis not present

## 2024-02-12 DIAGNOSIS — M6281 Muscle weakness (generalized): Secondary | ICD-10-CM

## 2024-02-12 DIAGNOSIS — R490 Dysphonia: Secondary | ICD-10-CM | POA: Diagnosis not present

## 2024-02-12 DIAGNOSIS — R293 Abnormal posture: Secondary | ICD-10-CM

## 2024-02-12 DIAGNOSIS — R1310 Dysphagia, unspecified: Secondary | ICD-10-CM | POA: Diagnosis not present

## 2024-02-12 NOTE — Therapy (Signed)
 OUTPATIENT PHYSICAL THERAPY CERVICAL PROGRESS NOTE   Patient Name: Maria Goodwin MRN: [id number] DOB:[birth year], 43 y.o., female Today's Date: 02/12/2024  END OF SESSION:  PT End of Session - 02/12/24 0922     Visit Number 6    Date for PT Re-Evaluation 03/16/24    Authorization Type [employer] (50vl)    PT Start Time [phone]    PT Stop Time 1010    PT Time Calculation (min) 45 min    Activity Tolerance Patient tolerated treatment well             Past Medical History:  Diagnosis Date   Allergic reaction 02/23/2020   Itchy mouth with watermelon and cucumber.  Refer to allergist.   Allergic rhinitis    Allergy    Anemia 2021   Anxiety Since childhood   Blood in stool 2010   Patient reports hospitalization for blood in stool, diarrhea and extreme dehydration.  She was hospitalized at this time and a colonoscopy was completed.   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   Frequent headaches    GERD (gastroesophageal reflux disease)    Heart murmur    Hypertension    Iron deficiency anemia    Irregular menstrual bleeding 2015   Numbness and tingling of upper extremity 02/23/2020   Osteoarthritis of knee 2016   Other allergic rhinitis 03/23/2020   Patellofemoral pain syndrome 04/2015   Tinea corporis    Tonsil stone 2016   Vitamin D deficiency    Past Surgical History:  Procedure Laterality Date   BREAST BIOPSY Left 08/2016   fibroadenoma   BREAST BIOPSY Left 01/15/2021   BREAST BIOPSY Left 2023   CESAREAN SECTION  2012   WISDOM TOOTH EXTRACTION     Patient Active Problem List   Diagnosis Date Noted   GERD (gastroesophageal reflux disease) 01/14/2024   Encounter for breast cancer screening other than mammogram 01/14/2024   Neck pain 01/14/2024   Iron deficiency 07/30/2023   Pharyngoesophageal dysphagia 07/30/2023   Vitamin D deficiency 10/15/2021   Obstructive sleep apnea 05/18/2021    Abnormal mammogram 12/28/2020   Palpitations 06/13/2020   Severe menstrual cramps 06/13/2020   Menorrhagia with regular cycle 06/13/2020   Pollen-food allergy 03/23/2020   Hypertension 02/22/2020   E66.9 (BMI 30-39.9) 02/22/2020    PCP: Natalia Leatherwood, DO  REFERRING PROVIDER: Natalia Leatherwood, DO  REFERRING DIAG: M50.30 (ICD-10-CM) - DDD (degenerative disc disease), cervical M99.79 (ICD-10-CM) - Narrowing of intervertebral disc space M54.2 (ICD-10-CM) - Neck pain  THERAPY DIAG:  Cervicalgia  Chronic right shoulder pain  Abnormal posture  Muscle weakness (generalized)  Rationale for Evaluation and Treatment: Rehabilitation  ONSET DATE: Chronic  SUBJECTIVE:  SUBJECTIVE STATEMENT: Pt states that her neck is feeling really tight this morning (no pain). Has continued to do her stretches. Not sure why it's tight. Pt reports R arm is doing good though -- no N/T, able to do more with it. Just one incident where she reached back and felt a sharp pain. Does have a headache this morning but not sure if it's related or allergies.   EVAL: Patient presents with chronic neck pain that she has had majority of her adult life. She has right arm pain that affects her ROM and function. She occasionally has numbness and tingling in her right arm but it is not consistent. 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 Neck pain changes and it feels very tight. When driving in the car she feels the most neck pain. It feels tight when she turns to look in her blind spots.  Sleep is not affected by her neck pain. She has headaches 1-2x week. Hand dominance: Right  PERTINENT HISTORY:  Anemia; anxiety; HTN; OA right knee  PAIN:  Are you having pain? Yes: NPRS scale: 0,  not enough where I need to take something Pain location: C- spine bilateral Lt> Rt  Pain description: tight; uncomfortable Aggravating factors: driving in the car Relieving factors: stretching  PRECAUTIONS: None  RED FLAGS: None     WEIGHT BEARING RESTRICTIONS: No  FALLS:  Has patient fallen in last 6 months? Yes. Number of falls fell walking in the woods. Patient verbalized she does not feel her balance is an issue.  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  DIAGNOSTIC FINDINGS:  IMPRESSION: Multilevel degenerative disc disease with severe narrowing of the C4-C5 disc space.  PATIENT SURVEYS:  NDI 8/50 16%  SENSATION: Occasional numbness and tingling in Rt arm  POSTURE: rounded shoulders and forward head  PALPATION: Increased muscle spasms of bilateral upper trap, suboccipitals, and cervical paraspinals  Decreased upper T spine mobility joint mobs Grade III   CERVICAL ROM:   Active ROM A/PROM (deg) eval  Flexion 60  Extension 30  Right lateral flexion 32  Left lateral flexion 43  Right rotation 80  Left rotation 85   (Blank rows = not tested)  UPPER EXTREMITY ROM: WFL bilateral    UPPER EXTREMITY MMT:  MMT Right eval Left eval  Shoulder flexion 4 4+  Shoulder extension    Shoulder abduction 4+ 4+  Shoulder adduction    Shoulder extension    Shoulder internal rotation    Shoulder external rotation    Middle trapezius    Lower trapezius    Elbow flexion 5 5  Elbow extension 5 5   Wrist flexion    Wrist extension    Wrist ulnar deviation    Wrist radial deviation    Wrist pronation    Wrist supination    Grip strength     (Blank rows = not tested)    FUNCTIONAL TESTS:  5 times sit to stand: 11.92 sec no hands  TREATMENT DATE:  02/12/24  UBE L2; 3 min fwd, 3 min bwd (R UE started to fall asleep with bwd) Doorway pec stretch low, mid, high 2x30 sec each UT stretch x 30" Levator scap stretch x 30" Manual therapy  Skilled assessment and palpation for TPDN  STM & TPR bilat UT, cervical paraspinals, suboccipitals Trigger Point Dry Needling  Subsequent Treatment: Instructions provided previously at initial dry needling treatment.   Patient Verbal Consent Given: Yes Education Handout Provided: Previously Provided Muscles Treated: bilat UT, cervical paraspinals, suboccipitals Electrical Stimulation Performed: No Treatment Response/Outcome: Twitch response, improved tissue extensibility  Prone  Cervical retraction 10x3"  Cervical retraction + rotation x10  Cervical retraction + "I" and then "W" x10  "Y" with PT assist to keep from UT compensation Quadruped  Row + cervical retraction 6# 2x10   02/05/24 UBE L2; 2 min fwd, 2 min bwd Doorway pec stretch, low, mid, high 2x30 sec each Supine  Shoulder ER at 90 deg abd x10  "W" x10  Shoulder abd x10 Manual therapy  Skilled assessment and palpation for TPDN  STM & TPR R pec minor/major, teres minor/major, lats, subscap Trigger Point Dry Needling  Subsequent Treatment: Instructions provided previously at initial dry needling treatment.   Patient Verbal Consent Given: Yes Education Handout Provided: Previously Provided Muscles Treated: Pec minor, pec major, sub scap, teres/lats Electrical Stimulation Performed: No Treatment Response/Outcome: Twitch response, improved tissue extensibility  Standing shoulder ER reactive iso red TB 10x5" Standing shoulder IR reactive iso red TB 10x5" Ulnar nerve glide  full range x5  02/03/24 Doorway pec stretch, low, mid, high 2x30 sec each Upper trap stretch x 30" Levator scap stretch x 30" Ulnar nerve glide in scaption x10 On foam roll:  Horizontal shoulder abd yellow TB 2x10  "W" yellow TB 2x10  Snow angel elbows bent 2x10  Serratus ant yellow TB 2x10  Hands behind head pec stretch 2x30" Self care: Self myofacial release/massage on foam roll, tennis ball for pecs   01/29/24 UBE L1; 3 min fwd, 2 min bwd Doorway pec stretch, low, mid, high x30 sec each Upper trap stretch x30" Levator scap stretch x 30" Cross body stretch x 30" "W" supine x10 Scap squeeze supine x10 Snow angel elbows bent x10, elbow straight x10 Horizontal shoulder abd supine red TB 2x10 Skilled palpation and assessment for TPDN STM & TPR bilat UTs, cervical paraspinals Trigger Point Dry Needling  Subsequent Treatment: Instructions provided previously at initial dry needling treatment.   Patient Verbal Consent Given: Yes Education Handout Provided: Previously Provided Muscles Treated: bilat rhomboids, UTs, cervical paraspinals Electrical Stimulation Performed: No Treatment Response/Outcome: Twitch response, decreased muscle tension  Reviewed form for row and shoulder ext    01/22/24: Review of initial HEP, some adjustment on Medbridge app for number of reps/sets Green band standing rows (anchored on doorknob) 10x (added to HEP) Green band standing bil shoulder extension (on doorknob) 10x (added to HEP) Manual therapy: soft tissue mobilization to bil cervical musculature Trigger Point Dry Needling Initial Treatment: Pt instructed on Dry Needling rational, procedures, and possible side effects. Pt instructed to expect mild to moderate muscle soreness later in the day and/or into the next day.  Pt instructed in methods to reduce muscle soreness. Pt instructed to continue prescribed HEP. Patient verbalized understanding of these instructions and education.   Patient  Verbal Consent Given: Yes Education Handout Provided: Yes Muscles Treated: bil cervical multifidi, bil upper traps, bil levator scap Electrical Stimulation Performed: No Treatment Response/Outcome: improved soft tissue mobility and decreased  tender point size and number  01/20/2024 Initial evaluation & HEP created                                                                                                                                 PATIENT EDUCATION:  Education details: POC; DN handout & benefits; HEP; explanation of Xray results Person educated: Patient Education method: Explanation, Demonstration, and Handouts Education comprehension: verbalized understanding, returned demonstration, and needs further education  HOME EXERCISE PROGRAM: Access Code: [email or web address] URL: [email or web address] Date: 01/22/2024 Prepared by: Lavinia Sharps  Exercises - Seated Upper Trapezius Stretch  - 1 x daily - 7 x weekly - 1 sets - 20-30 hold - Seated Levator Scapulae Stretch  - 1 x daily - 7 x weekly - 1 sets - 3 reps - 20-30 hold - Seated Scapular Retraction  - 1 x daily - 7 x weekly - 1 sets - 10 reps - Seated Cervical Retraction  - 1 x daily - 7 x weekly - 1 sets - 10 reps - Doorway Pec Stretch at 90 Degrees Abduction  - 1 x daily - 7 x weekly - 1 sets - 3 reps - 20-30 hold - Standing Low Shoulder Row with Anchored Resistance  - 1 x daily - 7 x weekly - 1 sets - 10 reps - Shoulder Extension with Resistance Hands Down  - 1 x daily - 7 x weekly - 1 sets - 10 reps   ASSESSMENT:  CLINICAL IMPRESSION: Decreased N/T in R UE with 90 deg doorway pec stretch after neck and UT dry needling today. Pt reports feeling better post session than when she came in. Worked on improving deep neck muscle strength and endurance to keep from pt's forward head posture. Continued working on posterior shoulder strengthening. Had a lot of difficulty activating low trap in prone without UT compensation and  will need further practice and education. Otherwise, pt with improving overall R UE movement with less pain and N/T and pt is reporting improvements with moving her head for driving.   OBJECTIVE IMPAIRMENTS: decreased ROM, decreased strength, increased muscle spasms, impaired flexibility, postural dysfunction, and pain.    GOALS: Goals reviewed with patient? Yes  SHORT TERM GOALS: Target date: 02/17/2024  Patient will be independent with initial HEP. Baseline:  Goal status: INITIAL  2.  Patient will report > or = to 30% improvement in symptoms since starting PT. Baseline:  Goal status: INITIAL  3.  Patient will demonstrate correct seated posture. Baseline:  Goal status: INITIAL   LONG TERM GOALS: Target date: 03/16/2024  Patient will demonstrate independence in advanced HEP. Baseline:  Goal status: INITIAL  2.  Patient will report > or = to 70% improvement in symptoms since starting PT. Baseline:  Goal status: INITIAL  3.  Patient will verbalize and demonstrate self-care strategies to manage pain including tissue mobility practices and change of position. Baseline:  Goal status: INITIAL   4.  Patient will be able to drive and look in her blind spot with < or = to 2/10 neck pain. Baseline:  Goal status: INITIAL  5.  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 Baseline:  Goal status: INITIAL     PLAN:  PT FREQUENCY: 1-2x/week  PT DURATION: 8 weeks  PLANNED INTERVENTIONS: 97164- PT Re-evaluation, 97110-Therapeutic exercises, 97530- Therapeutic activity, [id number]- Neuromuscular re-education, 97535- Self Care, 62952- Manual therapy, [id number]- Canalith repositioning, [id number]- Aquatic Therapy, [id number]- Electrical stimulation (unattended), [id number]- Electrical stimulation (manual), [id number]- Vasopneumatic device, [id number]- Ultrasound, [id number]- Traction (mechanical), [id number]- Ionotophoresis 4mg /ml Dexamethasone, Patient/Family education, Taping,  Dry Needling, Joint mobilization, Joint manipulation, Spinal manipulation, Spinal mobilization, Vestibular training, Cryotherapy, and Moist heat  PLAN FOR NEXT SESSION: Review HEP;  TPDN as indicated, Stretch pecs and UTs. Nerve glides; postural strengthening -- especially mid and low traps.   [facility] April Dell Ponto, PT, DPT 02/12/24 9:22 AM Phone: [phone] Fax: [phone]

## 2024-02-16 ENCOUNTER — Ambulatory Visit: Admitting: Rehabilitative and Restorative Service Providers"

## 2024-02-17 ENCOUNTER — Encounter: Payer: Self-pay | Admitting: Physician Assistant

## 2024-02-17 ENCOUNTER — Encounter: Payer: Self-pay | Admitting: Gastroenterology

## 2024-02-17 ENCOUNTER — Ambulatory Visit: Payer: Federal, State, Local not specified - PPO | Admitting: Physician Assistant

## 2024-02-17 VITALS — BP 118/74 | HR 84 | Ht 62.0 in | Wt 170.4 lb

## 2024-02-17 DIAGNOSIS — R1314 Dysphagia, pharyngoesophageal phase: Secondary | ICD-10-CM

## 2024-02-17 DIAGNOSIS — R1310 Dysphagia, unspecified: Secondary | ICD-10-CM

## 2024-02-17 DIAGNOSIS — K219 Gastro-esophageal reflux disease without esophagitis: Secondary | ICD-10-CM

## 2024-02-17 DIAGNOSIS — Z1211 Encounter for screening for malignant neoplasm of colon: Secondary | ICD-10-CM

## 2024-02-17 NOTE — Patient Instructions (Signed)
 It has been recommended to you by your physician that you have a(n) EGD/Colonoscopy completed. Per your request, we did not schedule the procedure(s) today. Please contact our office at [phone] should you decide to have the procedure completed. You will be scheduled for a pre-visit and procedure at that time.  _______________________________________________________  If your blood pressure at your visit was 140/90 or greater, please contact your primary care physician to follow up on this.  _______________________________________________________  If you are age 60 or older, your body mass index should be between 23-30. Your Body mass index is 31.16 kg/m. If this is out of the aforementioned range listed, please consider follow up with your Primary Care Provider.  If you are age 74 or younger, your body mass index should be between 19-25. Your Body mass index is 31.16 kg/m. If this is out of the aformentioned range listed, please consider follow up with your Primary Care Provider.   ________________________________________________________  The [facility] providers would like to encourage you to use [facility] to communicate with providers for non-urgent requests or questions.  Due to long hold times on the telephone, sending your provider a message by [facility] may be a faster and more efficient way to get a response.  Please allow 48 business hours for a response.  Please remember that this is for non-urgent requests.  _______________________________________________________

## 2024-02-17 NOTE — Progress Notes (Addendum)
 Chief Complaint: GERD and dysphagia, Discuss Colonoscopy  HPI:    Maria Goodwin is a 43 year old female with a past medical history as listed below, who was referred to me by Felix Pacini A, DO for a complaint of GERD, dysphagia and discuss a colonoscopy.     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    10/22/2023 modified barium swallow noted to have a mild oropharyngeal dysphagia.  Some of this thought related to early anatomical changes on the cervical spine.  That point recommended follow-up with speech path.    01/14/2024 cervical spine x-ray with multilevel degenerative disc disease with severe narrowing of the C4-C5 disc base.    01/14/2024 CBC with platelets of 497 and otherwise normal, CMP, hemoglobin A1c and TSH all normal.    Today, the patient presents to clinic and tells me that for the past at least 5 to 6 months she has had a workup of dysphagia and reflux.  She initially had swallow studies and was told it was a mixture of postnasal drip and reflux as well as maybe some anatomical variations.  Started on Omeprazole 40 mg around January timeframe and since then has had no further reflux symptoms and in fact her dysphagia is almost completely better.  Occasionally food still feels like it gets caught and is not go down easily.  She is also using "reflux Gourmet" after meals that may cause more reflux symptoms.    She describes a prior colonoscopy 14 years ago done for some diarrhea illness that she had.  It was normal and the diarrhea went away at the time.  She is now due for screening coming up at the age of 1 and wants to know if she should do a Cologuard or colonoscopy.    Denies fever, chills, weight loss, blood in her stool, nausea, vomiting, family history of colon cancer symptoms that awaken her from sleep.  Past Medical History:  Diagnosis Date    Allergic reaction 02/23/2020   Itchy mouth with watermelon and cucumber.  Refer to allergist.   Allergic rhinitis    Allergy    Anemia 2021   Anxiety Since childhood   Blood in stool 2010   Patient reports hospitalization for blood in stool, diarrhea and extreme dehydration.  She was hospitalized at this time and a colonoscopy was completed.   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   Frequent headaches    GERD (gastroesophageal reflux disease)    Heart murmur    Hypertension    Iron deficiency anemia    Irregular menstrual bleeding 2015   Numbness and tingling of upper extremity 02/23/2020   Osteoarthritis of knee 2016   Other allergic rhinitis 03/23/2020   Patellofemoral pain syndrome 04/2015   Tinea corporis    Tonsil stone 2016   Vitamin D deficiency     Past Surgical History:  Procedure Laterality Date   BREAST BIOPSY Left 08/2016   fibroadenoma   BREAST BIOPSY Left 01/15/2021   BREAST BIOPSY Left 2023   CESAREAN SECTION  2012   WISDOM TOOTH EXTRACTION      Current Outpatient Medications  Medication Sig Dispense Refill   amLODipine (NORVASC) 10 MG tablet Take 1 tablet (10 mg total) by mouth daily. 90 tablet 1   EPINEPHrine 0.3 mg/0.3 mL IJ SOAJ  injection See admin instructions.     Fexofenadine-Pseudoephedrine (ALLEGRA-D 24 HOUR PO) Take by mouth.     fluticasone (FLONASE) 50 MCG/ACT nasal spray Place 2 sprays into both nostrils in the morning and at bedtime. 16 g 6   Multiple Vitamins-Minerals (MULTI COMPLETE PO) Take by mouth.     naproxen (NAPROSYN) 500 MG tablet Take 1 tablet (500 mg total) by mouth 2 (two) times daily as needed for mild pain. 60 tablet 4   omeprazole (PRILOSEC) 40 MG capsule Take 1 capsule (40 mg total) by mouth daily. 90 capsule 1   olopatadine (PATANOL) 0.1 % ophthalmic solution 1 drop 2 (two) times daily. (Patient not taking: Reported on 02/17/2024)     No current  facility-administered medications for this visit.    Allergies as of 02/17/2024   (No Known Allergies)    Family History  Problem Relation Age of Onset   Arthritis Mother    Hyperlipidemia Mother    Hypertension Mother    Learning disabilities Mother    Allergic rhinitis Mother    ALS Mother    Alcohol abuse Father    Depression Father    Vision loss Father    Asthma Brother    Hyperlipidemia Brother    Hypertension Brother    Allergic rhinitis Brother    Asthma Brother    Hyperlipidemia Brother    Hypertension Brother    Allergic rhinitis Brother    Bladder Cancer Maternal Grandmother    Early death Maternal Grandmother    Skin cancer Maternal Grandfather    Early death Maternal Grandfather    Stroke Maternal Grandfather    Hypertension Maternal Grandfather    Skin cancer Paternal Grandmother    Skin cancer Paternal Grandfather    Allergic rhinitis Son    Eczema Son    Urticaria Son    Breast cancer Neg Hx     Social History   Socioeconomic History   Marital status: Married    Spouse name: Not on file   Number of children: Not on file   Years of education: Not on file   Highest education level: 12th grade  Occupational History   Not on file  Tobacco Use   Smoking status: Never   Smokeless tobacco: Never  Vaping Use   Vaping status: Never Used  Substance and Sexual Activity   Alcohol use: Not Currently    Comment: socially    Drug use: Never   Sexual activity: Yes    Partners: Male    Birth control/protection: None, Surgical    Comment: husband vasectomy  Other Topics Concern   Not on file  Social History Narrative   Marital status/children/pets: Married, 1 child.   Education/employment: High school graduate, stay-at-home mom   Safety:      -Wears a bicycle helmet riding a bike: Yes     -smoke alarm in the home:Yes     - wears seatbelt: Yes     - Feels safe in their relationships: Yes   Social Drivers of Corporate investment banker Strain:  Low Risk  (01/13/2024)   Overall Financial Resource Strain (CARDIA)    Difficulty of Paying Living Expenses: Not very hard  Food Insecurity: No Food Insecurity (01/13/2024)   Hunger Vital Sign    Worried About Running Out of Food in the Last Year: Never true    Ran Out of Food in the Last Year: Never true  Transportation Needs: No Transportation Needs (01/13/2024)   PRAPARE - Transportation  Lack of Transportation (Medical): No    Lack of Transportation (Non-Medical): No  Physical Activity: Sufficiently Active (01/13/2024)   Exercise Vital Sign    Days of Exercise per Week: 4 days    Minutes of Exercise per Session: 60 min  Stress: No Stress Concern Present (01/13/2024)   [employer] of Occupational Health - Occupational Stress Questionnaire    Feeling of Stress : Not at all  Social Connections: Moderately Integrated (01/13/2024)   Social Connection and Isolation Panel [NHANES]    Frequency of Communication with Friends and Family: More than three times a week    Frequency of Social Gatherings with Friends and Family: Twice a week    Attends Religious Services: Never    Database administrator or Organizations: Yes    Attends Engineer, structural: More than 4 times per year    Marital Status: Married  Catering manager Violence: Not on file    Review of Systems:    Constitutional: No weight loss, fever or chills Skin: No rash  Cardiovascular: No chest pain  Respiratory: No SOB Gastrointestinal: See HPI and otherwise negative Genitourinary: No dysuria  Neurological: No headache, dizziness or syncope Musculoskeletal: No new muscle or joint pain Hematologic: No bleeding  Psychiatric: No history of depression or anxiety   Physical Exam:  Vital signs: BP 118/74   Pulse 84   Ht 5\' 2"  (1.575 m)   Wt 170 lb 6 oz (77.3 kg)   BMI 31.16 kg/m   Constitutional:   Pleasant overweight Caucasian female appears to be in NAD, Well developed, Well nourished, alert and  cooperative Head:  Normocephalic and atraumatic. Eyes:   PEERL, EOMI. No icterus. Conjunctiva pink. Ears:  Normal auditory acuity. Neck:  Supple Throat: Oral cavity and pharynx without inflammation, swelling or lesion.  Respiratory: Respirations even and unlabored. Lungs clear to auscultation bilaterally.   No wheezes, crackles, or rhonchi.  Cardiovascular: Normal S1, S2. No MRG. Regular rate and rhythm. No peripheral edema, cyanosis or pallor.  Gastrointestinal:  Soft, nondistended, nontender. No rebound or guarding. Normal bowel sounds. No appreciable masses or hepatomegaly. Rectal:  Not performed.  Msk:  Symmetrical without gross deformities. Without edema, no deformity or joint abnormality.  Neurologic:  Alert and  oriented x4;  grossly normal neurologically.  Skin:   Dry and intact without significant lesions or rashes. Psychiatric: Demonstrates good judgement and reason without abnormal affect or behaviors.  RELEVANT LABS AND IMAGING: CBC    Component Value Date/Time   WBC 9.2 01/14/2024 0755   RBC 4.16 01/14/2024 0755   HGB 12.3 01/14/2024 0755   HCT 37.1 01/14/2024 0755   PLT 497.0 (H) 01/14/2024 0755   MCV 89.3 01/14/2024 0755   MCH 30.0 06/04/2023 0905   MCHC 33.1 01/14/2024 0755   RDW 13.4 01/14/2024 0755   LYMPHSABS 2.2 12/20/2022 0932   MONOABS 0.5 12/20/2022 0932   EOSABS 0.2 12/20/2022 0932   BASOSABS 0.1 12/20/2022 0932    CMP     Component Value Date/Time   NA 136 01/14/2024 0755   K 4.3 01/14/2024 0755   CL 101 01/14/2024 0755   CO2 28 01/14/2024 0755   GLUCOSE 84 01/14/2024 0755   BUN 8 01/14/2024 0755   CREATININE 0.71 01/14/2024 0755   CREATININE 0.70 10/15/2021 1514   CALCIUM 9.0 01/14/2024 0755   PROT 7.1 01/14/2024 0755   ALBUMIN 4.2 01/14/2024 0755   AST 19 01/14/2024 0755   ALT 20 01/14/2024 0755   ALKPHOS 73  01/14/2024 0755   BILITOT 0.4 01/14/2024 0755    Assessment: 1.  Screening for colorectal cancer: Patient is going to be 43 at  the end of May and will need a colon cancer screening with colonoscopy 2.  GERD and dysphagia: Better on Omeprazole 40 mg daily over the past 4 months, no prior EGD, still with the residual dysphagia at times, swallow study with prominent cricopharyngeal bar and changes in the cervical spine which could be contributing  Plan: 1.  Patient will be scheduled for diagnostic EGD with possible dilation in the [facility] as well as screening colonoscopy.  These will be scheduled with Dr. Lavon Paganini.  Did provide the patient with a detailed list of risks for the procedures and she agrees to proceed. Patient is appropriate for endoscopic procedure(s) in the ambulatory ([facility]) setting.  Also went ahead and had the team discussed bowel prep with her.  She just did not have her schedule in front of her today she will call back to schedule. 2.  Continue Omeprazole 40 mg daily.  Discussed risk and  benefit profile of this medication.  In the future may try to titrate down/off. 3.  Patient to follow in clinic per recommendations after time of procedure.  Hyacinth Meeker, PA-C [facility] 02/17/2024, 11:01 AM  Cc: Felix Pacini A, DO

## 2024-02-18 DIAGNOSIS — J3081 Allergic rhinitis due to animal (cat) (dog) hair and dander: Secondary | ICD-10-CM | POA: Diagnosis not present

## 2024-02-18 DIAGNOSIS — J301 Allergic rhinitis due to pollen: Secondary | ICD-10-CM | POA: Diagnosis not present

## 2024-02-18 DIAGNOSIS — J3089 Other allergic rhinitis: Secondary | ICD-10-CM | POA: Diagnosis not present

## 2024-02-19 ENCOUNTER — Encounter: Payer: Self-pay | Admitting: Rehabilitative and Restorative Service Providers"

## 2024-02-19 ENCOUNTER — Ambulatory Visit: Attending: Family Medicine | Admitting: Rehabilitative and Restorative Service Providers"

## 2024-02-19 DIAGNOSIS — M542 Cervicalgia: Secondary | ICD-10-CM | POA: Diagnosis not present

## 2024-02-19 DIAGNOSIS — R293 Abnormal posture: Secondary | ICD-10-CM | POA: Insufficient documentation

## 2024-02-19 DIAGNOSIS — G8929 Other chronic pain: Secondary | ICD-10-CM | POA: Diagnosis not present

## 2024-02-19 DIAGNOSIS — M25511 Pain in right shoulder: Secondary | ICD-10-CM | POA: Diagnosis not present

## 2024-02-19 DIAGNOSIS — M6281 Muscle weakness (generalized): Secondary | ICD-10-CM | POA: Insufficient documentation

## 2024-02-19 NOTE — Therapy (Signed)
 OUTPATIENT PHYSICAL THERAPY CERVICAL PROGRESS NOTE   Patient Name: Maria Goodwin MRN: [id number] DOB:[birth year], 43 y.o., female Today's Date: 02/19/2024  END OF SESSION:  PT End of Session - 02/19/24 0845     Visit Number 7    Date for PT Re-Evaluation 03/16/24    Authorization Type [employer] (50vl)    PT Start Time [phone]    PT Stop Time 0930    PT Time Calculation (min) 46 min    Activity Tolerance Patient tolerated treatment well    Behavior During Therapy [facility] for tasks assessed/performed             Past Medical History:  Diagnosis Date   Allergic reaction 02/23/2020   Itchy mouth with watermelon and cucumber.  Refer to allergist.   Allergic rhinitis    Allergy    Anemia 2021   Anxiety Since childhood   Blood in stool 2010   Patient reports hospitalization for blood in stool, diarrhea and extreme dehydration.  She was hospitalized at this time and a colonoscopy was completed.   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   Frequent headaches    GERD (gastroesophageal reflux disease)    Heart murmur    Hypertension    Iron deficiency anemia    Irregular menstrual bleeding 2015   Numbness and tingling of upper extremity 02/23/2020   Osteoarthritis of knee 2016   Other allergic rhinitis 03/23/2020   Patellofemoral pain syndrome 04/2015   Tinea corporis    Tonsil stone 2016   Vitamin D deficiency    Past Surgical History:  Procedure Laterality Date   BREAST BIOPSY Left 08/2016   fibroadenoma   BREAST BIOPSY Left 01/15/2021   BREAST BIOPSY Left 2023   CESAREAN SECTION  2012   WISDOM TOOTH EXTRACTION     Patient Active Problem List   Diagnosis Date Noted   GERD (gastroesophageal reflux disease) 01/14/2024   Encounter for breast cancer screening other than mammogram 01/14/2024   Neck pain 01/14/2024   Iron deficiency 07/30/2023   Pharyngoesophageal dysphagia 07/30/2023   Vitamin D  deficiency 10/15/2021   Obstructive sleep apnea 05/18/2021   Abnormal mammogram 12/28/2020   Palpitations 06/13/2020   Severe menstrual cramps 06/13/2020   Menorrhagia with regular cycle 06/13/2020   Pollen-food allergy 03/23/2020   Hypertension 02/22/2020   E66.9 (BMI 30-39.9) 02/22/2020    PCP: Natalia Leatherwood, DO  REFERRING PROVIDER: Natalia Leatherwood, DO  REFERRING DIAG: M50.30 (ICD-10-CM) - DDD (degenerative disc disease), cervical M99.79 (ICD-10-CM) - Narrowing of intervertebral disc space M54.2 (ICD-10-CM) - Neck pain  THERAPY DIAG:  Cervicalgia  Chronic right shoulder pain  Abnormal posture  Muscle weakness (generalized)  Rationale for Evaluation and Treatment: Rehabilitation  ONSET DATE: Chronic  SUBJECTIVE:  SUBJECTIVE STATEMENT: Pt states that overall, she is feeling at least 60% better since starting PT.  Reports that the dry needling significantly helps lower her pain/tightness down to a 2/10 for a few days after.  Patient states that she is having a lot of pain/tightness this morning at 7/10.  EVAL: Patient presents with chronic neck pain that she has had majority of her adult life. She has right arm pain that affects her ROM and function. She occasionally has numbness and tingling in her right arm but it is not consistent. 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 Neck pain changes and it feels very tight. When driving in the car she feels the most neck pain. It feels tight when she turns to look in her blind spots.  Sleep is not affected by her neck pain. She has headaches 1-2x week. Hand dominance: Right  PERTINENT HISTORY:  Anemia; anxiety; HTN; OA right knee  PAIN:  Are you having pain? Yes: NPRS scale: 7/10 Pain  location: C- spine bilateral Lt> Rt  Pain description: tight; uncomfortable Aggravating factors: driving in the car Relieving factors: stretching  PRECAUTIONS: None  RED FLAGS: None     WEIGHT BEARING RESTRICTIONS: No  FALLS:  Has patient fallen in last 6 months? Yes. Number of falls fell walking in the woods. Patient verbalized she does not feel her balance is an issue.  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  DIAGNOSTIC FINDINGS:  IMPRESSION: Multilevel degenerative disc disease with severe narrowing of the C4-C5 disc space.  PATIENT SURVEYS:  Eval:  NDI 8/50 16% 02/19/2024:  Neck Disability Index score: 5 / 50 = 10.0 %  SENSATION: Occasional numbness and tingling in Rt arm  POSTURE: rounded shoulders and forward head  PALPATION: Increased muscle spasms of bilateral upper trap, suboccipitals, and cervical paraspinals  Decreased upper T spine mobility joint mobs Grade III   CERVICAL ROM:   Active ROM A/PROM (deg) eval  Flexion 60  Extension 30  Right lateral flexion 32  Left lateral flexion 43  Right rotation 80  Left rotation 85   (Blank rows = not tested)  UPPER EXTREMITY ROM: WFL bilateral    UPPER EXTREMITY MMT:  MMT Right eval Left eval  Shoulder flexion 4 4+  Shoulder extension    Shoulder abduction 4+ 4+  Shoulder adduction    Shoulder extension    Shoulder internal rotation    Shoulder external rotation    Middle trapezius    Lower trapezius    Elbow flexion 5 5  Elbow  extension 5 5  Wrist flexion    Wrist extension    Wrist ulnar deviation    Wrist radial deviation    Wrist pronation    Wrist supination    Grip strength     (Blank rows = not tested)    FUNCTIONAL TESTS:  Eval:  5 times sit to stand: 11.92 sec no hands  TREATMENT DATE:  02/19/2024 UBE level 2.0 x3 min each direction with PT present to  discuss status Neck Disability Index score: 5 / 50 = 10.0 % Supine MELT method with soft foam roll for cervical rotation and head nods x20 each Hooklying on soft foam roll in horizontal orientation:  chest/pec stretch x30 sec Hooklying on soft foam roll in horizontal orientation: shoulder ER with green tband 2x10 (cuing for core engagement) Hooklying on soft foam roll in horizontal orientation:  shoulder horizontal abduction with green tband 2x10 Seated cervical SNAGs for rotation x10 bilat Seated cervical SNAGs for extension x10 Seated upper extremity nerve glide x10 bilat Prone with 3# dumbbell: shoulder flexion, extension, and horizontal abduction.  X10 each bilat Soft tissue mobilization to bilateral cervical paraspinals, upper traps/rhomboids with suboccipital release and manual traction 3x10 sec.  Patient reported a decrease in pain with manual traction.   02/12/24 UBE L2; 3 min fwd, 3 min bwd (R UE started to fall asleep with bwd) Doorway pec stretch low, mid, high 2x30 sec each UT stretch x 30" Levator scap stretch x 30" Manual therapy  Skilled assessment and palpation for TPDN  STM & TPR bilat UT, cervical paraspinals, suboccipitals Trigger Point Dry Needling  Subsequent Treatment: Instructions provided previously at initial dry needling treatment.   Patient Verbal Consent Given: Yes Education Handout Provided: Previously Provided Muscles Treated: bilat UT, cervical paraspinals, suboccipitals Electrical Stimulation Performed: No Treatment Response/Outcome: Twitch response, improved tissue extensibility  Prone  Cervical retraction  10x3"  Cervical retraction + rotation x10  Cervical retraction + "I" and then "W" x10  "Y" with PT assist to keep from UT compensation Quadruped  Row + cervical retraction 6# 2x10   02/05/24 UBE L2; 2 min fwd, 2 min bwd Doorway pec stretch, low, mid, high 2x30 sec each Supine  Shoulder ER at 90 deg abd x10  "W" x10  Shoulder abd x10 Manual therapy  Skilled assessment and palpation for TPDN  STM & TPR R pec minor/major, teres minor/major, lats, subscap Trigger Point Dry Needling  Subsequent Treatment: Instructions provided previously at initial dry needling treatment.   Patient Verbal Consent Given: Yes Education Handout Provided: Previously Provided Muscles Treated: Pec minor, pec major, sub scap, teres/lats Electrical Stimulation Performed: No Treatment Response/Outcome: Twitch response, improved tissue extensibility  Standing shoulder ER reactive iso red TB 10x5" Standing shoulder IR reactive iso red TB 10x5" Ulnar nerve glide full range x5   PATIENT EDUCATION:  Education details: POC; DN handout & benefits; HEP; explanation of Xray results Person educated: Patient Education method: Explanation, Demonstration, and Handouts Education comprehension: verbalized understanding, returned demonstration, and needs further education  HOME EXERCISE PROGRAM: Access Code: [email or web address] URL: [email or web address] Date: 02/19/2024 Prepared by: Clydie Braun Curly Mackowski  Exercises - Seated Upper Trapezius Stretch  - 1 x daily - 7 x weekly - 2 sets - 20-30 hold - Seated Levator Scapulae Stretch  - 1 x daily - 7 x weekly - 1 sets - 2 reps - 20-30 hold - Seated Scapular Retraction  - 1 x daily - 7 x weekly - 2 sets - 10 reps - Seated Cervical Retraction  - 1 x daily - 7 x weekly - 2 sets - 10 reps - Doorway Pec Stretch at 90 Degrees Abduction  - 1 x daily - 7 x weekly - 1 sets - 2 reps - 20-30 hold - Standing Low Shoulder Row with Anchored Resistance  - 1 x daily - 7 x weekly - 2 sets  - 10 reps - Shoulder Extension with Resistance Hands Down  - 1 x daily - 7 x  weekly - 2 sets - 10 reps - Ulnar Nerve Glide- Full Arm  - 1 x daily - 7 x weekly - 2 sets - 10 reps - Shoulder W - External Rotation with Resistance  - 1 x daily - 7 x weekly - 2 sets - 10 reps - Isometric Standing Shoulder External Rotation in Abduction  - 1 x daily - 7 x weekly - 2 sets - 10 reps - Isometric Standing Shoulder Internal Rotation - 90 Degrees Abduction  - 1 x daily - 7 x weekly - 2 sets - 10 reps - Cervical Retraction Prone on Elbows  - 1 x daily - 7 x weekly - 2 sets - 10 reps - Seated Assisted Cervical Rotation with Towel  - 1 x daily - 7 x weekly - 1-2 sets - 10 reps - Upper Cervical Extension SNAG with Strap  - 1 x daily - 7 x weekly - 1-2 sets - 10 reps - Seated Upper Extremity Nerve Glide  - 1 x daily - 7 x weekly - 1-2 sets - 10 reps   ASSESSMENT:  CLINICAL IMPRESSION: Ms Kohlbeck presents to skilled PT presents to skilled PT reporting that the dry needling appears to help for a few days, but that she starts to have the tightness return after a few days.  Patient states that she was using the app for HEP, but had some difficulty due to it not allowing time for each side, so PT added in mirror image and adjusted reps appropriately.  Patient reports some decreased tightness with use of MELT method on soft foam roll.  Patient tolerated SNAGs well for cervical mobility/mobilization, so added those to HEP as well as another nerve glide.  Patient continues to have a slight rounded shoulder posture, so added more scapular stability/strengthening to assist with improved postural support.  Ended session with soft tissue mobilization and did not note significant trigger points by end of session, like palpated in previous sessions.  Patient educated on the importance of improving postural support to assist with improved alignment to decrease risk of impingement.  Additionally, patient with good response to manual  cervical traction at end of session.  May consider adding mechanical traction in future sessions.  OBJECTIVE IMPAIRMENTS: decreased ROM, decreased strength, increased muscle spasms, impaired flexibility, postural dysfunction, and pain.    GOALS: Goals reviewed with patient? Yes  SHORT TERM GOALS: Target date: 02/17/2024  Patient will be independent with initial HEP. Baseline:  Goal status: Met   2.  Patient will report > or = to 30% improvement in symptoms since starting PT. Baseline:  Goal status: Met on 02/19/2024  3.  Patient will demonstrate correct seated posture. Baseline:  Goal status: Partially Met (slight forward flexed posture)   LONG TERM GOALS: Target date: 03/16/2024  Patient will demonstrate independence in advanced HEP. Baseline:  Goal status: Ongoing  2.  Patient will report > or = to 70% improvement in symptoms since starting PT. Baseline:  Goal status: Ongoing (reported 60% improvement on 02/19/24)  3.  Patient will verbalize and demonstrate self-care strategies to manage pain including tissue mobility practices and change of position. Baseline:  Goal status: Ongoing   4.  Patient will be able to drive and look in her blind spot with < or = to 2/10 neck pain. Baseline:  Goal status: INITIAL  5.  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 Baseline:  Goal status:  INITIAL     PLAN:  PT FREQUENCY: 1-2x/week  PT DURATION: 8 weeks  PLANNED INTERVENTIONS: 97164- PT Re-evaluation, 97110-Therapeutic exercises, 97530- Therapeutic activity, [id number]- Neuromuscular re-education, 97535- Self Care, 16109- Manual therapy, [phone]- Canalith repositioning, [id number]- Aquatic Therapy, U9811- Electrical stimulation (unattended), [id number]- Electrical stimulation (manual), [id number]- Vasopneumatic device, [id number]- Ultrasound, [id number]- Traction (mechanical), [id number]- Ionotophoresis 4mg /ml Dexamethasone, Patient/Family education, Taping, Dry  Needling, Joint mobilization, Joint manipulation, Spinal manipulation, Spinal mobilization, Vestibular training, Cryotherapy, and Moist heat  PLAN FOR NEXT SESSION: Review HEP;  TPDN as indicated, Stretch pecs and UTs. Nerve glides; postural/scapular strengthening -- especially mid and low traps.  Possible addition of mechanical traction.   Reather Laurence, PT, DPT 02/19/24, 10:42 AM  [facility] [address] [place], [place] [address] Phone # [phone] Fax [phone]

## 2024-02-23 ENCOUNTER — Ambulatory Visit: Admitting: Rehabilitative and Restorative Service Providers"

## 2024-02-23 ENCOUNTER — Encounter: Payer: Self-pay | Admitting: Rehabilitative and Restorative Service Providers"

## 2024-02-23 DIAGNOSIS — M542 Cervicalgia: Secondary | ICD-10-CM | POA: Diagnosis not present

## 2024-02-23 DIAGNOSIS — R293 Abnormal posture: Secondary | ICD-10-CM | POA: Diagnosis not present

## 2024-02-23 DIAGNOSIS — M6281 Muscle weakness (generalized): Secondary | ICD-10-CM | POA: Diagnosis not present

## 2024-02-23 DIAGNOSIS — G8929 Other chronic pain: Secondary | ICD-10-CM | POA: Diagnosis not present

## 2024-02-23 DIAGNOSIS — M25511 Pain in right shoulder: Secondary | ICD-10-CM | POA: Diagnosis not present

## 2024-02-23 NOTE — Therapy (Signed)
 OUTPATIENT PHYSICAL THERAPY TREATMENT NOTE   Patient Name: Maria Goodwin MRN: [id number] DOB:1981-02-26, 43 y.o., female Today's Date: 02/23/2024  END OF SESSION:  PT End of Session - 02/23/24 0935     Visit Number 8    Date for PT Re-Evaluation 03/16/24    Authorization Type [employer] (50vl)    PT Start Time [phone]    PT Stop Time 1012    PT Time Calculation (min) 41 min    Activity Tolerance Patient tolerated treatment well    Behavior During Therapy [facility] for tasks assessed/performed             Past Medical History:  Diagnosis Date   Allergic reaction 02/23/2020   Itchy mouth with watermelon and cucumber.  Refer to allergist.   Allergic rhinitis    Allergy    Anemia 2021   Anxiety Since childhood   Blood in stool 2010   Patient reports hospitalization for blood in stool, diarrhea and extreme dehydration.  She was hospitalized at this time and a colonoscopy was completed.   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   Frequent headaches    GERD (gastroesophageal reflux disease)    Heart murmur    Hypertension    Iron deficiency anemia    Irregular menstrual bleeding 2015   Numbness and tingling of upper extremity 02/23/2020   Osteoarthritis of knee 2016   Other allergic rhinitis 03/23/2020   Patellofemoral pain syndrome 04/2015   Tinea corporis    Tonsil stone 2016   Vitamin D deficiency    Past Surgical History:  Procedure Laterality Date   BREAST BIOPSY Left 08/2016   fibroadenoma   BREAST BIOPSY Left 01/15/2021   BREAST BIOPSY Left 2023   CESAREAN SECTION  2012   WISDOM TOOTH EXTRACTION     Patient Active Problem List   Diagnosis Date Noted   GERD (gastroesophageal reflux disease) 01/14/2024   Encounter for breast cancer screening other than mammogram 01/14/2024   Neck pain 01/14/2024   Iron deficiency 07/30/2023   Pharyngoesophageal dysphagia 07/30/2023   Vitamin D deficiency  10/15/2021   Obstructive sleep apnea 05/18/2021   Abnormal mammogram 12/28/2020   Palpitations 06/13/2020   Severe menstrual cramps 06/13/2020   Menorrhagia with regular cycle 06/13/2020   Pollen-food allergy 03/23/2020   Hypertension 02/22/2020   E66.9 (BMI 30-39.9) 02/22/2020    PCP: Natalia Leatherwood, DO  REFERRING PROVIDER: Natalia Leatherwood, DO  REFERRING DIAG: M50.30 (ICD-10-CM) - DDD (degenerative disc disease), cervical M99.79 (ICD-10-CM) - Narrowing of intervertebral disc space M54.2 (ICD-10-CM) - Neck pain  THERAPY DIAG:  Cervicalgia  Chronic right shoulder pain  Abnormal posture  Muscle weakness (generalized)  Rationale for Evaluation and Treatment: Rehabilitation  ONSET DATE: Chronic  SUBJECTIVE:  SUBJECTIVE STATEMENT: Patient reports that she has been walking dogs over the weekend.  States that the new stretches do seem to be helping.  Hand dominance: Right  PERTINENT HISTORY:  Anemia; anxiety; HTN; OA right knee  PAIN:  Are you having pain? Yes: NPRS scale: 6/10 Pain location: C- spine bilateral Lt> Rt  Pain description: tight; uncomfortable Aggravating factors: driving in the car Relieving factors: stretching  PRECAUTIONS: None  RED FLAGS: None     WEIGHT BEARING RESTRICTIONS: No  FALLS:  Has patient fallen in last 6 months? Yes. Number of falls fell walking in the woods. Patient verbalized she does not feel her balance is an issue.  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  DIAGNOSTIC FINDINGS:  IMPRESSION: Multilevel degenerative disc disease with severe narrowing of the C4-C5 disc space.  PATIENT SURVEYS:  Eval:  NDI 8/50 16% 02/19/2024:  Neck Disability Index score: 5 / 50 = 10.0 %  SENSATION: Occasional numbness and tingling in Rt arm  POSTURE: rounded shoulders and forward head  PALPATION: Increased muscle spasms of bilateral upper trap, suboccipitals, and cervical paraspinals  Decreased upper T spine mobility joint mobs Grade III   CERVICAL ROM:   Active ROM A/PROM (deg) eval  Flexion 60  Extension 30  Right lateral flexion 32  Left lateral flexion 43  Right rotation 80  Left rotation 85   (Blank rows = not tested)  UPPER EXTREMITY ROM: WFL bilateral    UPPER EXTREMITY MMT:  MMT Right eval Left eval  Shoulder flexion 4 4+  Shoulder extension    Shoulder abduction 4+ 4+  Shoulder adduction    Shoulder extension    Shoulder internal rotation    Shoulder external rotation    Middle trapezius    Lower trapezius    Elbow flexion 5 5  Elbow extension 5 5  Wrist flexion    Wrist extension    Wrist ulnar deviation    Wrist radial deviation    Wrist pronation    Wrist supination    Grip strength     (Blank rows = not tested)    FUNCTIONAL TESTS:  Eval:  5 times sit to stand: 11.92 sec no hands  TREATMENT DATE:  02/23/2024: UBE level 2.0 x3 min each direction with PT present to discuss status Supine MELT method with soft foam roll for cervical rotation and head nods x20 each Hooklying on soft foam roll in horizontal orientation:  chest/pec stretch x30 sec Hooklying on soft foam roll in horizontal orientation: shoulder ER with green tband 2x10 (cuing for core engagement) Hooklying on soft foam roll in horizontal  orientation:  shoulder horizontal abduction with green tband 2x10 Quadruped reach under 2x10 bilat Prone alt UE/LE extension x10 bilat Standing shoulder extension with green tband 2x10 Standing shoulder rows with green tband 2x10 Trigger Point Dry Needling Subsequent Treatment: Instructions provided previously at initial dry needling treatment.  Patient Verbal Consent Given: Yes Education Handout Provided: Previously Provided Muscles Treated: bilateral suboccipitals, bilateral cervical multifidi, bilateral upper trap Electrical Stimulation Performed: No Treatment  Response/Outcome: Assess and progress HEP as indicated, strengthening, flexibility, manual/dry needling as indicated   02/19/2024 UBE level 2.0 x3 min each direction with PT present to discuss status Neck Disability Index score: 5 / 50 = 10.0 % Supine MELT method with soft foam roll for cervical rotation and head nods x20 each Hooklying on soft foam roll in horizontal orientation:  chest/pec stretch x30 sec Hooklying on soft foam roll in horizontal orientation: shoulder ER with green tband 2x10 (cuing for core engagement) Hooklying on soft foam roll in horizontal orientation:  shoulder horizontal abduction with green tband 2x10 Seated cervical SNAGs for rotation x10 bilat Seated cervical SNAGs for extension x10 Seated upper extremity nerve glide x10 bilat Prone with 3# dumbbell: shoulder flexion, extension, and horizontal abduction.  X10 each bilat Soft tissue mobilization to bilateral cervical paraspinals, upper traps/rhomboids with suboccipital release and manual traction 3x10 sec.  Patient reported a decrease in pain with manual traction.   02/12/24 UBE L2; 3 min fwd, 3 min bwd (R UE started to fall asleep with bwd) Doorway pec stretch low, mid, high 2x30 sec each UT stretch x 30" Levator scap stretch x 30" Manual therapy  Skilled assessment and palpation for TPDN  STM & TPR bilat UT, cervical paraspinals,  suboccipitals Trigger Point Dry Needling  Subsequent Treatment: Instructions provided previously at initial dry needling treatment.   Patient Verbal Consent Given: Yes Education Handout Provided: Previously Provided Muscles Treated: bilat UT, cervical paraspinals, suboccipitals Electrical Stimulation Performed: No Treatment Response/Outcome: Twitch response, improved tissue extensibility  Prone  Cervical retraction 10x3"  Cervical retraction + rotation x10  Cervical retraction + "I" and then "W" x10  "Y" with PT assist to keep from UT compensation [employer] + cervical retraction 6# 2x10     PATIENT EDUCATION:  Education details: POC; DN handout & benefits; HEP; explanation of Xray results Person educated: Patient Education method: Explanation, Demonstration, and Handouts Education comprehension: verbalized understanding, returned demonstration, and needs further education  HOME EXERCISE PROGRAM: Access Code: [email or web address] URL: [email or web address] Date: 02/19/2024 Prepared by: Clydie Braun Falicity Sheets  Exercises - Seated Upper Trapezius Stretch  - 1 x daily - 7 x weekly - 2 sets - 20-30 hold - Seated Levator Scapulae Stretch  - 1 x daily - 7 x weekly - 1 sets - 2 reps - 20-30 hold - Seated Scapular Retraction  - 1 x daily - 7 x weekly - 2 sets - 10 reps - Seated Cervical Retraction  - 1 x daily - 7 x weekly - 2 sets - 10 reps - Doorway Pec Stretch at 90 Degrees Abduction  - 1 x daily - 7 x weekly - 1 sets - 2 reps - 20-30 hold - Standing Low Shoulder Row with Anchored Resistance  - 1 x daily - 7 x weekly - 2 sets - 10 reps - Shoulder Extension with Resistance Hands Down  - 1 x daily - 7 x weekly - 2 sets - 10 reps - Ulnar Nerve Glide- Full Arm  - 1 x daily - 7 x weekly - 2 sets - 10 reps - Shoulder W - External Rotation with Resistance  - 1 x daily - 7 x weekly - 2 sets - 10 reps - Isometric Standing Shoulder External Rotation in Abduction  - 1 x daily - 7 x weekly - 2  sets - 10 reps - Isometric Standing Shoulder Internal Rotation - 90 Degrees Abduction  - 1 x daily - 7 x weekly - 2 sets -  10 reps - Cervical Retraction Prone on Elbows  - 1 x daily - 7 x weekly - 2 sets - 10 reps - Seated Assisted Cervical Rotation with Towel  - 1 x daily - 7 x weekly - 1-2 sets - 10 reps - Upper Cervical Extension SNAG with Strap  - 1 x daily - 7 x weekly - 1-2 sets - 10 reps - Seated Upper Extremity Nerve Glide  - 1 x daily - 7 x weekly - 1-2 sets - 10 reps   ASSESSMENT:  CLINICAL IMPRESSION: Ms Schoff presents to skilled PT presents to skilled PT reporting that she felt looser after last session, but continues to have 6/10 discomfort feeling.  Patient reports that new stretches have been helping, but continues to report that dry needling seems to have provided the most relief.  Patient with good response to treatment session and noted improved core engagement during session.  Patient continues to have great response to dry needling with strong twitch responses noted.  Patient continues to report feeling better by end of session and overall decreased pain improved looseness by end of session.  OBJECTIVE IMPAIRMENTS: decreased ROM, decreased strength, increased muscle spasms, impaired flexibility, postural dysfunction, and pain.    GOALS: Goals reviewed with patient? Yes  SHORT TERM GOALS: Target date: 02/17/2024  Patient will be independent with initial HEP. Baseline:  Goal status: Met   2.  Patient will report > or = to 30% improvement in symptoms since starting PT. Baseline:  Goal status: Met on 02/19/2024  3.  Patient will demonstrate correct seated posture. Baseline:  Goal status: Met on 02/23/24   LONG TERM GOALS: Target date: 03/16/2024  Patient will demonstrate independence in advanced HEP. Baseline:  Goal status: Ongoing  2.  Patient will report > or = to 70% improvement in symptoms since starting PT. Baseline:  Goal status: Ongoing (reported 60%  improvement on 02/19/24)  3.  Patient will verbalize and demonstrate self-care strategies to manage pain including tissue mobility practices and change of position. Baseline:  Goal status: Ongoing   4.  Patient will be able to drive and look in her blind spot with < or = to 2/10 neck pain. Baseline:  Goal status: INITIAL  5.  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 Baseline:  Goal status: INITIAL     PLAN:  PT FREQUENCY: 1-2x/week  PT DURATION: 8 weeks  PLANNED INTERVENTIONS: 97164- PT Re-evaluation, 97110-Therapeutic exercises, 97530- Therapeutic activity, [id number]- Neuromuscular re-education, 97535- Self Care, 16109- Manual therapy, [id number]- Canalith repositioning, [id number]- Aquatic Therapy, U0454- Electrical stimulation (unattended), [id number]- Electrical stimulation (manual), [id number]- Vasopneumatic device, [id number]- Ultrasound, [id number]- Traction (mechanical), [id number]- Ionotophoresis 4mg /ml Dexamethasone, Patient/Family education, Taping, Dry Needling, Joint mobilization, Joint manipulation, Spinal manipulation, Spinal mobilization, Vestibular training, Cryotherapy, and Moist heat  PLAN FOR NEXT SESSION: Review HEP;  TPDN as indicated, Stretch pecs and UTs. Nerve glides; postural/scapular strengthening -- especially mid and low traps.  Possible addition of mechanical traction.   Reather Laurence, PT, DPT 02/23/24, 10:18 AM  [facility] [address] [place], [place] [address] Phone # [phone] Fax [phone]

## 2024-02-26 ENCOUNTER — Ambulatory Visit: Admitting: Physical Therapy

## 2024-02-26 ENCOUNTER — Encounter: Payer: Self-pay | Admitting: Physical Therapy

## 2024-02-26 DIAGNOSIS — R293 Abnormal posture: Secondary | ICD-10-CM | POA: Diagnosis not present

## 2024-02-26 DIAGNOSIS — M542 Cervicalgia: Secondary | ICD-10-CM

## 2024-02-26 DIAGNOSIS — G8929 Other chronic pain: Secondary | ICD-10-CM

## 2024-02-26 DIAGNOSIS — J3089 Other allergic rhinitis: Secondary | ICD-10-CM | POA: Diagnosis not present

## 2024-02-26 DIAGNOSIS — M6281 Muscle weakness (generalized): Secondary | ICD-10-CM

## 2024-02-26 DIAGNOSIS — J301 Allergic rhinitis due to pollen: Secondary | ICD-10-CM | POA: Diagnosis not present

## 2024-02-26 DIAGNOSIS — J3081 Allergic rhinitis due to animal (cat) (dog) hair and dander: Secondary | ICD-10-CM | POA: Diagnosis not present

## 2024-02-26 DIAGNOSIS — M25511 Pain in right shoulder: Secondary | ICD-10-CM | POA: Diagnosis not present

## 2024-02-26 NOTE — Therapy (Signed)
 OUTPATIENT PHYSICAL THERAPY TREATMENT NOTE   Patient Name: Maria Goodwin MRN: [id number] DOB:[birth year], 43 y.o., female Today's Date: 02/26/2024  END OF SESSION:  PT End of Session - 02/26/24 1012     Visit Number 9    Date for PT Re-Evaluation 03/16/24    Authorization Type [employer] (50vl)    PT Start Time 0927    PT Stop Time 1012    PT Time Calculation (min) 45 min    Activity Tolerance Patient tolerated treatment well    Behavior During Therapy [facility] for tasks assessed/performed              Past Medical History:  Diagnosis Date   Allergic reaction 02/23/2020   Itchy mouth with watermelon and cucumber.  Refer to allergist.   Allergic rhinitis    Allergy    Anemia 2021   Anxiety Since childhood   Blood in stool 2010   Patient reports hospitalization for blood in stool, diarrhea and extreme dehydration.  She was hospitalized at this time and a colonoscopy was completed.   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   Frequent headaches    GERD (gastroesophageal reflux disease)    Heart murmur    Hypertension    Iron deficiency anemia    Irregular menstrual bleeding 2015   Numbness and tingling of upper extremity 02/23/2020   Osteoarthritis of knee 2016   Other allergic rhinitis 03/23/2020   Patellofemoral pain syndrome 04/2015   Tinea corporis    Tonsil stone 2016   Vitamin D deficiency    Past Surgical History:  Procedure Laterality Date   BREAST BIOPSY Left 08/2016   fibroadenoma   BREAST BIOPSY Left 01/15/2021   BREAST BIOPSY Left 2023   CESAREAN SECTION  2012   WISDOM TOOTH EXTRACTION     Patient Active Problem List   Diagnosis Date Noted   GERD (gastroesophageal reflux disease) 01/14/2024   Encounter for breast cancer screening other than mammogram 01/14/2024   Neck pain 01/14/2024   Iron deficiency 07/30/2023   Pharyngoesophageal dysphagia 07/30/2023   Vitamin D  deficiency 10/15/2021   Obstructive sleep apnea 05/18/2021   Abnormal mammogram 12/28/2020   Palpitations 06/13/2020   Severe menstrual cramps 06/13/2020   Menorrhagia with regular cycle 06/13/2020   Pollen-food allergy 03/23/2020   Hypertension 02/22/2020   E66.9 (BMI 30-39.9) 02/22/2020    PCP: Natalia Leatherwood, DO  REFERRING PROVIDER: Natalia Leatherwood, DO  REFERRING DIAG: M50.30 (ICD-10-CM) - DDD (degenerative disc disease), cervical M99.79 (ICD-10-CM) - Narrowing of intervertebral disc space M54.2 (ICD-10-CM) - Neck pain  THERAPY DIAG:  Cervicalgia  Chronic right shoulder pain  Abnormal posture  Muscle weakness (generalized)  Rationale for Evaluation and Treatment: Rehabilitation  ONSET DATE: Chronic  SUBJECTIVE:  SUBJECTIVE STATEMENT: Patient reports she is doing good today. She is not currently having any pain only some tightness in her upper traps.  Hand dominance: Right  PERTINENT HISTORY:  Anemia; anxiety; HTN; OA right knee  PAIN:  Are you having pain? Yes: NPRS scale: 6/10 Pain location: C- spine bilateral Lt> Rt  Pain description: tight; uncomfortable Aggravating factors: driving in the car Relieving factors: stretching  PRECAUTIONS: None  RED FLAGS: None     WEIGHT BEARING RESTRICTIONS: No  FALLS:  Has patient fallen in last 6 months? Yes. Number of falls fell walking in the woods. Patient verbalized she does not feel her balance is an issue.  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  DIAGNOSTIC FINDINGS:  IMPRESSION: Multilevel degenerative disc disease with severe narrowing of the C4-C5 disc space.  PATIENT SURVEYS:  Eval:  NDI 8/50 16% 02/19/2024:  Neck Disability Index score: 5 / 50 = 10.0 %  SENSATION: Occasional numbness and tingling in Rt arm  POSTURE: rounded shoulders and forward head  PALPATION: Increased muscle spasms of bilateral upper trap, suboccipitals, and cervical paraspinals  Decreased upper T spine mobility joint mobs Grade III   CERVICAL ROM:   Active ROM A/PROM (deg) eval  Flexion 60  Extension 30  Right lateral flexion 32  Left lateral flexion 43  Right rotation 80  Left rotation 85   (Blank rows = not tested)  UPPER EXTREMITY ROM: WFL bilateral    UPPER EXTREMITY MMT:  MMT Right eval Left eval  Shoulder flexion 4 4+  Shoulder extension    Shoulder abduction 4+ 4+  Shoulder adduction    Shoulder extension    Shoulder internal rotation    Shoulder external rotation    Middle trapezius    Lower trapezius    Elbow flexion 5 5  Elbow extension 5 5  Wrist flexion    Wrist extension    Wrist ulnar deviation    Wrist radial deviation    Wrist pronation    Wrist supination    Grip strength     (Blank rows = not tested)    FUNCTIONAL TESTS:  Eval:  5 times sit to stand: 11.92 sec no hands  TREATMENT DATE:  02/26/2024: UBE level 2.0 x3 min each direction with PT present to discuss status Supine MELT method with soft foam roll for cervical rotation and head nods x20 each Hooklying on soft foam roll in horizontal orientation:  chest/pec stretch x30 sec Hooklying on soft foam roll in horizontal orientation: shoulder ER with green tband 2x10 (cuing for core engagement) Hooklying on soft foam roll in horizontal  orientation:  shoulder horizontal abduction with green tband 2x10 Quadruped reach under x10 bilat Open books x 10 each direction (foam roll between legs) Prone alt UE/LE extension x10 bilat Prone with 3# dumbbell: shoulder flexion, abduction, extension, 2 X10 each bilat (attempted abduction but patient had increased pain on Rt ) Y's at wall x10 Standing shoulder extension with green tband 2x10 Standing shoulder rows with green tband 2x10 3 way scap stabilization with  blue loop x 8 each direction Manual: suboccipital release; STM bilateral upper traps    02/23/2024: UBE level 2.0 x3 min each direction with PT present to discuss status Supine MELT method with soft foam roll for cervical rotation and head nods x20 each Hooklying on soft foam roll in horizontal orientation:  chest/pec stretch x30 sec Hooklying on soft foam roll in horizontal orientation: shoulder ER with green tband 2x10 (cuing for core engagement) Hooklying on soft foam roll in horizontal orientation:  shoulder horizontal abduction with green tband 2x10 Quadruped reach under 2x10 bilat Prone alt UE/LE extension x10 bilat Standing shoulder extension with green tband 2x10 Standing shoulder rows with green tband 2x10 Trigger Point Dry Needling Subsequent Treatment: Instructions provided previously at initial dry needling treatment.  Patient Verbal Consent Given: Yes Education Handout Provided: Previously Provided Muscles Treated: bilateral suboccipitals, bilateral cervical multifidi, bilateral upper trap Electrical Stimulation Performed: No Treatment Response/Outcome: Assess and progress HEP as indicated, strengthening, flexibility, manual/dry needling as indicated   02/19/2024 UBE level 2.0 x3 min each direction with PT present to discuss status Neck Disability Index score: 5 / 50 = 10.0 % Supine MELT method with soft foam roll for cervical rotation and head nods x20 each Hooklying on soft foam roll in horizontal  orientation:  chest/pec stretch x30 sec Hooklying on soft foam roll in horizontal orientation: shoulder ER with green tband 2x10 (cuing for core engagement) Hooklying on soft foam roll in horizontal orientation:  shoulder horizontal abduction with green tband 2x10 Seated cervical SNAGs for rotation x10 bilat Seated cervical SNAGs for extension x10 Seated upper extremity nerve glide x10 bilat Prone with 3# dumbbell: shoulder flexion, extension, and horizontal abduction.  X10 each bilat Soft tissue mobilization to bilateral cervical paraspinals, upper traps/rhomboids with suboccipital release and manual traction 3x10 sec.  Patient reported a decrease in pain with manual traction.    PATIENT EDUCATION:  Education details: POC; DN handout & benefits; HEP; explanation of Xray results Person educated: Patient Education method: Explanation, Demonstration, and Handouts Education comprehension: verbalized understanding, returned demonstration, and needs further education  HOME EXERCISE PROGRAM: Access Code: [email or web address] URL: [email or web address] Date: 02/19/2024 Prepared by: Clydie Braun Menke  Exercises - Seated Upper Trapezius Stretch  - 1 x daily - 7 x weekly - 2 sets - 20-30 hold - Seated Levator Scapulae Stretch  - 1 x daily - 7 x weekly - 1 sets - 2 reps - 20-30 hold - Seated Scapular Retraction  - 1 x daily - 7 x weekly - 2 sets - 10 reps - Seated Cervical Retraction  - 1 x daily - 7 x weekly - 2 sets - 10 reps - Doorway Pec Stretch at 90 Degrees Abduction  - 1 x daily - 7 x weekly - 1 sets - 2 reps - 20-30 hold - Standing Low Shoulder Row with Anchored Resistance  - 1 x daily - 7 x weekly - 2 sets - 10 reps - Shoulder Extension with Resistance Hands Down  - 1 x daily - 7 x weekly - 2 sets - 10 reps - Ulnar Nerve Glide- Full Arm  - 1 x daily - 7 x weekly - 2 sets - 10 reps - Shoulder W - External Rotation with Resistance  - 1 x daily - 7 x weekly - 2 sets - 10 reps - Isometric  Standing Shoulder External Rotation in Abduction  - 1 x daily - 7 x weekly - 2 sets - 10 reps - Isometric Standing Shoulder Internal Rotation -  90 Degrees Abduction  - 1 x daily - 7 x weekly - 2 sets - 10 reps - Cervical Retraction Prone on Elbows  - 1 x daily - 7 x weekly - 2 sets - 10 reps - Seated Assisted Cervical Rotation with Towel  - 1 x daily - 7 x weekly - 1-2 sets - 10 reps - Upper Cervical Extension SNAG with Strap  - 1 x daily - 7 x weekly - 1-2 sets - 10 reps - Seated Upper Extremity Nerve Glide  - 1 x daily - 7 x weekly - 1-2 sets - 10 reps   ASSESSMENT:  CLINICAL IMPRESSION: Patient reports feeling 60% better since starting therapy. She still has occasional right arm numbness depending on what position her shoulder is in or how much she is using it. She tolerated treatment session well she had increased pain with prone shoulder abduction exercise, so discontinued exercise for now. Patient required verbal and visual cues for correct exercise performance. Educated patient on the purpose of each exercise and what it is targeting. Patient should continue to progress well with physical therapy. Patient will benefit from skilled PT to address the below impairments and improve overall function.   OBJECTIVE IMPAIRMENTS: decreased ROM, decreased strength, increased muscle spasms, impaired flexibility, postural dysfunction, and pain.    GOALS: Goals reviewed with patient? Yes  SHORT TERM GOALS: Target date: 02/17/2024  Patient will be independent with initial HEP. Baseline:  Goal status: Met   2.  Patient will report > or = to 30% improvement in symptoms since starting PT. Baseline:  Goal status: Met on 02/19/2024  3.  Patient will demonstrate correct seated posture. Baseline:  Goal status: Met on 02/23/24   LONG TERM GOALS: Target date: 03/16/2024  Patient will demonstrate independence in advanced HEP. Baseline:  Goal status: Ongoing  2.  Patient will report > or = to 70%  improvement in symptoms since starting PT. Baseline:  Goal status: Ongoing (reported 60% improvement on 02/26/24)  3.  Patient will verbalize and demonstrate self-care strategies to manage pain including tissue mobility practices and change of position. Baseline:  Goal status: Ongoing   4.  Patient will be able to drive and look in her blind spot with < or = to 2/10 neck pain. Baseline:  Goal status: INITIAL  5.  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 Baseline:  Goal status: INITIAL     PLAN:  PT FREQUENCY: 1-2x/week  PT DURATION: 8 weeks  PLANNED INTERVENTIONS: 97164- PT Re-evaluation, 97110-Therapeutic exercises, 97530- Therapeutic activity, [id number]- Neuromuscular re-education, 97535- Self Care, 95621- Manual therapy, [id number]- Canalith repositioning, [id number]- Aquatic Therapy, [id number]- Electrical stimulation (unattended), [id number]- Electrical stimulation (manual), [id number]- Vasopneumatic device, [id number]- Ultrasound, [id number]- Traction (mechanical), [id number]- Ionotophoresis 4mg /ml Dexamethasone, Patient/Family education, Taping, Dry Needling, Joint mobilization, Joint manipulation, Spinal manipulation, Spinal mobilization, Vestibular training, Cryotherapy, and Moist heat  PLAN FOR NEXT SESSION:  TPDN as indicated, Stretch pecs and UTs. Nerve glides; postural/scapular strengthening -- especially mid and low traps.  Possible addition of mechanical traction.   Claude Manges, PT 02/26/24 10:13 AM [facility] [address] [place], [place] [address] Phone # [phone] Fax [phone]

## 2024-03-01 ENCOUNTER — Encounter: Admitting: Rehabilitative and Restorative Service Providers"

## 2024-03-04 ENCOUNTER — Encounter: Admitting: Physical Therapy

## 2024-03-08 ENCOUNTER — Ambulatory Visit: Admitting: Physical Therapy

## 2024-03-08 ENCOUNTER — Ambulatory Visit
Admission: RE | Admit: 2024-03-08 | Discharge: 2024-03-08 | Disposition: A | Discharge status: Home / Self Care | Payer: Federal, State, Local not specified - PPO | Source: Ambulatory Visit | Attending: Family Medicine | Admitting: Family Medicine

## 2024-03-08 ENCOUNTER — Encounter: Payer: Self-pay | Admitting: Physical Therapy

## 2024-03-08 DIAGNOSIS — Z1231 Encounter for screening mammogram for malignant neoplasm of breast: Secondary | ICD-10-CM

## 2024-03-08 DIAGNOSIS — R293 Abnormal posture: Secondary | ICD-10-CM | POA: Diagnosis not present

## 2024-03-08 DIAGNOSIS — M25511 Pain in right shoulder: Secondary | ICD-10-CM | POA: Diagnosis not present

## 2024-03-08 DIAGNOSIS — G8929 Other chronic pain: Secondary | ICD-10-CM

## 2024-03-08 DIAGNOSIS — M542 Cervicalgia: Secondary | ICD-10-CM

## 2024-03-08 DIAGNOSIS — M6281 Muscle weakness (generalized): Secondary | ICD-10-CM

## 2024-03-08 NOTE — Therapy (Signed)
 OUTPATIENT PHYSICAL THERAPY TREATMENT NOTE   Patient Name: Maria Goodwin MRN: [id number] DOB:August 16, 1981, 43 y.o., female Today's Date: 03/08/2024  END OF SESSION:  PT End of Session - 03/08/24 1555     Visit Number 10    Date for PT Re-Evaluation 03/16/24    Authorization Type [employer] (50vl)    PT Start Time 1442    PT Stop Time 1530    PT Time Calculation (min) 48 min    Activity Tolerance Patient tolerated treatment well    Behavior During Therapy [facility] for tasks assessed/performed               Past Medical History:  Diagnosis Date   Allergic reaction 02/23/2020   Itchy mouth with watermelon and cucumber.  Refer to allergist.   Allergic rhinitis    Allergy    Anemia 2021   Anxiety Since childhood   Blood in stool 2010   Patient reports hospitalization for blood in stool, diarrhea and extreme dehydration.  She was hospitalized at this time and a colonoscopy was completed.   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   Frequent headaches    GERD (gastroesophageal reflux disease)    Heart murmur    Hypertension    Iron  deficiency anemia    Irregular menstrual bleeding 2015   Numbness and tingling of upper extremity 02/23/2020   Osteoarthritis of knee 2016   Other allergic rhinitis 03/23/2020   Patellofemoral pain syndrome 04/2015   Tinea corporis    Tonsil stone 2016   Vitamin D  deficiency    Past Surgical History:  Procedure Laterality Date   BREAST BIOPSY Left 08/2016   fibroadenoma   BREAST BIOPSY Left 01/15/2021   BREAST BIOPSY Left 2023   CESAREAN SECTION  2012   WISDOM TOOTH EXTRACTION     Patient Active Problem List   Diagnosis Date Noted   GERD (gastroesophageal reflux disease) 01/14/2024   Encounter for breast cancer screening other than mammogram 01/14/2024   Neck pain 01/14/2024   Iron  deficiency 07/30/2023   Pharyngoesophageal dysphagia 07/30/2023   Vitamin D   deficiency 10/15/2021   Obstructive sleep apnea 05/18/2021   Abnormal mammogram 12/28/2020   Palpitations 06/13/2020   Severe menstrual cramps 06/13/2020   Menorrhagia with regular cycle 06/13/2020   Pollen-food allergy 03/23/2020   Hypertension 02/22/2020   E66.9 (BMI 30-39.9) 02/22/2020    PCP: Mariel Shope, DO  REFERRING PROVIDER: Mariel Shope, DO  REFERRING DIAG: M50.30 (ICD-10-CM) - DDD (degenerative disc disease), cervical M99.79 (ICD-10-CM) - Narrowing of intervertebral disc space M54.2 (ICD-10-CM) - Neck pain  THERAPY DIAG:  Cervicalgia  Chronic right shoulder pain  Abnormal posture  Muscle weakness (generalized)  Rationale for Evaluation and Treatment: Rehabilitation  ONSET DATE: Chronic  SUBJECTIVE:  SUBJECTIVE STATEMENT: Patient repots her neck feels really tight today. When she has been doing her HEP her whole arm goes numb.  Hand dominance: Right  PERTINENT HISTORY:  Anemia; anxiety; HTN; OA right knee  PAIN: 03/08/2024 Are you having pain? Yes: NPRS scale: 6/10 Pain location: C- spine bilateral Lt> Rt  Pain description: tight; uncomfortable Aggravating factors: driving in the car Relieving factors: stretching  PRECAUTIONS: None  RED FLAGS: None     WEIGHT BEARING RESTRICTIONS: No  FALLS:  Has patient fallen in last 6 months? Yes. Number of falls fell walking in the woods. Patient verbalized she does not feel her balance is an issue.  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  DIAGNOSTIC FINDINGS:  IMPRESSION: Multilevel degenerative disc disease with severe narrowing of the C4-C5 disc space.  PATIENT SURVEYS:  Eval:  NDI 8/50 16% 02/19/2024:  Neck Disability Index score: 5 / 50 = 10.0 %  SENSATION: Occasional numbness and tingling in Rt arm  POSTURE: rounded shoulders and forward head  PALPATION: Increased muscle spasms of bilateral upper trap, suboccipitals, and cervical paraspinals  Decreased upper T spine mobility joint mobs Grade III   CERVICAL ROM:   Active ROM A/PROM (deg) eval  Flexion 60  Extension 30  Right lateral flexion 32  Left lateral flexion 43  Right rotation 80  Left rotation 85   (Blank rows = not tested)  UPPER EXTREMITY ROM: WFL bilateral    UPPER EXTREMITY MMT:  MMT Right eval Left eval  Shoulder flexion 4 4+  Shoulder extension    Shoulder abduction 4+ 4+  Shoulder adduction    Shoulder extension    Shoulder internal rotation    Shoulder external rotation    Middle trapezius    Lower trapezius    Elbow flexion 5 5  Elbow extension 5 5  Wrist flexion    Wrist extension    Wrist ulnar deviation    Wrist radial deviation    Wrist pronation    Wrist supination    Grip strength     (Blank rows = not tested)    FUNCTIONAL TESTS:  Eval:  5 times sit to stand: 11.92 sec no hands  TREATMENT DATE:  03/08/2024: UBE level 2.0 x3 min each direction with PT present to discuss status Doorway pec stretch low, mid, high 2x30 sec each Supine MELT method with soft foam roll for cervical rotation and head nods x20 each Hooklying on soft foam roll in horizontal orientation:  chest/pec stretch x30 sec Open books x 8 each direction  (foam roll between legs) Cat Cow x 10 Trigger Point Dry Needling  Subsequent  Treatment: Instructions provided previously at initial dry needling treatment.   Patient Verbal Consent Given: Yes Education Handout Provided: Previously Provided Muscles Treated: bilateral suboccipitals; Rt cervical multifidi ; bilateral upper trap Electrical Stimulation Performed: No Treatment Response/Outcome: Utilized skilled palpation to identify trigger points.  During dry needling able to palpate muscle twitch and muscle elongation    Manual STM performed after dry needling   02/26/2024: UBE level  2.0 x3 min each direction with PT present to discuss status Supine MELT method with soft foam roll for cervical rotation and head nods x20 each Hooklying on soft foam roll in horizontal orientation:  chest/pec stretch x30 sec Hooklying on soft foam roll in horizontal orientation: shoulder ER with green tband 2x10 (cuing for core engagement) Hooklying on soft foam roll in horizontal orientation:  shoulder horizontal abduction with green tband 2x10 Quadruped reach under x10 bilat Open books x 10 each direction (foam roll between legs) Prone alt UE/LE extension x10 bilat Prone with 3# dumbbell: shoulder flexion, abduction, extension, 2 X10 each bilat (attempted abduction but patient had increased pain on Rt ) Y's at wall x10 Standing shoulder extension with green tband 2x10 Standing shoulder rows with green tband 2x10 3 way scap stabilization with blue loop x 8 each direction Manual: suboccipital release; STM bilateral upper traps    02/23/2024: UBE level 2.0 x3 min each direction with PT present to discuss status Supine MELT method with soft foam roll for cervical rotation and head nods x20 each Hooklying on soft foam roll in horizontal orientation:  chest/pec stretch x30 sec Hooklying on soft foam roll in horizontal orientation: shoulder ER with green tband 2x10 (cuing for core engagement) Hooklying on soft foam roll in horizontal orientation:  shoulder horizontal abduction with green  tband 2x10 Quadruped reach under 2x10 bilat Prone alt UE/LE extension x10 bilat Standing shoulder extension with green tband 2x10 Standing shoulder rows with green tband 2x10 Trigger Point Dry Needling Subsequent Treatment: Instructions provided previously at initial dry needling treatment.  Patient Verbal Consent Given: Yes Education Handout Provided: Previously Provided Muscles Treated: bilateral suboccipitals, bilateral cervical multifidi, bilateral upper trap Electrical Stimulation Performed: No Treatment Response/Outcome: Assess and progress HEP as indicated, strengthening, flexibility, manual/dry needling as indicated    PATIENT EDUCATION:  Education details: POC; DN handout & benefits; HEP; explanation of Xray results Person educated: Patient Education method: Explanation, Demonstration, and Handouts Education comprehension: verbalized understanding, returned demonstration, and needs further education  HOME EXERCISE PROGRAM: Access Code: [email or web address] URL: [email or web address] Date: 02/19/2024 Prepared by: Chaneta Comer Menke  Exercises - Seated Upper Trapezius Stretch  - 1 x daily - 7 x weekly - 2 sets - 20-30 hold - Seated Levator Scapulae Stretch  - 1 x daily - 7 x weekly - 1 sets - 2 reps - 20-30 hold - Seated Scapular Retraction  - 1 x daily - 7 x weekly - 2 sets - 10 reps - Seated Cervical Retraction  - 1 x daily - 7 x weekly - 2 sets - 10 reps - Doorway Pec Stretch at 90 Degrees Abduction  - 1 x daily - 7 x weekly - 1 sets - 2 reps - 20-30 hold - Standing Low Shoulder Row with Anchored Resistance  - 1 x daily - 7 x weekly - 2 sets - 10 reps - Shoulder Extension with Resistance Hands Down  - 1 x daily - 7 x weekly - 2 sets - 10 reps - Ulnar Nerve Glide- Full Arm  - 1 x daily - 7 x weekly - 2 sets - 10 reps - Shoulder W - External Rotation with Resistance  - 1 x daily - 7 x weekly - 2 sets - 10 reps - Isometric Standing Shoulder External Rotation in Abduction  - 1  x daily - 7 x weekly - 2 sets - 10 reps - Isometric Standing Shoulder Internal Rotation - 90 Degrees Abduction  - 1 x daily -  7 x weekly - 2 sets - 10 reps - Cervical Retraction Prone on Elbows  - 1 x daily - 7 x weekly - 2 sets - 10 reps - Seated Assisted Cervical Rotation with Towel  - 1 x daily - 7 x weekly - 1-2 sets - 10 reps - Upper Cervical Extension SNAG with Strap  - 1 x daily - 7 x weekly - 1-2 sets - 10 reps - Seated Upper Extremity Nerve Glide  - 1 x daily - 7 x weekly - 1-2 sets - 10 reps   ASSESSMENT:  CLINICAL IMPRESSION: Maria Goodwin presents to therapy with 6/10 pain. She has not been able to perform all of her HEP exercises with the theraband because they were causing right shoulder numbness. She verbalized that her neck feels really tight. Patient responded well to dry needling and verbalize an instant relief. Palpable muscle twitches were felt during technique. Patient is coming to the end of POC and will reassess next week. Patient will benefit from skilled PT to address the below impairments and improve overall function.    OBJECTIVE IMPAIRMENTS: decreased ROM, decreased strength, increased muscle spasms, impaired flexibility, postural dysfunction, and pain.    GOALS: Goals reviewed with patient? Yes  SHORT TERM GOALS: Target date: 02/17/2024  Patient will be independent with initial HEP. Baseline:  Goal status: Met   2.  Patient will report > or = to 30% improvement in symptoms since starting PT. Baseline:  Goal status: Met on 02/19/2024  3.  Patient will demonstrate correct seated posture. Baseline:  Goal status: Met on 02/23/24   LONG TERM GOALS: Target date: 03/16/2024  Patient will demonstrate independence in advanced HEP. Baseline:  Goal status: Ongoing  2.  Patient will report > or = to 70% improvement in symptoms since starting PT. Baseline:  Goal status: Ongoing (reported 60% improvement on 02/26/24)  3.  Patient will verbalize and demonstrate self-care  strategies to manage pain including tissue mobility practices and change of position. Baseline:  Goal status: Ongoing   4.  Patient will be able to drive and look in her blind spot with < or = to 2/10 neck pain. Baseline:  Goal status: INITIAL  5.  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 Baseline:  Goal status: INITIAL     PLAN:  PT FREQUENCY: 1-2x/week  PT DURATION: 8 weeks  PLANNED INTERVENTIONS: 97164- PT Re-evaluation, 97110-Therapeutic exercises, 97530- Therapeutic activity, [id number]- Neuromuscular re-education, 97535- Self Care, 16109- Manual therapy, [phone]- Canalith repositioning, [id number]- Aquatic Therapy, [id number]- Electrical stimulation (unattended), [id number]- Electrical stimulation (manual), [id number]- Vasopneumatic device, [id number]- Ultrasound, [id number]- Traction (mechanical), [id number]- Ionotophoresis 4mg /ml Dexamethasone, Patient/Family education, Taping, Dry Needling, Joint mobilization, Joint manipulation, Spinal manipulation, Spinal mobilization, Vestibular training, Cryotherapy, and Moist heat  PLAN FOR NEXT SESSION:  assess tolerance to Dry needling; assess numbness/ arm falling asleep; TPDN as indicated, Stretch pecs and UTs. Nerve glides; postural/scapular strengthening -- especially mid and low traps.  Possible addition of mechanical traction.   Penelope Bowie, PT 03/08/24 3:57 PM [facility] [address] [place], [place] [address] Phone # [phone] Fax [phone]

## 2024-03-11 ENCOUNTER — Ambulatory Visit: Admitting: Physical Therapy

## 2024-03-11 ENCOUNTER — Encounter: Payer: Self-pay | Admitting: Physical Therapy

## 2024-03-11 DIAGNOSIS — J3081 Allergic rhinitis due to animal (cat) (dog) hair and dander: Secondary | ICD-10-CM | POA: Diagnosis not present

## 2024-03-11 DIAGNOSIS — R293 Abnormal posture: Secondary | ICD-10-CM

## 2024-03-11 DIAGNOSIS — M542 Cervicalgia: Secondary | ICD-10-CM

## 2024-03-11 DIAGNOSIS — G8929 Other chronic pain: Secondary | ICD-10-CM | POA: Diagnosis not present

## 2024-03-11 DIAGNOSIS — M25511 Pain in right shoulder: Secondary | ICD-10-CM | POA: Diagnosis not present

## 2024-03-11 DIAGNOSIS — M6281 Muscle weakness (generalized): Secondary | ICD-10-CM | POA: Diagnosis not present

## 2024-03-11 DIAGNOSIS — J301 Allergic rhinitis due to pollen: Secondary | ICD-10-CM | POA: Diagnosis not present

## 2024-03-11 DIAGNOSIS — J3089 Other allergic rhinitis: Secondary | ICD-10-CM | POA: Diagnosis not present

## 2024-03-11 NOTE — Therapy (Signed)
 OUTPATIENT PHYSICAL THERAPY TREATMENT NOTE   Patient Name: Maria Goodwin MRN: [id number] DOB:08/06/1981, 43 y.o., female Today's Date: 03/11/2024  END OF SESSION:  PT End of Session - 03/11/24 1025     Visit Number 11    Date for PT Re-Evaluation 03/16/24    Authorization Type [employer] (50vl)    PT Start Time [id number]    PT Stop Time 1014    PT Time Calculation (min) 46 min    Activity Tolerance Patient tolerated treatment well    Behavior During Therapy [facility] for tasks assessed/performed                Past Medical History:  Diagnosis Date   Allergic reaction 02/23/2020   Itchy mouth with watermelon and cucumber.  Refer to allergist.   Allergic rhinitis    Allergy    Anemia 2021   Anxiety Since childhood   Blood in stool 2010   Patient reports hospitalization for blood in stool, diarrhea and extreme dehydration.  She was hospitalized at this time and a colonoscopy was completed.   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   Frequent headaches    GERD (gastroesophageal reflux disease)    Heart murmur    Hypertension    Iron  deficiency anemia    Irregular menstrual bleeding 2015   Numbness and tingling of upper extremity 02/23/2020   Osteoarthritis of knee 2016   Other allergic rhinitis 03/23/2020   Patellofemoral pain syndrome 04/2015   Tinea corporis    Tonsil stone 2016   Vitamin D  deficiency    Past Surgical History:  Procedure Laterality Date   BREAST BIOPSY Left 08/2016   fibroadenoma   BREAST BIOPSY Left 01/15/2021   BREAST BIOPSY Left 2023   CESAREAN SECTION  2012   WISDOM TOOTH EXTRACTION     Patient Active Problem List   Diagnosis Date Noted   GERD (gastroesophageal reflux disease) 01/14/2024   Encounter for breast cancer screening other than mammogram 01/14/2024   Neck pain 01/14/2024   Iron  deficiency 07/30/2023   Pharyngoesophageal dysphagia 07/30/2023   Vitamin D   deficiency 10/15/2021   Obstructive sleep apnea 05/18/2021   Abnormal mammogram 12/28/2020   Palpitations 06/13/2020   Severe menstrual cramps 06/13/2020   Menorrhagia with regular cycle 06/13/2020   Pollen-food allergy 03/23/2020   Hypertension 02/22/2020   E66.9 (BMI 30-39.9) 02/22/2020    PCP: Mariel Shope, DO  REFERRING PROVIDER: Mariel Shope, DO  REFERRING DIAG: M50.30 (ICD-10-CM) - DDD (degenerative disc disease), cervical M99.79 (ICD-10-CM) - Narrowing of intervertebral disc space M54.2 (ICD-10-CM) - Neck pain  THERAPY DIAG:  Cervicalgia  Chronic right shoulder pain  Abnormal posture  Muscle weakness (generalized)  Rationale for Evaluation and Treatment: Rehabilitation  ONSET DATE: Chronic  SUBJECTIVE:  SUBJECTIVE STATEMENT: Patient reports dry needling really helped her pain and she feels better. She is still having numbness in her hands.  Hand dominance: Right  PERTINENT HISTORY:  Anemia; anxiety; HTN; OA right knee  PAIN: 03/08/2024 Are you having pain? Yes: NPRS scale: 6/10 Pain location: C- spine bilateral Lt> Rt  Pain description: tight; uncomfortable Aggravating factors: driving in the car Relieving factors: stretching  PRECAUTIONS: None  RED FLAGS: None     WEIGHT BEARING RESTRICTIONS: No  FALLS:  Has patient fallen in last 6 months? Yes. Number of falls fell walking in the woods. Patient verbalized she does not feel her balance is an issue.  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  DIAGNOSTIC FINDINGS:  IMPRESSION: Multilevel degenerative disc disease with severe narrowing of the C4-C5 disc space.  PATIENT SURVEYS:  Eval:  NDI 8/50 16% 02/19/2024:  Neck Disability Index score: 5 / 50 = 10.0 %  SENSATION: Occasional numbness and tingling in Rt arm  POSTURE: rounded shoulders and forward head  PALPATION: Increased muscle spasms of bilateral upper trap, suboccipitals, and cervical paraspinals  Decreased upper T spine mobility joint mobs Grade III   CERVICAL ROM:   Active ROM A/PROM (deg) eval  Flexion 60  Extension 30  Right lateral flexion 32  Left lateral flexion 43  Right rotation 80  Left rotation 85   (Blank rows = not tested)  UPPER EXTREMITY ROM: WFL bilateral    UPPER EXTREMITY MMT:  MMT Right eval Left eval  Shoulder flexion 4 4+  Shoulder extension    Shoulder abduction 4+ 4+  Shoulder adduction    Shoulder extension    Shoulder internal rotation    Shoulder external rotation    Middle trapezius    Lower trapezius    Elbow flexion 5 5  Elbow extension 5 5  Wrist flexion    Wrist extension    Wrist ulnar deviation    Wrist radial deviation    Wrist pronation    Wrist supination    Grip strength     (Blank rows = not tested)    FUNCTIONAL TESTS:  Eval:  5 times sit to stand: 11.92 sec no hands  TREATMENT DATE:  03/11/2024: UBE level 2.0 x3 min each direction with PT present to discuss status Doorway pec stretch low, mid, high 2x30 sec each Upper trap stretch 2 x 30 sec bilateral  Y's at wall 2 x10 Supine MELT method with soft foam roll for cervical rotation and head nods x20 each Open books x 8 each direction  (foam roll between legs) Cat Cow x 10 Quadruped chin tucks 2 x 10 Prone with 3# dumbbell: shoulder  flexion, abduction, extension, rows  X10 each bila (with abduction on Rt no weight thumbs down for middle trap activation)  Manual: STM to bilateral upper traps and suboccipital release    03/08/2024: UBE level 2.0 x3 min each direction with PT present to discuss status Doorway pec stretch low, mid, high 2x30 sec each Supine MELT method with soft foam roll for cervical  rotation and head nods x20 each Hooklying on soft foam roll in horizontal orientation:  chest/pec stretch x30 sec Open books x 8 each direction  (foam roll between legs) Cat Cow x 10 Trigger Point Dry Needling  Subsequent Treatment: Instructions provided previously at initial dry needling treatment.   Patient Verbal Consent Given: Yes Education Handout Provided: Previously Provided Muscles Treated: bilateral suboccipitals; Rt cervical multifidi ; bilateral upper trap Electrical Stimulation Performed: No Treatment Response/Outcome: Utilized skilled palpation to identify trigger points.  During dry needling able to palpate muscle twitch and muscle elongation    Manual STM performed after dry needling   02/26/2024: UBE level 2.0 x3 min each direction with PT present to discuss status Supine MELT method with soft foam roll for cervical rotation and head nods x20 each Hooklying on soft foam roll in horizontal orientation:  chest/pec stretch x30 sec Hooklying on soft foam roll in horizontal orientation: shoulder ER with green tband 2x10 (cuing for core engagement) Hooklying on soft foam roll in horizontal orientation:  shoulder horizontal abduction with green tband 2x10 Quadruped reach under x10 bilat Open books x 10 each direction (foam roll between legs) Prone alt UE/LE extension x10 bilat Prone with 3# dumbbell: shoulder flexion, abduction, extension, 2 X10 each bilat (attempted abduction but patient had increased pain on Rt ) Y's at wall x10 Standing shoulder extension with green tband 2x10 Standing shoulder rows with  green tband 2x10 3 way scap stabilization with blue loop x 8 each direction Manual: suboccipital release; STM bilateral upper traps      PATIENT EDUCATION:  Education details: POC; DN handout & benefits; HEP; explanation of Xray results Person educated: Patient Education method: Explanation, Demonstration, and Handouts Education comprehension: verbalized understanding, returned demonstration, and needs further education  HOME EXERCISE PROGRAM: Access Code: [email or web address] URL: [email or web address] Date: 02/19/2024 Prepared by: Chaneta Comer Menke  Exercises - Seated Upper Trapezius Stretch  - 1 x daily - 7 x weekly - 2 sets - 20-30 hold - Seated Levator Scapulae Stretch  - 1 x daily - 7 x weekly - 1 sets - 2 reps - 20-30 hold - Seated Scapular Retraction  - 1 x daily - 7 x weekly - 2 sets - 10 reps - Seated Cervical Retraction  - 1 x daily - 7 x weekly - 2 sets - 10 reps - Doorway Pec Stretch at 90 Degrees Abduction  - 1 x daily - 7 x weekly - 1 sets - 2 reps - 20-30 hold - Standing Low Shoulder Row with Anchored Resistance  - 1 x daily - 7 x weekly - 2 sets - 10 reps - Shoulder Extension with Resistance Hands Down  - 1 x daily - 7 x weekly - 2 sets - 10 reps - Ulnar Nerve Glide- Full Arm  - 1 x daily - 7 x weekly - 2 sets - 10 reps - Shoulder W - External Rotation with Resistance  - 1 x daily - 7 x weekly - 2 sets - 10 reps - Isometric Standing Shoulder External Rotation in Abduction  - 1 x daily - 7 x weekly - 2 sets - 10 reps - Isometric Standing Shoulder Internal Rotation - 90 Degrees Abduction  - 1 x daily - 7 x weekly - 2 sets - 10 reps - Cervical Retraction Prone on Elbows  - 1 x daily - 7 x weekly - 2 sets - 10 reps - Seated Assisted Cervical Rotation with Towel  - 1 x daily - 7 x  weekly - 1-2 sets - 10 reps - Upper Cervical Extension SNAG with Strap  - 1 x daily - 7 x weekly - 1-2 sets - 10 reps - Seated Upper Extremity Nerve Glide  - 1 x daily - 7 x weekly - 1-2 sets -  10 reps   ASSESSMENT:  CLINICAL IMPRESSION: Today's treatment session focused on postural strengthening and cervical & thoracic mobility. Elaf verbalized dry needling last session provided her great relief. She she is having occasional numbness in her right hand and fingers, but nerve glides are helpful. Overall, patient has responded well to therapy and verbalizes improved functional use of upper extremity. Patient will benefit from skilled PT to address the below impairments and improve overall function.     OBJECTIVE IMPAIRMENTS: decreased ROM, decreased strength, increased muscle spasms, impaired flexibility, postural dysfunction, and pain.    GOALS: Goals reviewed with patient? Yes  SHORT TERM GOALS: Target date: 02/17/2024  Patient will be independent with initial HEP. Baseline:  Goal status: Met   2.  Patient will report > or = to 30% improvement in symptoms since starting PT. Baseline:  Goal status: Met on 02/19/2024  3.  Patient will demonstrate correct seated posture. Baseline:  Goal status: Met on 02/23/24   LONG TERM GOALS: Target date: 03/16/2024  Patient will demonstrate independence in advanced HEP. Baseline:  Goal status: Ongoing  2.  Patient will report > or = to 70% improvement in symptoms since starting PT. Baseline:  Goal status: Ongoing (reported 60% improvement on 02/26/24)  3.  Patient will verbalize and demonstrate self-care strategies to manage pain including tissue mobility practices and change of position. Baseline:  Goal status: Ongoing   4.  Patient will be able to drive and look in her blind spot with < or = to 2/10 neck pain. Baseline:  Goal status: INITIAL  5.  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 Baseline:  Goal status: INITIAL     PLAN:  PT FREQUENCY: 1-2x/week  PT DURATION: 8 weeks  PLANNED INTERVENTIONS: 97164- PT Re-evaluation, 97110-Therapeutic exercises, 97530-  Therapeutic activity, [id number]- Neuromuscular re-education, 97535- Self Care, [id number]- Manual therapy, [id number]- Canalith repositioning, [id number]- Aquatic Therapy, [id number]- Electrical stimulation (unattended), [id number]- Electrical stimulation (manual), [id number]- Vasopneumatic device, [id number]- Ultrasound, [id number]- Traction (mechanical), [id number]- Ionotophoresis 4mg /ml Dexamethasone, Patient/Family education, Taping, Dry Needling, Joint mobilization, Joint manipulation, Spinal manipulation, Spinal mobilization, Vestibular training, Cryotherapy, and Moist heat  PLAN FOR NEXT SESSION:  TPDN as indicated, Stretch pecs and UTs. Nerve glides; postural/scapular strengthening -- especially mid and low traps.  Possible addition of mechanical traction.   Penelope Bowie, PT 03/11/24 10:27 AM [facility] [address], [address] [place], [place] [address] Phone # [phone] Fax [phone]

## 2024-03-12 ENCOUNTER — Other Ambulatory Visit: Payer: Self-pay | Admitting: Family Medicine

## 2024-03-12 DIAGNOSIS — R928 Other abnormal and inconclusive findings on diagnostic imaging of breast: Secondary | ICD-10-CM

## 2024-03-15 ENCOUNTER — Ambulatory Visit: Admitting: Physical Therapy

## 2024-03-15 ENCOUNTER — Encounter: Payer: Self-pay | Admitting: Physical Therapy

## 2024-03-15 DIAGNOSIS — M6281 Muscle weakness (generalized): Secondary | ICD-10-CM

## 2024-03-15 DIAGNOSIS — M542 Cervicalgia: Secondary | ICD-10-CM | POA: Diagnosis not present

## 2024-03-15 DIAGNOSIS — R293 Abnormal posture: Secondary | ICD-10-CM

## 2024-03-15 DIAGNOSIS — G8929 Other chronic pain: Secondary | ICD-10-CM | POA: Diagnosis not present

## 2024-03-15 DIAGNOSIS — M25511 Pain in right shoulder: Secondary | ICD-10-CM | POA: Diagnosis not present

## 2024-03-15 NOTE — Therapy (Signed)
 OUTPATIENT PHYSICAL THERAPY TREATMENT NOTE   Patient Name: Maria Goodwin MRN: [id number] DOB:[birth year], 43 y.o., female Today's Date: 03/15/2024  END OF SESSION:  PT End of Session - 03/15/24 1201     Visit Number 12    Date for PT Re-Evaluation 03/16/24    Authorization Type [employer] (50vl)    PT Start Time 1059    PT Stop Time 1154    PT Time Calculation (min) 55 min    Activity Tolerance Patient tolerated treatment well    Behavior During Therapy WFL for tasks assessed/performed                 Past Medical History:  Diagnosis Date   Allergic reaction 02/23/2020   Itchy mouth with watermelon and cucumber.  Refer to allergist.   Allergic rhinitis    Allergy    Anemia 2021   Anxiety Since childhood   Blood in stool 2010   Patient reports hospitalization for blood in stool, diarrhea and extreme dehydration.  She was hospitalized at this time and a colonoscopy was completed.   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   Frequent headaches    GERD (gastroesophageal reflux disease)    Heart murmur    Hypertension    Iron  deficiency anemia    Irregular menstrual bleeding 2015   Numbness and tingling of upper extremity 02/23/2020   Osteoarthritis of knee 2016   Other allergic rhinitis 03/23/2020   Patellofemoral pain syndrome 04/2015   Tinea corporis    Tonsil stone 2016   Vitamin D  deficiency    Past Surgical History:  Procedure Laterality Date   BREAST BIOPSY Left 08/2016   fibroadenoma   BREAST BIOPSY Left 01/15/2021   BREAST BIOPSY Left 2023   CESAREAN SECTION  2012   WISDOM TOOTH EXTRACTION     Patient Active Problem List   Diagnosis Date Noted   GERD (gastroesophageal reflux disease) 01/14/2024   Encounter for breast cancer screening other than mammogram 01/14/2024   Neck pain 01/14/2024   Iron  deficiency 07/30/2023   Pharyngoesophageal dysphagia 07/30/2023   Vitamin D   deficiency 10/15/2021   Obstructive sleep apnea 05/18/2021   Abnormal mammogram 12/28/2020   Palpitations 06/13/2020   Severe menstrual cramps 06/13/2020   Menorrhagia with regular cycle 06/13/2020   Pollen-food allergy 03/23/2020   Hypertension 02/22/2020   E66.9 (BMI 30-39.9) 02/22/2020    PCP: Mariel Shope, DO  REFERRING PROVIDER: Mariel Shope, DO  REFERRING DIAG: M50.30 (ICD-10-CM) - DDD (degenerative disc disease), cervical M99.79 (ICD-10-CM) - Narrowing of intervertebral disc space M54.2 (ICD-10-CM) - Neck pain  THERAPY DIAG:  Cervicalgia  Chronic right shoulder pain  Abnormal posture  Muscle weakness (generalized)  Rationale for Evaluation and Treatment: Rehabilitation  ONSET DATE: Chronic  SUBJECTIVE:  SUBJECTIVE STATEMENT: Patient reports she is doing better than she was last week. She is still having numbness in her right hand Hand dominance: Right  PERTINENT HISTORY:  Anemia; anxiety; HTN; OA right knee  PAIN: 03/15/2024 Are you having pain? Yes: NPRS scale: 6/10 Pain location: C- spine bilateral Lt> Rt  Pain description: tight; uncomfortable Aggravating factors: driving in the car Relieving factors: stretching  PRECAUTIONS: None  RED FLAGS: None     WEIGHT BEARING RESTRICTIONS: No  FALLS:  Has patient fallen in last 6 months? Yes. Number of falls fell walking in the woods. Patient verbalized she does not feel her balance is an issue.  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  DIAGNOSTIC FINDINGS:  IMPRESSION: Multilevel degenerative disc disease with severe narrowing of the C4-C5 disc space.  PATIENT SURVEYS:  Eval:  NDI 8/50 16% 02/19/2024:  Neck Disability Index score: 5 / 50 = 10.0 %  SENSATION: Occasional numbness and tingling in Rt arm  POSTURE: rounded shoulders and forward head  PALPATION: Increased muscle spasms of bilateral upper trap, suboccipitals, and cervical paraspinals  Decreased upper T spine mobility joint mobs Grade III   CERVICAL ROM:   Active ROM A/PROM (deg) eval  Flexion 60  Extension 30  Right lateral flexion 32  Left lateral flexion 43  Right rotation 80  Left rotation 85   (Blank rows = not tested)  UPPER EXTREMITY ROM: WFL bilateral    UPPER EXTREMITY MMT:  MMT Right eval Left eval  Shoulder flexion 4 4+  Shoulder extension    Shoulder abduction 4+ 4+  Shoulder adduction    Shoulder extension    Shoulder internal rotation    Shoulder external rotation    Middle trapezius    Lower trapezius    Elbow flexion 5 5  Elbow extension 5 5  Wrist flexion    Wrist extension    Wrist ulnar deviation    Wrist radial deviation    Wrist pronation    Wrist supination    Grip strength     (Blank rows = not tested)    FUNCTIONAL TESTS:  Eval:  5 times sit to stand: 11.92 sec no hands  TREATMENT DATE:  03/15/2024: UBE level 2.0 x3 min total (forwards only) PT present to discuss status (stopped because patient's hands were numb) Median nerve glide x 10  Upper trap stretch 2 x 30 sec bilateral  Y's at wall 2 x 10 Plank taps on wall 2 x 10 3 way scap stabilization with blue loop x 8 each direction Iso shoulder abduction against blue loop + shoulder flexion x 10 Thread the needle x 8 each direction Open  books x 8 each direction  (foam roll between legs) Hooklying with soft foam roll down spine shoulder ER with red TB 2 x 10 Hookyling with soft foam roll down spine shoulder abduction 2 x 10 Prone with 3# dumbbell: shoulder flexion, abduction, extension, 2 X10 each bilat (attempted abduction but patient had increased pain on Rt ) Cat Cow x 10 Trigger Point Dry  Needling  Subsequent Treatment: Instructions provided previously at initial dry needling treatment.   Patient Verbal Consent Given: Yes Education Handout Provided: Previously Provided Muscles Treated: bilateral suboccipitals ; bilateral upper traps Electrical Stimulation Performed: No Treatment Response/Outcome: Utilized skilled palpation to identify trigger points.  During dry needling able to palpate muscle twitch and muscle elongation  Manual: STM to bilateral upper traps and suboccipital release    03/11/2024: UBE level 2.0 x3 min each direction with PT present to discuss status Doorway pec stretch low, mid, high 2x30 sec each Upper trap stretch 2 x 30 sec bilateral  Y's at wall 2 x10 Supine MELT method with soft foam roll for cervical rotation and head nods x20 each Open books x 8 each direction  (foam roll between legs) Cat Cow x 10 Quadruped chin tucks 2 x 10 Prone with 3# dumbbell: shoulder flexion, abduction, extension, rows  X10 each bila (with abduction on Rt no weight thumbs down for middle trap activation)  Manual: STM to bilateral upper traps and suboccipital release    03/08/2024: UBE level 2.0 x3 min each direction with PT present to discuss status Doorway pec stretch low, mid, high 2x30 sec each Supine MELT method with soft foam roll for cervical rotation and head nods x20 each Hooklying on soft foam roll in horizontal orientation:  chest/pec stretch x30 sec Open books x 8 each direction  (foam roll between legs) Cat Cow x 10 Trigger Point Dry Needling  Subsequent Treatment: Instructions provided  previously at initial dry needling treatment.   Patient Verbal Consent Given: Yes Education Handout Provided: Previously Provided Muscles Treated: bilateral suboccipitals; Rt cervical multifidi ; bilateral upper trap Electrical Stimulation Performed: No Treatment Response/Outcome: Utilized skilled palpation to identify trigger points.  During dry needling able to palpate muscle twitch and muscle elongation    Manual STM performed after dry needling      PATIENT EDUCATION:  Education details: POC; DN handout & benefits; HEP; explanation of Xray results Person educated: Patient Education method: Explanation, Demonstration, and Handouts Education comprehension: verbalized understanding, returned demonstration, and needs further education  HOME EXERCISE PROGRAM: Access Code: [email or web address] URL: [email or web address] Date: 02/19/2024 Prepared by: Chaneta Comer Menke  Exercises - Seated Upper Trapezius Stretch  - 1 x daily - 7 x weekly - 2 sets - 20-30 hold - Seated Levator Scapulae Stretch  - 1 x daily - 7 x weekly - 1 sets - 2 reps - 20-30 hold - Seated Scapular Retraction  - 1 x daily - 7 x weekly - 2 sets - 10 reps - Seated Cervical Retraction  - 1 x daily - 7 x weekly - 2 sets - 10 reps - Doorway Pec Stretch at 90 Degrees Abduction  - 1 x daily - 7 x weekly - 1 sets - 2 reps - 20-30 hold - Standing Low Shoulder Row with Anchored Resistance  - 1 x daily - 7 x weekly - 2 sets - 10 reps - Shoulder Extension with Resistance Hands Down  - 1 x daily - 7 x weekly - 2 sets - 10 reps - Ulnar Nerve Glide- Full Arm  - 1 x daily - 7 x weekly - 2 sets - 10 reps - Shoulder W - External Rotation with Resistance  - 1 x daily - 7 x weekly - 2 sets - 10 reps - Isometric Standing Shoulder External Rotation in Abduction  - 1 x daily - 7 x weekly - 2 sets - 10 reps - Isometric Standing Shoulder Internal  Rotation - 90 Degrees Abduction  - 1 x daily - 7 x weekly - 2 sets - 10 reps - Cervical  Retraction Prone on Elbows  - 1 x daily - 7 x weekly - 2 sets - 10 reps - Seated Assisted Cervical Rotation with Towel  - 1 x daily - 7 x weekly - 1-2 sets - 10 reps - Upper Cervical Extension SNAG with Strap  - 1 x daily - 7 x weekly - 1-2 sets - 10 reps - Seated Upper Extremity Nerve Glide  - 1 x daily - 7 x weekly - 1-2 sets - 10 reps   ASSESSMENT:  CLINICAL IMPRESSION: Today's treatment session focused on postural strengthening and cervical & thoracic mobility. Mariesa still verbalizes increased numbness in her right hand after doing different types of activity. The numbness is more pronounced with over head and closed pack position of shoulder joint. Explained this to patient and she understood. Dry needling was successful multiple twitch responses were elicited in suboccipitals and upper traps. Patient will benefit from skilled PT to address the below impairments and improve overall function.      OBJECTIVE IMPAIRMENTS: decreased ROM, decreased strength, increased muscle spasms, impaired flexibility, postural dysfunction, and pain.    GOALS: Goals reviewed with patient? Yes  SHORT TERM GOALS: Target date: 02/17/2024  Patient will be independent with initial HEP. Baseline:  Goal status: Met   2.  Patient will report > or = to 30% improvement in symptoms since starting PT. Baseline:  Goal status: Met on 02/19/2024  3.  Patient will demonstrate correct seated posture. Baseline:  Goal status: Met on 02/23/24   LONG TERM GOALS: Target date: 03/16/2024  Patient will demonstrate independence in advanced HEP. Baseline:  Goal status: Ongoing  2.  Patient will report > or = to 70% improvement in symptoms since starting PT. Baseline:  Goal status: Ongoing (reported 60% improvement on 02/26/24)  3.  Patient will verbalize and demonstrate self-care strategies to manage pain including tissue mobility practices and change of position. Baseline:  Goal status: Ongoing   4.  Patient  will be able to drive and look in her blind spot with < or = to 2/10 neck pain. Baseline:  Goal status: INITIAL  5.  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 Baseline:  Goal status: INITIAL     PLAN:  PT FREQUENCY: 1-2x/week  PT DURATION: 8 weeks  PLANNED INTERVENTIONS: 97164- PT Re-evaluation, 97110-Therapeutic exercises, 97530- Therapeutic activity, 97112- Neuromuscular re-education, 97535- Self Care, 16109- Manual therapy, [phone]- Canalith repositioning, [id number]- Aquatic Therapy, [id number]- Electrical stimulation (unattended), [phone]- Electrical stimulation (manual), 97016- Vasopneumatic device, [id number]- Ultrasound, [id number]- Traction (mechanical), [id number]- Ionotophoresis 4mg /ml Dexamethasone, Patient/Family education, Taping, Dry Needling, Joint mobilization, Joint manipulation, Spinal manipulation, Spinal mobilization, Vestibular training, Cryotherapy, and Moist heat  PLAN FOR NEXT SESSION:  assess responses to DN,; re certification; continue postural/scapular strengthening    Penelope Bowie, PT 03/15/24 12:03 PM [facility] [address] [place], [place] [address] Phone # [phone] Fax [phone]

## 2024-03-18 ENCOUNTER — Ambulatory Visit: Admitting: Physical Therapy

## 2024-03-23 DIAGNOSIS — R058 Other specified cough: Secondary | ICD-10-CM | POA: Diagnosis not present

## 2024-04-01 ENCOUNTER — Encounter: Payer: Self-pay | Admitting: Rehabilitative and Restorative Service Providers"

## 2024-04-01 ENCOUNTER — Ambulatory Visit: Attending: Family Medicine | Admitting: Rehabilitative and Restorative Service Providers"

## 2024-04-01 DIAGNOSIS — J301 Allergic rhinitis due to pollen: Secondary | ICD-10-CM | POA: Diagnosis not present

## 2024-04-01 DIAGNOSIS — M25511 Pain in right shoulder: Secondary | ICD-10-CM | POA: Diagnosis not present

## 2024-04-01 DIAGNOSIS — M542 Cervicalgia: Secondary | ICD-10-CM | POA: Insufficient documentation

## 2024-04-01 DIAGNOSIS — J3081 Allergic rhinitis due to animal (cat) (dog) hair and dander: Secondary | ICD-10-CM | POA: Diagnosis not present

## 2024-04-01 DIAGNOSIS — G8929 Other chronic pain: Secondary | ICD-10-CM | POA: Insufficient documentation

## 2024-04-01 DIAGNOSIS — R293 Abnormal posture: Secondary | ICD-10-CM | POA: Insufficient documentation

## 2024-04-01 DIAGNOSIS — R252 Cramp and spasm: Secondary | ICD-10-CM | POA: Diagnosis not present

## 2024-04-01 DIAGNOSIS — J3089 Other allergic rhinitis: Secondary | ICD-10-CM | POA: Diagnosis not present

## 2024-04-01 DIAGNOSIS — M6281 Muscle weakness (generalized): Secondary | ICD-10-CM | POA: Diagnosis not present

## 2024-04-01 NOTE — Therapy (Signed)
 OUTPATIENT PHYSICAL THERAPY TREATMENT NOTE AND REASSESSMENT NOTE   Patient Name: Maria Goodwin MRN: [id number] DOB:28-Jul-1981, 43 y.o., female Today's Date: 04/01/2024  END OF SESSION:  PT End of Session - 04/01/24 1148     Visit Number 13    Date for PT Re-Evaluation 05/14/24    Authorization Type [employer] (50vl)    PT Start Time 1145    PT Stop Time 1225    PT Time Calculation (min) 40 min    Activity Tolerance Patient tolerated treatment well    Behavior During Therapy WFL for tasks assessed/performed                 Past Medical History:  Diagnosis Date   Allergic reaction 02/23/2020   Itchy mouth with watermelon and cucumber.  Refer to allergist.   Allergic rhinitis    Allergy    Anemia 2021   Anxiety Since childhood   Blood in stool 2010   Patient reports hospitalization for blood in stool, diarrhea and extreme dehydration.  She was hospitalized at this time and a colonoscopy was completed.   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   Frequent headaches    GERD (gastroesophageal reflux disease)    Heart murmur    Hypertension    Iron  deficiency anemia    Irregular menstrual bleeding 2015   Numbness and tingling of upper extremity 02/23/2020   Osteoarthritis of knee 2016   Other allergic rhinitis 03/23/2020   Patellofemoral pain syndrome 04/2015   Tinea corporis    Tonsil stone 2016   Vitamin D  deficiency    Past Surgical History:  Procedure Laterality Date   BREAST BIOPSY Left 08/2016   fibroadenoma   BREAST BIOPSY Left 01/15/2021   BREAST BIOPSY Left 2023   CESAREAN SECTION  2012   WISDOM TOOTH EXTRACTION     Patient Active Problem List   Diagnosis Date Noted   GERD (gastroesophageal reflux disease) 01/14/2024   Encounter for breast cancer screening other than mammogram 01/14/2024   Neck pain 01/14/2024   Iron  deficiency 07/30/2023   Pharyngoesophageal dysphagia  07/30/2023   Vitamin D  deficiency 10/15/2021   Obstructive sleep apnea 05/18/2021   Abnormal mammogram 12/28/2020   Palpitations 06/13/2020   Severe menstrual cramps 06/13/2020   Menorrhagia with regular cycle 06/13/2020   Pollen-food allergy 03/23/2020   Hypertension 02/22/2020   E66.9 (BMI 30-39.9) 02/22/2020    PCP: Mariel Shope, DO  REFERRING PROVIDER: Mariel Shope, DO  REFERRING DIAG: M50.30 (ICD-10-CM) - DDD (degenerative disc disease), cervical M99.79 (ICD-10-CM) - Narrowing of intervertebral disc space M54.2 (ICD-10-CM) - Neck pain  THERAPY DIAG:  Cervicalgia  Chronic right shoulder pain  Abnormal posture  Muscle weakness (generalized)  Rationale for Evaluation and Treatment: Rehabilitation  ONSET DATE: Chronic  SUBJECTIVE:  SUBJECTIVE STATEMENT: Patient states that she did not feel well enough to do all of her exercises while she was sick with bronchitis.  States that her neck is feeling stiff, but her nerve pain has improved.  Hand dominance: Right  PERTINENT HISTORY:  Anemia; anxiety; HTN; OA right knee  PAIN: 03/15/2024 Are you having pain? Yes: NPRS scale: 8/10 Pain location: C- spine bilateral Lt> Rt  Pain description: tight; uncomfortable Aggravating factors: driving in the car Relieving factors: stretching  PRECAUTIONS: None  RED FLAGS: None     WEIGHT BEARING RESTRICTIONS: No  FALLS:  Has patient fallen in last 6 months? Yes. Number of falls fell walking in the woods. Patient verbalized she does not feel her balance is an issue.  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  DIAGNOSTIC FINDINGS:  IMPRESSION: Multilevel degenerative disc disease with severe narrowing of the C4-C5 disc space.  PATIENT SURVEYS:  Eval:  NDI 8/50 16% 02/19/2024:  Neck Disability Index score: 5 / 50 = 10.0 % 04/01/2024:  Neck Disability Index score: 6 / 50 = 12.0 %  SENSATION: Occasional numbness and tingling in Rt arm  POSTURE: rounded shoulders and forward head  PALPATION: Increased muscle spasms of bilateral upper trap, suboccipitals, and cervical paraspinals  Decreased upper T spine mobility joint mobs Grade III   CERVICAL ROM:   Active ROM A/ROM (deg) eval A/ROM (Deg) 04/01/24  Flexion 60 65  Extension 30 50  Right lateral flexion 32 50  Left lateral flexion 43 50  Right rotation 80 80  Left rotation 85 90   (Blank rows = not tested)  UPPER EXTREMITY ROM: WFL bilateral    UPPER EXTREMITY MMT:  MMT Right eval Left eval  Shoulder flexion 4 4+  Shoulder extension    Shoulder abduction 4+ 4+  Shoulder adduction    Shoulder extension    Shoulder internal rotation    Shoulder external rotation    Middle trapezius    Lower trapezius    Elbow flexion 5 5  Elbow extension 5 5  Wrist flexion    Wrist extension    Wrist ulnar deviation    Wrist radial deviation    Wrist pronation    Wrist supination    Grip strength     (Blank rows = not tested)    FUNCTIONAL TESTS:  Eval:  5 times sit to stand: 11.92 sec no hands  TREATMENT DATE:  04/01/2024: UBE level 1.5 x3 min with PT present to discuss status Seated cervical SNAGs for rotation x10 bilat Standing holding blue tband in shoulder horizontal abduction: performing cervical rotation  2x10 Standing lower trap lift offs 2x10 Standing 3 way scapular stabilization with blue loop 2x5 bilat Supine MELT method with soft foam roll for cervical rotation and cervical flexion/extension.  X10 each Trigger Point Dry Needling Subsequent Treatment: Instructions provided previously at initial dry needling treatment.  Patient Verbal Consent Given: Yes Education Handout Provided: Previously Provided Muscles Treated: bilateral suboccipitals, bilateral upper traps, bilateral cervical multifidi Electrical Stimulation Performed: No Treatment Response/Outcome: Utilized skilled palpation  to identify bony landmarks and trigger points.  Able to illicit twitch response and muscle elongation.  Soft tissue mobilization following to further promote tissue elongation.     03/15/2024: UBE level 2.0 x3 min total (forwards only) PT present to discuss status (stopped because patient's hands were numb) Median nerve glide x 10  Upper trap stretch 2 x 30 sec bilateral  Y's at wall 2 x 10 Plank taps on wall 2 x 10 3 way scap stabilization with blue loop x 8 each direction Iso shoulder abduction against blue loop + shoulder flexion x 10 Thread the needle x 8 each direction Open books x 8 each direction  (foam roll between legs) Hooklying with soft foam roll down spine shoulder ER with red TB 2 x 10 Hookyling with soft foam roll down spine shoulder abduction 2 x 10 Prone with 3# dumbbell: shoulder flexion, abduction, extension, 2 X10 each bilat (attempted abduction but patient had increased pain on Rt ) Cat Cow x 10 Trigger Point Dry Needling  Subsequent Treatment: Instructions provided previously at initial dry needling treatment.   Patient Verbal Consent Given: Yes Education Handout Provided: Previously Provided Muscles Treated: bilateral suboccipitals ; bilateral upper traps Electrical Stimulation Performed: No Treatment Response/Outcome: Utilized skilled palpation to identify trigger points.   During dry needling able to palpate muscle twitch and muscle elongation  Manual: STM to bilateral upper traps and suboccipital release    03/11/2024: UBE level 2.0 x3 min each direction with PT present to discuss status Doorway pec stretch low, mid, high 2x30 sec each Upper trap stretch 2 x 30 sec bilateral  Y's at wall 2 x10 Supine MELT method with soft foam roll for cervical rotation and head nods x20 each Open books x 8 each direction  (foam roll between legs) Cat Cow x 10 Quadruped chin tucks 2 x 10 Prone with 3# dumbbell: shoulder flexion, abduction, extension, rows  X10 each bila (with abduction on Rt no weight thumbs down for middle trap activation)  Manual: STM to bilateral upper traps and suboccipital release    PATIENT EDUCATION:  Education details: POC; DN handout & benefits; HEP; explanation of Xray results Person educated: Patient Education method: Explanation, Demonstration, and Handouts Education comprehension: verbalized understanding, returned demonstration, and needs further education  HOME EXERCISE PROGRAM: Access Code: [email or web address] URL: [email or web address] Date: 02/19/2024 Prepared by: Chaneta Comer Brenin Heidelberger  Exercises - Seated Upper Trapezius Stretch  - 1 x daily - 7 x weekly - 2 sets - 20-30 hold - Seated Levator Scapulae Stretch  - 1 x daily - 7 x weekly - 1 sets - 2 reps - 20-30 hold - Seated Scapular Retraction  - 1 x daily - 7 x weekly - 2 sets - 10 reps - Seated Cervical Retraction  - 1 x daily - 7 x weekly - 2 sets - 10 reps - Doorway Pec Stretch at 90 Degrees Abduction  - 1 x daily - 7 x weekly - 1 sets - 2 reps - 20-30 hold - Standing Low Shoulder Row with Anchored Resistance  - 1 x daily - 7 x weekly - 2 sets - 10 reps - Shoulder Extension with Resistance Hands Down  - 1 x daily - 7 x weekly - 2 sets - 10 reps - Ulnar Nerve Glide- Full Arm  - 1 x daily - 7 x weekly - 2 sets - 10 reps - Shoulder W - External Rotation with Resistance  - 1 x daily  - 7 x weekly - 2  sets - 10 reps - Isometric Standing Shoulder External Rotation in Abduction  - 1 x daily - 7 x weekly - 2 sets - 10 reps - Isometric Standing Shoulder Internal Rotation - 90 Degrees Abduction  - 1 x daily - 7 x weekly - 2 sets - 10 reps - Cervical Retraction Prone on Elbows  - 1 x daily - 7 x weekly - 2 sets - 10 reps - Seated Assisted Cervical Rotation with Towel  - 1 x daily - 7 x weekly - 1-2 sets - 10 reps - Upper Cervical Extension SNAG with Strap  - 1 x daily - 7 x weekly - 1-2 sets - 10 reps - Seated Upper Extremity Nerve Glide  - 1 x daily - 7 x weekly - 1-2 sets - 10 reps   ASSESSMENT:  CLINICAL IMPRESSION: Fredda present to skilled PT after a couple of weeks off secondary to having bronchitis.  She states that she is starting to have more stiffness and pain then she was at her last visit secondary to inactivity from being ill.  Patient did have slightly higher score on NDI than last time checked, but still improved from evaluation.  Additionally, patient with improved cervical ROM.  Patient continues to progress with strengthening and general scapular stability exercises.  Patient continues with tight musculature and with great twitch response noted on dry needling, especially bilateral upper traps.  Patient with further tissue elasticity noted following soft tissue mobilization at end of treatment session.  Patient continues to require skilled PT to progress towards goal related activities, especially given slight regression with recent illness.  Recommend patient continue PT 1-2x/week for 8 weeks to progress towards decreased pain with functional activities.     OBJECTIVE IMPAIRMENTS: decreased ROM, decreased strength, increased muscle spasms, impaired flexibility, postural dysfunction, and pain.    GOALS: Goals reviewed with patient? Yes  SHORT TERM GOALS: Target date: 02/17/2024  Patient will be independent with initial HEP. Baseline:  Goal status: Met   2.   Patient will report > or = to 30% improvement in symptoms since starting PT. Baseline:  Goal status: Met on 02/19/2024  3.  Patient will demonstrate correct seated posture. Baseline:  Goal status: Met on 02/23/24   LONG TERM GOALS: Target date: 05/14/2024  Patient will demonstrate independence in advanced HEP. Baseline:  Goal status: Ongoing  2.  Patient will report > or = to 70% improvement in symptoms since starting PT. Baseline:  Goal status: Ongoing (reported 60% improvement on 02/26/24)  3.  Patient will verbalize and demonstrate self-care strategies to manage pain including tissue mobility practices and change of position. Baseline:  Goal status: Ongoing   4.  Patient will be able to drive and look in her blind spot with < or = to 2/10 neck pain. Baseline:  Goal status: Ongoing (reports that it is better, but she still has tightness on 04/01/24)  5.  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 Baseline:  Goal status: Ongoing (has started volunteering, but still has pain on 04/01/24)     PLAN:  PT FREQUENCY: 1-2x/week  PT DURATION: 8 weeks  PLANNED INTERVENTIONS: 97164- PT Re-evaluation, 97110-Therapeutic exercises, 97530- Therapeutic activity, 97112- Neuromuscular re-education, 97535- Self Care, [id number]- Manual therapy, [id number]- Canalith repositioning, [id number]- Aquatic Therapy, [id number]- Electrical stimulation (unattended), [id number]- Electrical stimulation (manual), [id number]- Vasopneumatic device, [id number]- Ultrasound, [id number]- Traction (mechanical), [id number]- Ionotophoresis 4mg /ml Dexamethasone, Patient/Family education, Taping, Dry Needling,  Joint mobilization, Joint manipulation, Spinal manipulation, Spinal mobilization, Vestibular training, Cryotherapy, and Moist heat  PLAN FOR NEXT SESSION:  Assess and progress HEP as indicated, strengthening, flexibility, manual/dry needling as indicated     Robyne Christen, PT, DPT 04/01/24, 11:49  AM  [facility] [address] [place], [place] [address] Phone # [phone] Fax [phone]

## 2024-04-09 DIAGNOSIS — J301 Allergic rhinitis due to pollen: Secondary | ICD-10-CM | POA: Diagnosis not present

## 2024-04-09 DIAGNOSIS — J3081 Allergic rhinitis due to animal (cat) (dog) hair and dander: Secondary | ICD-10-CM | POA: Diagnosis not present

## 2024-04-09 DIAGNOSIS — J3089 Other allergic rhinitis: Secondary | ICD-10-CM | POA: Diagnosis not present

## 2024-04-14 ENCOUNTER — Ambulatory Visit

## 2024-04-14 DIAGNOSIS — M6281 Muscle weakness (generalized): Secondary | ICD-10-CM

## 2024-04-14 DIAGNOSIS — R252 Cramp and spasm: Secondary | ICD-10-CM | POA: Diagnosis not present

## 2024-04-14 DIAGNOSIS — G8929 Other chronic pain: Secondary | ICD-10-CM | POA: Diagnosis not present

## 2024-04-14 DIAGNOSIS — R293 Abnormal posture: Secondary | ICD-10-CM

## 2024-04-14 DIAGNOSIS — M542 Cervicalgia: Secondary | ICD-10-CM

## 2024-04-14 DIAGNOSIS — M25511 Pain in right shoulder: Secondary | ICD-10-CM | POA: Diagnosis not present

## 2024-04-14 NOTE — Therapy (Signed)
 OUTPATIENT PHYSICAL THERAPY TREATMENT NOTE AND REASSESSMENT NOTE   Patient Name: Maria Goodwin MRN: [id number] DOB:1981-04-21, 43 y.o., female Today's Date: 04/14/2024  END OF SESSION:  PT End of Session - 04/14/24 0851     Visit Number 14    Date for PT Re-Evaluation 05/14/24    Authorization Type [employer] (50vl)    PT Start Time 0848    PT Stop Time 0928    PT Time Calculation (min) 40 min    Activity Tolerance Patient tolerated treatment well    Behavior During Therapy [facility] for tasks assessed/performed                 Past Medical History:  Diagnosis Date   Allergic reaction 02/23/2020   Itchy mouth with watermelon and cucumber.  Refer to allergist.   Allergic rhinitis    Allergy    Anemia 2021   Anxiety Since childhood   Blood in stool 2010   Patient reports hospitalization for blood in stool, diarrhea and extreme dehydration.  She was hospitalized at this time and a colonoscopy was completed.   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   Frequent headaches    GERD (gastroesophageal reflux disease)    Heart murmur    Hypertension    Iron  deficiency anemia    Irregular menstrual bleeding 2015   Numbness and tingling of upper extremity 02/23/2020   Osteoarthritis of knee 2016   Other allergic rhinitis 03/23/2020   Patellofemoral pain syndrome 04/2015   Tinea corporis    Tonsil stone 2016   Vitamin D  deficiency    Past Surgical History:  Procedure Laterality Date   BREAST BIOPSY Left 08/2016   fibroadenoma   BREAST BIOPSY Left 01/15/2021   BREAST BIOPSY Left 2023   CESAREAN SECTION  2012   WISDOM TOOTH EXTRACTION     Patient Active Problem List   Diagnosis Date Noted   GERD (gastroesophageal reflux disease) 01/14/2024   Encounter for breast cancer screening other than mammogram 01/14/2024   Neck pain 01/14/2024   Iron  deficiency 07/30/2023   Pharyngoesophageal dysphagia  07/30/2023   Vitamin D  deficiency 10/15/2021   Obstructive sleep apnea 05/18/2021   Abnormal mammogram 12/28/2020   Palpitations 06/13/2020   Severe menstrual cramps 06/13/2020   Menorrhagia with regular cycle 06/13/2020   Pollen-food allergy 03/23/2020   Hypertension 02/22/2020   E66.9 (BMI 30-39.9) 02/22/2020    PCP: Mariel Shope, DO  REFERRING PROVIDER: Mariel Shope, DO  REFERRING DIAG: M50.30 (ICD-10-CM) - DDD (degenerative disc disease), cervical M99.79 (ICD-10-CM) - Narrowing of intervertebral disc space M54.2 (ICD-10-CM) - Neck pain  THERAPY DIAG:  Cervicalgia  Chronic right shoulder pain  Muscle weakness (generalized)  Cramp and spasm  Abnormal posture  Rationale for Evaluation and Treatment: Rehabilitation  ONSET DATE: Chronic  SUBJECTIVE:  SUBJECTIVE STATEMENT: Patient states that she did not feel well enough to do all of her exercises while she was sick with bronchitis.  States that her neck is feeling stiff, but her nerve pain has improved.  Hand dominance: Right  PERTINENT HISTORY:  Anemia; anxiety; HTN; OA right knee  PAIN: 03/15/2024 Are you having pain? Yes: NPRS scale: 8/10 Pain location: C- spine bilateral Lt> Rt  Pain description: tight; uncomfortable Aggravating factors: driving in the car Relieving factors: stretching  PRECAUTIONS: None  RED FLAGS: None     WEIGHT BEARING RESTRICTIONS: No  FALLS:  Has patient fallen in last 6 months? Yes. Number of falls fell walking in the woods. Patient verbalized she does not feel her balance is an issue.  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  DIAGNOSTIC FINDINGS:  IMPRESSION: Multilevel degenerative disc disease with severe narrowing of the C4-C5 disc space.  PATIENT SURVEYS:  Eval:  NDI 8/50 16% 02/19/2024:  Neck Disability Index score: 5 / 50 = 10.0 % 04/01/2024:  Neck Disability Index score: 6 / 50 = 12.0 %  SENSATION: Occasional numbness and tingling in Rt arm  POSTURE: rounded shoulders and forward head  PALPATION: Increased muscle spasms of bilateral upper trap, suboccipitals, and cervical paraspinals  Decreased upper T spine mobility joint mobs Grade III   CERVICAL ROM:   Active ROM A/ROM (deg) eval A/ROM (Deg) 04/01/24  Flexion 60 65  Extension 30 50  Right lateral flexion 32 50  Left lateral flexion 43 50  Right rotation 80 80  Left rotation 85 90   (Blank rows = not tested)  UPPER EXTREMITY ROM: WFL bilateral    UPPER EXTREMITY MMT:  MMT Right eval Left eval  Shoulder flexion 4 4+  Shoulder extension    Shoulder abduction 4+ 4+  Shoulder adduction    Shoulder extension    Shoulder internal rotation    Shoulder external rotation    Middle trapezius    Lower trapezius    Elbow flexion 5 5  Elbow extension 5 5  Wrist flexion    Wrist extension    Wrist ulnar deviation    Wrist radial deviation    Wrist pronation    Wrist supination    Grip strength     (Blank rows = not tested)    FUNCTIONAL TESTS:  Eval:  5 times sit to stand: 11.92 sec no hands  TREATMENT DATE:  04/14/2024: UBE level 1.0 x 6 min (3/3) with PT present to discuss status 3 way scapular stabilization with blue loop x 10 each side 4D ball rolls with 2 lb (light blue) plyo ball x 20 each  direction on each UE Prone shoulder extension with 2 lbs 2 x 10 both Prone shoulder row with 2 lbs 2 x 10 both Prone shoulder horizontal abduction with 2 lbs 2 x 10 both Side lying ER with 2 lbs 2 x 10 both Supine serratus punch with 2 lbs x 20 both Trigger Point Dry Needling Subsequent Treatment: Instructions provided previously at initial dry needling treatment.  Patient Verbal Consent Given: Yes Education Handout Provided: Previously Provided Muscles Treated: bilateral suboccipitals, bilateral upper traps, bilateral cervical multifidi Electrical Stimulation Performed: No Treatment Response/Outcome: Skilled palpation used to identify taut bands and trigger points.  Once identified, dry needling techniques used to treat these areas.  Twitch response ellicited along with palpable elongation of muscle.  Following treatment, patient reported relief of    04/01/2024: UBE level 1.5 x3 min with PT present to discuss status Seated cervical SNAGs for rotation x10 bilat Standing holding blue tband in shoulder horizontal abduction: performing cervical rotation 2x10 Standing lower trap lift offs 2x10 Standing 3 way scapular stabilization with blue loop 2x5 bilat Supine MELT method with soft foam roll for cervical rotation and cervical flexion/extension.  X10 each Trigger Point Dry Needling Subsequent Treatment: Instructions provided previously at initial dry needling treatment.  Patient Verbal Consent Given: Yes Education Handout Provided: Previously Provided Muscles Treated: bilateral suboccipitals, bilateral upper traps, bilateral cervical multifidi Electrical Stimulation Performed: No Treatment Response/Outcome: Utilized skilled palpation to identify bony landmarks and trigger points.  Able to illicit twitch response and muscle elongation.  Soft tissue mobilization following to further promote tissue elongation.     03/15/2024: UBE level 2.0 x3 min total (forwards only) PT present to discuss  status (stopped because patient's hands were numb) Median nerve glide x 10  Upper trap stretch 2 x 30 sec bilateral  Y's at wall 2 x 10 Plank taps on wall 2 x 10 3 way scap stabilization with blue loop x 8 each direction Iso shoulder abduction against blue loop + shoulder flexion x 10 Thread the needle x 8 each direction Open books x 8 each direction  (foam roll between legs) Hooklying with soft foam roll down spine shoulder ER with red TB 2 x 10 Hookyling with soft foam roll down spine shoulder abduction 2 x 10 Prone with 3# dumbbell: shoulder flexion, abduction, extension, 2 X10 each bilat (attempted abduction but patient had increased pain on Rt ) Cat Cow x 10 Trigger Point Dry Needling Subsequent Treatment: Instructions provided previously at initial dry needling treatment.  Patient Verbal Consent Given: Yes Education Handout Provided: Previously Provided Muscles Treated: bilateral suboccipitals ; bilateral upper traps Electrical Stimulation Performed: No Treatment Response/Outcome: Utilized skilled palpation to identify trigger points.  During dry needling able to palpate muscle twitch and muscle elongation  Manual: STM to bilateral upper traps and suboccipital release   PATIENT EDUCATION:  Education details: POC; DN handout & benefits; HEP; explanation of Xray results Person educated: Patient Education method: Explanation, Demonstration, and Handouts Education comprehension: verbalized understanding, returned demonstration, and needs further education  HOME EXERCISE PROGRAM: Access Code: [email or web address] URL: [email or web address] Date: 04/14/2024 Prepared by: Aletha Anderson  Exercises - Seated Upper Trapezius Stretch  - 1 x daily - 7 x weekly - 2 sets - 20-30 hold - Seated Upper Trapezius Stretch (Mirrored)  - 1 x daily - 7 x weekly - 2 sets - 20-30 hold - Seated Levator Scapulae Stretch  - 1 x daily - 7 x weekly - 1 sets - 2 reps - 20-30 hold - Seated Levator  Scapulae Stretch (Mirrored)  - 1 x daily - 7 x weekly - 1 sets - 2 reps - 20-30 hold - Seated Scapular Retraction  - 1 x daily - 7 x weekly - 2 sets - 10 reps - Seated Cervical Retraction  - 1 x daily - 7 x weekly - 2 sets - 10 reps - Doorway Pec Stretch at 90  Degrees Abduction  - 1 x daily - 7 x weekly - 1 sets - 2 reps - 20-30 hold - Standing Low Shoulder Row with Anchored Resistance  - 1 x daily - 7 x weekly - 2 sets - 10 reps - Shoulder Extension with Resistance Hands Down  - 1 x daily - 7 x weekly - 2 sets - 10 reps - Ulnar Nerve Glide- Full Arm  - 1 x daily - 7 x weekly - 2 sets - 10 reps - Ulnar Nerve Glide- Full Arm (Mirrored)  - 1 x daily - 7 x weekly - 2 sets - 10 reps - Shoulder W - External Rotation with Resistance  - 1 x daily - 7 x weekly - 2 sets - 10 reps - Isometric Standing Shoulder External Rotation in Abduction  - 1 x daily - 7 x weekly - 2 sets - 10 reps - Isometric Standing Shoulder External Rotation in Abduction (Mirrored)  - 1 x daily - 7 x weekly - 2 sets - 10 reps - Isometric Standing Shoulder Internal Rotation - 90 Degrees Abduction  - 1 x daily - 7 x weekly - 2 sets - 10 reps - Isometric Standing Shoulder Internal Rotation - 90 Degrees Abduction (Mirrored)  - 1 x daily - 7 x weekly - 2 sets - 10 reps - Cervical Retraction Prone on Elbows  - 1 x daily - 7 x weekly - 2 sets - 10 reps - Seated Assisted Cervical Rotation with Towel  - 1 x daily - 7 x weekly - 1-2 sets - 10 reps - Seated Assisted Cervical Rotation with Towel (Mirrored)  - 1 x daily - 7 x weekly - 1-2 sets - 10 reps - Upper Cervical Extension SNAG with Strap  - 1 x daily - 7 x weekly - 1-2 sets - 10 reps - Seated Upper Extremity Nerve Glide  - 1 x daily - 7 x weekly - 1-2 sets - 10 reps - Seated Upper Extremity Nerve Glide (Mirrored)  - 1 x daily - 7 x weekly - 1-2 sets - 10 reps - Prone Shoulder Extension - Single Arm  - 2 x daily - 7 x weekly - 2 sets - 10 reps - Prone Shoulder Row  - 2 x daily - 7 x  weekly - 2 sets - 10 reps - Prone Single Arm Shoulder Horizontal Abduction with Scapular Retraction and Palm Down  - 2 x daily - 7 x weekly - 2 sets - 10 reps - Sidelying Shoulder External Rotation  - 2 x daily - 7 x weekly - 2 sets - 10 reps - Single Arm Serratus Punches in Supine with Dumbbell  - 2 x daily - 7 x weekly - 2 sets - 10 reps  ASSESSMENT:  CLINICAL IMPRESSION: Ellie is making slow progress but does report that she is about 60% better.  We incorporated some isolated shoulder strengthening and scapular stabilization today.  We added this to HEP.  She continues to experience "hand falling asleep" in certain positions.  This resolves easily with change in position.  She is well motivated and compliant.  She may benefit from adding in cervical traction as well as continuing current POC.  She would benefit from continuing skilled PT to work toward goals below.   OBJECTIVE IMPAIRMENTS: decreased ROM, decreased strength, increased muscle spasms, impaired flexibility, postural dysfunction, and pain.    GOALS: Goals reviewed with patient? Yes  SHORT TERM GOALS: Target date: 02/17/2024  Patient will  be independent with initial HEP. Baseline:  Goal status: Met   2.  Patient will report > or = to 30% improvement in symptoms since starting PT. Baseline:  Goal status: Met on 02/19/2024  3.  Patient will demonstrate correct seated posture. Baseline:  Goal status: Met on 02/23/24   LONG TERM GOALS: Target date: 05/14/2024  Patient will demonstrate independence in advanced HEP. Baseline:  Goal status: Ongoing  2.  Patient will report > or = to 70% improvement in symptoms since starting PT. Baseline:  Goal status: Ongoing (reported 60% improvement on 02/26/24)  3.  Patient will verbalize and demonstrate self-care strategies to manage pain including tissue mobility practices and change of position. Baseline:  Goal status: Ongoing   4.  Patient will be able to drive and look in her  blind spot with < or = to 2/10 neck pain. Baseline:  Goal status: Ongoing (reports that it is better, but she still has tightness on 04/01/24)  5.  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 Baseline:  Goal status: Ongoing (has started volunteering, but still has pain on 04/01/24)     PLAN:  PT FREQUENCY: 1-2x/week  PT DURATION: 8 weeks  PLANNED INTERVENTIONS: 97164- PT Re-evaluation, 97110-Therapeutic exercises, 97530- Therapeutic activity, 97112- Neuromuscular re-education, 97535- Self Care, 42595- Manual therapy, [id number]- Canalith repositioning, [id number]- Aquatic Therapy, [id number]- Electrical stimulation (unattended), [id number]- Electrical stimulation (manual), [id number]- Vasopneumatic device, [id number]- Ultrasound, [id number]- Traction (mechanical), [id number]- Ionotophoresis 4mg /ml Dexamethasone, Patient/Family education, Taping, Dry Needling, Joint mobilization, Joint manipulation, Spinal manipulation, Spinal mobilization, Vestibular training, Cryotherapy, and Moist heat  PLAN FOR NEXT SESSION:  Assess and progress HEP as indicated, possibly add in traction, continue postural and shoulder strengthening, flexibility, manual/dry needling as indicated    Savalas Monje B. Georgene Kopper, PT 04/14/24 9:37 AM [facility] [address], [address] [place], [place] [address] Phone # [phone] Fax [phone]

## 2024-04-15 ENCOUNTER — Other Ambulatory Visit: Payer: Self-pay | Admitting: Family Medicine

## 2024-04-15 ENCOUNTER — Ambulatory Visit
Admission: RE | Admit: 2024-04-15 | Discharge: 2024-04-15 | Disposition: A | Discharge status: Home / Self Care | Source: Ambulatory Visit | Attending: Family Medicine | Admitting: Family Medicine

## 2024-04-15 DIAGNOSIS — R928 Other abnormal and inconclusive findings on diagnostic imaging of breast: Secondary | ICD-10-CM

## 2024-04-15 DIAGNOSIS — N6320 Unspecified lump in the left breast, unspecified quadrant: Secondary | ICD-10-CM

## 2024-04-15 DIAGNOSIS — N6321 Unspecified lump in the left breast, upper outer quadrant: Secondary | ICD-10-CM | POA: Diagnosis not present

## 2024-04-15 DIAGNOSIS — R922 Inconclusive mammogram: Secondary | ICD-10-CM | POA: Diagnosis not present

## 2024-04-16 ENCOUNTER — Ambulatory Visit: Admitting: Physical Therapy

## 2024-04-16 ENCOUNTER — Ambulatory Visit
Admission: RE | Admit: 2024-04-16 | Discharge: 2024-04-16 | Disposition: A | Discharge status: Home / Self Care | Source: Ambulatory Visit | Attending: Family Medicine | Admitting: Family Medicine

## 2024-04-16 ENCOUNTER — Encounter: Payer: Self-pay | Admitting: Physical Therapy

## 2024-04-16 DIAGNOSIS — R252 Cramp and spasm: Secondary | ICD-10-CM

## 2024-04-16 DIAGNOSIS — M6281 Muscle weakness (generalized): Secondary | ICD-10-CM

## 2024-04-16 DIAGNOSIS — G8929 Other chronic pain: Secondary | ICD-10-CM | POA: Diagnosis not present

## 2024-04-16 DIAGNOSIS — N6012 Diffuse cystic mastopathy of left breast: Secondary | ICD-10-CM | POA: Diagnosis not present

## 2024-04-16 DIAGNOSIS — N6320 Unspecified lump in the left breast, unspecified quadrant: Secondary | ICD-10-CM

## 2024-04-16 DIAGNOSIS — M542 Cervicalgia: Secondary | ICD-10-CM

## 2024-04-16 DIAGNOSIS — R293 Abnormal posture: Secondary | ICD-10-CM | POA: Diagnosis not present

## 2024-04-16 DIAGNOSIS — M25511 Pain in right shoulder: Secondary | ICD-10-CM | POA: Diagnosis not present

## 2024-04-16 DIAGNOSIS — N6321 Unspecified lump in the left breast, upper outer quadrant: Secondary | ICD-10-CM | POA: Diagnosis not present

## 2024-04-16 DIAGNOSIS — R92332 Mammographic heterogeneous density, left breast: Secondary | ICD-10-CM | POA: Diagnosis not present

## 2024-04-16 HISTORY — PX: BREAST BIOPSY: SHX20

## 2024-04-16 NOTE — Therapy (Signed)
 OUTPATIENT PHYSICAL THERAPY TREATMENT NOTE    Patient Name: Maria Goodwin MRN: [id number] DOB:June 17, 1981, 43 y.o., female Today's Date: 04/16/2024  END OF SESSION:  PT End of Session - 04/16/24 1029     Visit Number 15    Date for PT Re-Evaluation 05/14/24    Authorization Type [employer] (50vl)    PT Start Time [phone]    PT Stop Time 1017    PT Time Calculation (min) 49 min    Activity Tolerance Patient tolerated treatment well    Behavior During Therapy [facility] for tasks assessed/performed                  Past Medical History:  Diagnosis Date   Allergic reaction 02/23/2020   Itchy mouth with watermelon and cucumber.  Refer to allergist.   Allergic rhinitis    Allergy    Anemia 2021   Anxiety Since childhood   Blood in stool 2010   Patient reports hospitalization for blood in stool, diarrhea and extreme dehydration.  She was hospitalized at this time and a colonoscopy was completed.   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   Frequent headaches    GERD (gastroesophageal reflux disease)    Heart murmur    Hypertension    Iron  deficiency anemia    Irregular menstrual bleeding 2015   Numbness and tingling of upper extremity 02/23/2020   Osteoarthritis of knee 2016   Other allergic rhinitis 03/23/2020   Patellofemoral pain syndrome 04/2015   Tinea corporis    Tonsil stone 2016   Vitamin D  deficiency    Past Surgical History:  Procedure Laterality Date   BREAST BIOPSY Left 08/2016   fibroadenoma   BREAST BIOPSY Left 01/15/2021   BREAST BIOPSY Left 2023   CESAREAN SECTION  2012   WISDOM TOOTH EXTRACTION     Patient Active Problem List   Diagnosis Date Noted   GERD (gastroesophageal reflux disease) 01/14/2024   Encounter for breast cancer screening other than mammogram 01/14/2024   Neck pain 01/14/2024   Iron  deficiency 07/30/2023   Pharyngoesophageal dysphagia 07/30/2023   Vitamin  D deficiency 10/15/2021   Obstructive sleep apnea 05/18/2021   Abnormal mammogram 12/28/2020   Palpitations 06/13/2020   Severe menstrual cramps 06/13/2020   Menorrhagia with regular cycle 06/13/2020   Pollen-food allergy 03/23/2020   Hypertension 02/22/2020   E66.9 (BMI 30-39.9) 02/22/2020    PCP: Mariel Shope, DO  REFERRING PROVIDER: Mariel Shope, DO  REFERRING DIAG: M50.30 (ICD-10-CM) - DDD (degenerative disc disease), cervical M99.79 (ICD-10-CM) - Narrowing of intervertebral disc space M54.2 (ICD-10-CM) - Neck pain  THERAPY DIAG:  Cervicalgia  Chronic right shoulder pain  Muscle weakness (generalized)  Cramp and spasm  Abnormal posture  Rationale for Evaluation and Treatment: Rehabilitation  ONSET DATE: Chronic  SUBJECTIVE:  SUBJECTIVE STATEMENT: Patient reports she felt good after dry needling last session. She is not currently having any pain but some tightness.  Hand dominance: Right  PERTINENT HISTORY:  Anemia; anxiety; HTN; OA right knee  PAIN: 04/16/2024 Are you having pain? Yes: NPRS scale: 0/10 Pain location: C- spine bilateral Lt> Rt  Pain description: tight; uncomfortable Aggravating factors: driving in the car Relieving factors: stretching  PRECAUTIONS: None  RED FLAGS: None     WEIGHT BEARING RESTRICTIONS: No  FALLS:  Has patient fallen in last 6 months? Yes. Number of falls fell walking in the woods. Patient verbalized she does not feel her balance is an issue.  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  DIAGNOSTIC FINDINGS:  IMPRESSION: Multilevel degenerative disc disease with severe narrowing of the C4-C5 disc space.  PATIENT SURVEYS:  Eval:  NDI 8/50 16% 02/19/2024:  Neck Disability Index score: 5 / 50 = 10.0 % 04/01/2024:  Neck Disability Index score: 6 / 50 = 12.0 %  SENSATION: Occasional numbness and tingling in Rt arm  POSTURE: rounded shoulders and forward head  PALPATION: Increased muscle spasms of bilateral upper trap, suboccipitals, and cervical paraspinals  Decreased upper T spine mobility joint mobs Grade III   CERVICAL ROM:   Active ROM A/ROM (deg) eval A/ROM (Deg) 04/01/24  Flexion 60 65  Extension 30 50  Right lateral flexion 32 50  Left lateral flexion 43 50  Right rotation 80 80  Left rotation 85 90   (Blank rows = not tested)  UPPER EXTREMITY ROM: WFL bilateral    UPPER EXTREMITY MMT:  MMT Right eval Left eval  Shoulder flexion 4 4+  Shoulder extension    Shoulder abduction 4+ 4+  Shoulder adduction    Shoulder extension    Shoulder internal rotation    Shoulder external rotation    Middle trapezius    Lower trapezius    Elbow flexion 5 5  Elbow extension 5 5  Wrist flexion    Wrist extension    Wrist ulnar deviation    Wrist radial deviation    Wrist pronation    Wrist supination    Grip strength     (Blank rows = not tested)    FUNCTIONAL TESTS:  Eval:  5 times sit to stand: 11.92 sec no hands  TREATMENT DATE:  04/16/2024: UBE level 2.0 x 6 min (3/3) with PT present to discuss status 3 way scapular stabilization with blue loop x 10 each side 4D ball rolls with 2 lb (light blue) plyo ball x 20 each direction on each UE Y's at wall x 10 Standing [place] x 10 each direction Thread the  needle x 8 each side Prone shoulder extension with 2 lbs 2 x 10 both Prone shoulder row with 2 lbs 2 x 10 both Prone shoulder horizontal abduction with 2 lbs 2 x 10 both Side lying ER with 2 lbs 2 x 10 both Cervical Mechanical traction 15 mins 15lbs force     04/14/2024: UBE level 1.0 x 6 min (3/3)  with PT present to discuss status 3 way scapular stabilization with blue loop x 10 each side 4D ball rolls with 2 lb (light blue) plyo ball x 20 each direction on each UE Prone shoulder extension with 2 lbs 2 x 10 both Prone shoulder row with 2 lbs 2 x 10 both Prone shoulder horizontal abduction with 2 lbs 2 x 10 both Side lying ER with 2 lbs 2 x 10 both Supine serratus punch with 2 lbs x 20 both Trigger Point Dry Needling Subsequent Treatment: Instructions provided previously at initial dry needling treatment.  Patient Verbal Consent Given: Yes Education Handout Provided: Previously Provided Muscles Treated: bilateral suboccipitals, bilateral upper traps, bilateral cervical multifidi Electrical Stimulation Performed: No Treatment Response/Outcome: Skilled palpation used to identify taut bands and trigger points.  Once identified, dry needling techniques used to treat these areas.  Twitch response ellicited along with palpable elongation of muscle.  Following treatment, patient reported relief of    04/01/2024: UBE level 1.5 x3 min with PT present to discuss status Seated cervical SNAGs for rotation x10 bilat Standing holding blue tband in shoulder horizontal abduction: performing cervical rotation 2x10 Standing lower trap lift offs 2x10 Standing 3 way scapular stabilization with blue loop 2x5 bilat Supine MELT method with soft foam roll for cervical rotation and cervical flexion/extension.  X10 each Trigger Point Dry Needling Subsequent Treatment: Instructions provided previously at initial dry needling treatment.  Patient Verbal Consent Given: Yes Education Handout Provided: Previously  Provided Muscles Treated: bilateral suboccipitals, bilateral upper traps, bilateral cervical multifidi Electrical Stimulation Performed: No Treatment Response/Outcome: Utilized skilled palpation to identify bony landmarks and trigger points.  Able to illicit twitch response and muscle elongation.  Soft tissue mobilization following to further promote tissue elongation.      PATIENT EDUCATION:  Education details: POC; DN handout & benefits; HEP; explanation of Xray results Person educated: Patient Education method: Explanation, Demonstration, and Handouts Education comprehension: verbalized understanding, returned demonstration, and needs further education  HOME EXERCISE PROGRAM: Access Code: [email or web address] URL: [email or web address] Date: 04/14/2024 Prepared by: Aletha Anderson  Exercises - Seated Upper Trapezius Stretch  - 1 x daily - 7 x weekly - 2 sets - 20-30 hold - Seated Upper Trapezius Stretch (Mirrored)  - 1 x daily - 7 x weekly - 2 sets - 20-30 hold - Seated Levator Scapulae Stretch  - 1 x daily - 7 x weekly - 1 sets - 2 reps - 20-30 hold - Seated Levator Scapulae Stretch (Mirrored)  - 1 x daily - 7 x weekly - 1 sets - 2 reps - 20-30 hold - Seated Scapular Retraction  - 1 x daily - 7 x weekly - 2 sets - 10 reps - Seated Cervical Retraction  - 1 x daily - 7 x weekly - 2 sets - 10 reps - Doorway Pec Stretch at 90 Degrees Abduction  - 1 x daily - 7 x weekly - 1 sets - 2 reps - 20-30 hold - Standing Low Shoulder Row with Anchored Resistance  - 1 x daily - 7 x weekly - 2 sets - 10 reps - Shoulder Extension with Resistance Hands Down  - 1 x daily - 7 x weekly - 2 sets - 10 reps - Ulnar Nerve Glide- Full Arm  - 1 x daily - 7 x weekly - 2 sets - 10 reps - Ulnar Nerve Glide- Full Arm (Mirrored)  - 1 x daily - 7 x weekly - 2 sets - 10 reps - Shoulder W -  External Rotation with Resistance  - 1 x daily - 7 x weekly - 2 sets - 10 reps - Isometric Standing Shoulder External  Rotation in Abduction  - 1 x daily - 7 x weekly - 2 sets - 10 reps - Isometric Standing Shoulder External Rotation in Abduction (Mirrored)  - 1 x daily - 7 x weekly - 2 sets - 10 reps - Isometric Standing Shoulder Internal Rotation - 90 Degrees Abduction  - 1 x daily - 7 x weekly - 2 sets - 10 reps - Isometric Standing Shoulder Internal Rotation - 90 Degrees Abduction (Mirrored)  - 1 x daily - 7 x weekly - 2 sets - 10 reps - Cervical Retraction Prone on Elbows  - 1 x daily - 7 x weekly - 2 sets - 10 reps - Seated Assisted Cervical Rotation with Towel  - 1 x daily - 7 x weekly - 1-2 sets - 10 reps - Seated Assisted Cervical Rotation with Towel (Mirrored)  - 1 x daily - 7 x weekly - 1-2 sets - 10 reps - Upper Cervical Extension SNAG with Strap  - 1 x daily - 7 x weekly - 1-2 sets - 10 reps - Seated Upper Extremity Nerve Glide  - 1 x daily - 7 x weekly - 1-2 sets - 10 reps - Seated Upper Extremity Nerve Glide (Mirrored)  - 1 x daily - 7 x weekly - 1-2 sets - 10 reps - Prone Shoulder Extension - Single Arm  - 2 x daily - 7 x weekly - 2 sets - 10 reps - Prone Shoulder Row  - 2 x daily - 7 x weekly - 2 sets - 10 reps - Prone Single Arm Shoulder Horizontal Abduction with Scapular Retraction and Palm Down  - 2 x daily - 7 x weekly - 2 sets - 10 reps - Sidelying Shoulder External Rotation  - 2 x daily - 7 x weekly - 2 sets - 10 reps - Single Arm Serratus Punches in Supine with Dumbbell  - 2 x daily - 7 x weekly - 2 sets - 10 reps  ASSESSMENT:  CLINICAL IMPRESSION: Jace presents to therapy with no pain today, but verbalized tightness in her upper trap area. She verbalized relief from dry needling last session. She tolerated scapular stabilization and postural strengthening exercises well. She required minimal verbal cues for form correction. She verbalized some hand numbness after being on the UBE for 5 mins.  Patient will benefit from skilled PT to address the below impairments and improve overall  function.   OBJECTIVE IMPAIRMENTS: decreased ROM, decreased strength, increased muscle spasms, impaired flexibility, postural dysfunction, and pain.    GOALS: Goals reviewed with patient? Yes  SHORT TERM GOALS: Target date: 02/17/2024  Patient will be independent with initial HEP. Baseline:  Goal status: Met   2.  Patient will report > or = to 30% improvement in symptoms since starting PT. Baseline:  Goal status: Met on 02/19/2024  3.  Patient will demonstrate correct seated posture. Baseline:  Goal status: Met on 02/23/24   LONG TERM GOALS: Target date: 05/14/2024  Patient will demonstrate independence in advanced HEP. Baseline:  Goal status: Ongoing  2.  Patient will report > or = to 70% improvement in symptoms since starting PT. Baseline:  Goal status: Ongoing (reported 60% improvement on 02/26/24)  3.  Patient will verbalize and demonstrate self-care strategies to manage pain including tissue mobility practices and change of position. Baseline:  Goal status: Ongoing  4.  Patient will be able to drive and look in her blind spot with < or = to 2/10 neck pain. Baseline:  Goal status: Ongoing (reports that it is better, but she still has tightness on 04/01/24)  5.  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 Baseline:  Goal status: Ongoing (has started volunteering, but still has pain on 04/01/24)     PLAN:  PT FREQUENCY: 1-2x/week  PT DURATION: 8 weeks  PLANNED INTERVENTIONS: 97164- PT Re-evaluation, 97110-Therapeutic exercises, 97530- Therapeutic activity, 97112- Neuromuscular re-education, 97535- Self Care, [id number]- Manual therapy, [phone]- Canalith repositioning, [id number]- Aquatic Therapy, [id number]- Electrical stimulation (unattended), [phone]- Electrical stimulation (manual), 97016- Vasopneumatic device, [id number]- Ultrasound, [id number]- Traction (mechanical), [id number]- Ionotophoresis 4mg /ml Dexamethasone, Patient/Family education, Taping,  Dry Needling, Joint mobilization, Joint manipulation, Spinal manipulation, Spinal mobilization, Vestibular training, Cryotherapy, and Moist heat  PLAN FOR NEXT SESSION:  Assess response to traction; continue postural and shoulder strengthening, flexibility, manual/dry needling as indicated    Penelope Bowie, PT 04/16/24 10:30 AM [facility] [address], [address] [place], [place] [address] Phone # [phone] Fax [phone]

## 2024-04-19 LAB — SURGICAL PATHOLOGY

## 2024-04-20 ENCOUNTER — Telehealth: Payer: Self-pay

## 2024-04-20 ENCOUNTER — Ambulatory Visit: Attending: Family Medicine

## 2024-04-20 DIAGNOSIS — M25511 Pain in right shoulder: Secondary | ICD-10-CM | POA: Insufficient documentation

## 2024-04-20 DIAGNOSIS — G8929 Other chronic pain: Secondary | ICD-10-CM | POA: Insufficient documentation

## 2024-04-20 DIAGNOSIS — R293 Abnormal posture: Secondary | ICD-10-CM | POA: Insufficient documentation

## 2024-04-20 DIAGNOSIS — M542 Cervicalgia: Secondary | ICD-10-CM | POA: Insufficient documentation

## 2024-04-20 DIAGNOSIS — R252 Cramp and spasm: Secondary | ICD-10-CM | POA: Insufficient documentation

## 2024-04-20 DIAGNOSIS — M6281 Muscle weakness (generalized): Secondary | ICD-10-CM | POA: Insufficient documentation

## 2024-04-20 NOTE — Telephone Encounter (Signed)
 Call placed to patient at 900 am.  Appt at 8:45 am.  Left message to let patient know that she had appt this morning at 8:45 am and to call and let us  know if she is ok and if she will make her next appt on Thursday at 8:45 am.  Attendance policy reviewed and call back number provided.

## 2024-04-22 ENCOUNTER — Ambulatory Visit

## 2024-04-22 DIAGNOSIS — G8929 Other chronic pain: Secondary | ICD-10-CM | POA: Diagnosis not present

## 2024-04-22 DIAGNOSIS — M542 Cervicalgia: Secondary | ICD-10-CM | POA: Diagnosis not present

## 2024-04-22 DIAGNOSIS — R293 Abnormal posture: Secondary | ICD-10-CM

## 2024-04-22 DIAGNOSIS — R252 Cramp and spasm: Secondary | ICD-10-CM | POA: Diagnosis not present

## 2024-04-22 DIAGNOSIS — M6281 Muscle weakness (generalized): Secondary | ICD-10-CM | POA: Diagnosis not present

## 2024-04-22 DIAGNOSIS — M25511 Pain in right shoulder: Secondary | ICD-10-CM | POA: Diagnosis not present

## 2024-04-22 NOTE — Therapy (Signed)
 OUTPATIENT PHYSICAL THERAPY TREATMENT NOTE    Patient Name: Maria Goodwin MRN: [id number] DOB:[birth year], 43 y.o., female Today's Date: 04/22/2024  END OF SESSION:  PT End of Session - 04/22/24 0832     Visit Number 16    Date for PT Re-Evaluation 05/14/24    Authorization Type BCBS (50vl)    PT Start Time 0840    PT Stop Time 0930    PT Time Calculation (min) 50 min    Activity Tolerance Patient tolerated treatment well    Behavior During Therapy [facility] for tasks assessed/performed                  Past Medical History:  Diagnosis Date   Allergic reaction 02/23/2020   Itchy mouth with watermelon and cucumber.  Refer to allergist.   Allergic rhinitis    Allergy    Anemia 2021   Anxiety Since childhood   Blood in stool 2010   Patient reports hospitalization for blood in stool, diarrhea and extreme dehydration.  She was hospitalized at this time and a colonoscopy was completed.   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   Frequent headaches    GERD (gastroesophageal reflux disease)    Heart murmur    Hypertension    Iron  deficiency anemia    Irregular menstrual bleeding 2015   Numbness and tingling of upper extremity 02/23/2020   Osteoarthritis of knee 2016   Other allergic rhinitis 03/23/2020   Patellofemoral pain syndrome 04/2015   Tinea corporis    Tonsil stone 2016   Vitamin D  deficiency    Past Surgical History:  Procedure Laterality Date   BREAST BIOPSY Left 08/2016   fibroadenoma   BREAST BIOPSY Left 01/15/2021   BREAST BIOPSY Left 2023   BREAST BIOPSY Left 04/16/2024   US  LT BREAST BX W LOC DEV 1ST LESION IMG BX SPEC US  GUIDE 04/16/2024 [facility] MAMMOGRAPHY   CESAREAN SECTION  2012   WISDOM TOOTH EXTRACTION     Patient Active Problem List   Diagnosis Date Noted   GERD (gastroesophageal reflux disease) 01/14/2024   Encounter for breast cancer screening other than  mammogram 01/14/2024   Neck pain 01/14/2024   Iron  deficiency 07/30/2023   Pharyngoesophageal dysphagia 07/30/2023   Vitamin D  deficiency 10/15/2021   Obstructive sleep apnea 05/18/2021   Abnormal mammogram 12/28/2020   Palpitations 06/13/2020   Severe menstrual cramps 06/13/2020   Menorrhagia with regular cycle 06/13/2020   Pollen-food allergy 03/23/2020   Hypertension 02/22/2020   E66.9 (BMI 30-39.9) 02/22/2020    PCP: Mariel Shope, DO  REFERRING PROVIDER: Mariel Shope, DO  REFERRING DIAG: M50.30 (ICD-10-CM) - DDD (degenerative disc disease), cervical M99.79 (ICD-10-CM) - Narrowing of intervertebral disc space M54.2 (ICD-10-CM) - Neck pain  THERAPY DIAG:  Cervicalgia  Chronic right shoulder pain  Cramp and spasm  Muscle weakness (generalized)  Abnormal posture  Rationale for Evaluation and Treatment: Rehabilitation  ONSET DATE: Chronic  SUBJECTIVE:  SUBJECTIVE STATEMENT: Patient reports she had relief with the traction last visit.  She feels that the numbness in the hand is more intermittent now and that the shoulder exercises made her shoulder feel a lot better.    Hand dominance: Right  PERTINENT HISTORY:  Anemia; anxiety; HTN; OA right knee  PAIN: 04/22/2024 Are you having pain? Yes: NPRS scale: 5-6/10 Pain location: C- spine bilateral Lt> Rt  Pain description: tight; uncomfortable Aggravating factors: driving in the car Relieving factors: stretching  PRECAUTIONS: None  RED FLAGS: None     WEIGHT BEARING RESTRICTIONS: No  FALLS:  Has patient fallen in last 6 months? Yes. Number of falls fell walking in the woods. Patient verbalized she does not feel her balance is an issue.  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  DIAGNOSTIC FINDINGS:  IMPRESSION: Multilevel degenerative disc disease with severe narrowing of the C4-C5 disc space.  PATIENT SURVEYS:  Eval:  NDI 8/50 16% 02/19/2024:  Neck Disability Index score: 5 / 50 = 10.0 % 04/01/2024:  Neck Disability Index score: 6 / 50 = 12.0 %  SENSATION: Occasional numbness and tingling in Rt arm  POSTURE: rounded shoulders and forward head  PALPATION: Increased muscle spasms of bilateral upper trap, suboccipitals, and cervical paraspinals  Decreased upper T spine mobility joint mobs Grade III   CERVICAL ROM:   Active ROM A/ROM (deg) eval A/ROM (Deg) 04/01/24  Flexion 60 65  Extension 30 50  Right lateral flexion 32 50  Left lateral flexion 43 50  Right rotation 80 80  Left rotation 85 90   (Blank rows = not tested)  UPPER EXTREMITY ROM: WFL bilateral    UPPER EXTREMITY MMT:  MMT Right eval Left eval  Shoulder flexion 4 4+  Shoulder extension    Shoulder abduction 4+ 4+  Shoulder adduction    Shoulder extension    Shoulder internal rotation    Shoulder external rotation    Middle trapezius    Lower trapezius    Elbow flexion 5 5  Elbow extension 5 5  Wrist flexion    Wrist extension    Wrist ulnar deviation    Wrist radial deviation    Wrist pronation    Wrist supination    Grip strength     (Blank rows = not tested)    FUNCTIONAL TESTS:  Eval:  5 times sit to stand: 11.92 sec no hands  TREATMENT DATE:  04/22/2024: UBE level 2.0 x 6 min (3/3) with PT present to discuss status 3  way scapular stabilization with blue loop x 10 each side 4D ball rolls with 2 lb (light blue) plyo ball x 20 each direction on each UE Prone drop and catch with right UE Prone shoulder extension with 2 lbs 2 x 10 both Prone shoulder row with 2 lbs 2 x 10 both Prone shoulder horizontal abduction with 2 lbs 2 x 10 both Side lying ER with 2 lbs 2 x 10 both Supine serratus punch with 2 lbs 2 x 10 Cervical Mechanical traction 15 mins, increased  to 18 lbs force  04/16/2024: UBE level 2.0 x 6 min (3/3) with PT present to discuss status 3 way scapular stabilization with blue loop x 10 each side 4D ball rolls with 2 lb (light blue) plyo ball x 20 each direction on each UE Y's at wall x 10 Standing [place] x 10 each direction Thread the needle x 8 each side Prone shoulder extension with 2 lbs 2 x 10 both Prone shoulder row with 2 lbs 2 x 10 both Prone shoulder horizontal abduction with 2 lbs 2 x 10 both Side lying ER with 2 lbs 2 x 10 both Cervical Mechanical traction 15 mins 15lbs force  04/14/2024: UBE level 1.0 x 6 min (3/3) with PT present to discuss status 3 way scapular stabilization with blue loop x 10 each side 4D ball rolls with 2 lb (light blue) plyo ball x 20 each direction on each UE Prone shoulder extension with 2 lbs 2 x 10 both Prone shoulder row with 2 lbs 2 x 10 both Prone shoulder horizontal abduction with 2 lbs 2 x 10 both Side lying ER with 2 lbs 2 x 10 both Supine serratus punch with 2 lbs x 20 both Trigger Point Dry Needling Subsequent Treatment: Instructions provided previously at initial dry needling treatment.  Patient Verbal Consent Given: Yes Education Handout Provided: Previously Provided Muscles Treated: bilateral suboccipitals, bilateral upper traps, bilateral cervical multifidi Electrical Stimulation Performed: No Treatment Response/Outcome: Skilled palpation used to identify taut bands and trigger points.  Once identified, dry needling techniques used to  treat these areas.  Twitch response ellicited along with palpable elongation of muscle.  Following treatment, patient reported relief of    PATIENT EDUCATION:  Education details: POC; DN handout & benefits; HEP; explanation of Xray results Person educated: Patient Education method: Explanation, Demonstration, and Handouts Education comprehension: verbalized understanding, returned demonstration, and needs further education  HOME EXERCISE PROGRAM: Access Code: [email or web address] URL: [email or web address] Date: 04/14/2024 Prepared by: Aletha Anderson  Exercises - Seated Upper Trapezius Stretch  - 1 x daily - 7 x weekly - 2 sets - 20-30 hold - Seated Upper Trapezius Stretch (Mirrored)  - 1 x daily - 7 x weekly - 2 sets - 20-30 hold - Seated Levator Scapulae Stretch  - 1 x daily - 7 x weekly - 1 sets - 2 reps - 20-30 hold - Seated Levator Scapulae Stretch (Mirrored)  - 1 x daily - 7 x weekly - 1 sets - 2 reps - 20-30 hold - Seated Scapular Retraction  - 1 x daily - 7 x weekly - 2 sets - 10 reps - Seated Cervical Retraction  - 1 x daily - 7 x weekly - 2 sets - 10 reps - Doorway Pec Stretch at 90 Degrees Abduction  - 1 x daily - 7 x weekly - 1 sets - 2 reps - 20-30 hold - Standing Low Shoulder Row with Anchored Resistance  - 1 x daily - 7 x weekly - 2 sets - 10 reps - Shoulder Extension with Resistance Hands Down  - 1 x daily - 7 x weekly - 2 sets - 10 reps - Ulnar Nerve Glide- Full Arm  - 1 x daily - 7 x weekly - 2 sets - 10 reps - Ulnar Nerve Glide- Full Arm (Mirrored)  - 1 x daily - 7 x weekly - 2 sets - 10 reps - Shoulder W - External Rotation with Resistance  - 1 x daily - 7 x weekly -  2 sets - 10 reps - Isometric Standing Shoulder External Rotation in Abduction  - 1 x daily - 7 x weekly - 2 sets - 10 reps - Isometric Standing Shoulder External Rotation in Abduction (Mirrored)  - 1 x daily - 7 x weekly - 2 sets - 10 reps - Isometric Standing Shoulder Internal Rotation - 90 Degrees  Abduction  - 1 x daily - 7 x weekly - 2 sets - 10 reps - Isometric Standing Shoulder Internal Rotation - 90 Degrees Abduction (Mirrored)  - 1 x daily - 7 x weekly - 2 sets - 10 reps - Cervical Retraction Prone on Elbows  - 1 x daily - 7 x weekly - 2 sets - 10 reps - Seated Assisted Cervical Rotation with Towel  - 1 x daily - 7 x weekly - 1-2 sets - 10 reps - Seated Assisted Cervical Rotation with Towel (Mirrored)  - 1 x daily - 7 x weekly - 1-2 sets - 10 reps - Upper Cervical Extension SNAG with Strap  - 1 x daily - 7 x weekly - 1-2 sets - 10 reps - Seated Upper Extremity Nerve Glide  - 1 x daily - 7 x weekly - 1-2 sets - 10 reps - Seated Upper Extremity Nerve Glide (Mirrored)  - 1 x daily - 7 x weekly - 1-2 sets - 10 reps - Prone Shoulder Extension - Single Arm  - 2 x daily - 7 x weekly - 2 sets - 10 reps - Prone Shoulder Row  - 2 x daily - 7 x weekly - 2 sets - 10 reps - Prone Single Arm Shoulder Horizontal Abduction with Scapular Retraction and Palm Down  - 2 x daily - 7 x weekly - 2 sets - 10 reps - Sidelying Shoulder External Rotation  - 2 x daily - 7 x weekly - 2 sets - 10 reps - Single Arm Serratus Punches in Supine with Dumbbell  - 2 x daily - 7 x weekly - 2 sets - 10 reps  ASSESSMENT:  CLINICAL IMPRESSION: Sohana continues to experience numbness with any repetitive use of her arm above shoulder level.  She responds well to right shoulder strengthening and scapular stabilization along with initiation of traction.  The numbness is a little more intermittent vs constant.  She should continue to do well but may need MRI on right shoulder to r/o patulous capsule or labral issues that may be causing humeral head pressure on the brachial plexus.  We did traction again today, increased to 18 lbs, and will continue to monitor symptoms.   Patient will benefit from skilled PT to address the below impairments and improve overall function.   OBJECTIVE IMPAIRMENTS: decreased ROM, decreased strength,  increased muscle spasms, impaired flexibility, postural dysfunction, and pain.    GOALS: Goals reviewed with patient? Yes  SHORT TERM GOALS: Target date: 02/17/2024  Patient will be independent with initial HEP. Baseline:  Goal status: Met   2.  Patient will report > or = to 30% improvement in symptoms since starting PT. Baseline:  Goal status: Met on 02/19/2024  3.  Patient will demonstrate correct seated posture. Baseline:  Goal status: Met on 02/23/24   LONG TERM GOALS: Target date: 05/14/2024  Patient will demonstrate independence in advanced HEP. Baseline:  Goal status: Ongoing  2.  Patient will report > or = to 70% improvement in symptoms since starting PT. Baseline:  Goal status: Ongoing (reported 60% improvement on 02/26/24)  3.  Patient will  verbalize and demonstrate self-care strategies to manage pain including tissue mobility practices and change of position. Baseline:  Goal status: Ongoing   4.  Patient will be able to drive and look in her blind spot with < or = to 2/10 neck pain. Baseline:  Goal status: Ongoing (reports that it is better, but she still has tightness on 04/01/24)  5.  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 Baseline:  Goal status: Ongoing (has started volunteering, but still has pain on 04/01/24)     PLAN:  PT FREQUENCY: 1-2x/week  PT DURATION: 8 weeks  PLANNED INTERVENTIONS: 97164- PT Re-evaluation, 97110-Therapeutic exercises, 97530- Therapeutic activity, 97112- Neuromuscular re-education, 97535- Self Care, 44034- Manual therapy, [phone]- Canalith repositioning, [id number]- Aquatic Therapy, D6387- Electrical stimulation (unattended), [id number]- Electrical stimulation (manual), [id number]- Vasopneumatic device, [id number]- Ultrasound, [id number]- Traction (mechanical), [id number]- Ionotophoresis 4mg /ml Dexamethasone, Patient/Family education, Taping, Dry Needling, Joint mobilization, Joint manipulation, Spinal  manipulation, Spinal mobilization, Vestibular training, Cryotherapy, and Moist heat  PLAN FOR NEXT SESSION:  Continue traction; continue postural and shoulder strengthening, flexibility, manual/dry needling as indicated,  if symptoms reach a plateau, patient may need referral for MRI to right shoulder.     Bridgette Campus B. Railee Bonillas, PT 04/22/24 9:18 AM [facility] [address] [place], [place] [address] Phone # [phone] Fax [phone]

## 2024-04-23 ENCOUNTER — Encounter: Payer: Self-pay | Admitting: Family Medicine

## 2024-04-23 ENCOUNTER — Telehealth: Admitting: Family Medicine

## 2024-04-23 DIAGNOSIS — M542 Cervicalgia: Secondary | ICD-10-CM | POA: Diagnosis not present

## 2024-04-23 DIAGNOSIS — M779 Enthesopathy, unspecified: Secondary | ICD-10-CM | POA: Insufficient documentation

## 2024-04-23 DIAGNOSIS — J3089 Other allergic rhinitis: Secondary | ICD-10-CM | POA: Diagnosis not present

## 2024-04-23 DIAGNOSIS — J3081 Allergic rhinitis due to animal (cat) (dog) hair and dander: Secondary | ICD-10-CM | POA: Diagnosis not present

## 2024-04-23 DIAGNOSIS — M25511 Pain in right shoulder: Secondary | ICD-10-CM

## 2024-04-23 DIAGNOSIS — G8929 Other chronic pain: Secondary | ICD-10-CM

## 2024-04-23 DIAGNOSIS — M4722 Other spondylosis with radiculopathy, cervical region: Secondary | ICD-10-CM

## 2024-04-23 DIAGNOSIS — M9979 Connective tissue and disc stenosis of intervertebral foramina of abdomen and other regions: Secondary | ICD-10-CM

## 2024-04-23 DIAGNOSIS — J301 Allergic rhinitis due to pollen: Secondary | ICD-10-CM | POA: Diagnosis not present

## 2024-04-23 NOTE — Patient Instructions (Signed)

## 2024-04-23 NOTE — Progress Notes (Signed)
 VIRTUAL VISIT VIA VIDEO  I connected with Maria Goodwin on 04/23/24 at  1:00 PM EDT by a video enabled telemedicine application and verified that I am speaking with the correct person using two identifiers. Location patient: Home Location provider: [facility], Office Persons participating in the virtual visit: Patient, Dr. Marylee Snowball and Pressley Brome, CMA  I discussed the limitations of evaluation and management by telemedicine and the availability of in person appointments. The patient expressed understanding and agreed to proceed.     Patient ID: Maria Goodwin, female  DOB: 1981/11/12, 43 y.o.   MRN: [id number] Patient Care Team    Relationship Specialty Notifications Start End  Mariel Shope, DO PCP - General Family Medicine  02/22/20   Laine Piggs, NP Nurse Practitioner Obstetrics and Gynecology  06/04/23   Anselmo Kings, MD Referring Physician Allergy and Immunology  12/11/23     Chief Complaint  Patient presents with   Shoulder Pain    Pt requesting MRI of shoulder; was suggested by PT.     Subjective: Maria Goodwin is a 43 y.o.  Female  present to discuss neck and shoulder pain.  All past medical history, surgical history, allergies, family history, immunizations, medications and social history were updated in the electronic medical record today. All recent labs, ED visits and hospitalizations within the last year were reviewed.  Neck/shoulder pain: Pt presented at her [facility] 12/2023 with complaints of chronic right sided neck pain that had been worsening.  Patient had responded to physical therapy in the past and would like referral to restart.  We also obtain cervical spine x-rays at that time. Cervical spine/neck x-ray resulted with multilevel degenerative disc disease, which is degeneration/arthritis.She also has a narrowing of the C3/C4 disc space, with significant bone spur at that location.   She Has been performing PT since feb/March.2025 She unfortunately has  seen a plateau in her improvement.  She is having less radiculopathy below the elbow, but is still having significant discomfort right side neck across to shoulder. Patient reports the physical therapist she worked with recently had suggested there may be more of a intra-articular shoulder etiology compounding her symptoms.  She is wondering if she needs an MRI of her shoulder. She again reports the pain is located the right side of her neck across the top of her shoulder blade to shoulder.      07/30/2023    8:21 AM 12/20/2022    3:26 PM 10/15/2021    2:37 PM 09/14/2020    8:01 AM 02/22/2020    1:25 PM  Depression screen PHQ 2/9  Decreased Interest 0 0 0 0 0  Down, Depressed, Hopeless 0 0 0 0 0  PHQ - 2 Score 0 0 0 0 0  Altered sleeping  0     PHQ-9 Score  0          No data to display           Immunization History  Administered Date(s) Administered   Hepatitis A 08/15/2016, 02/11/2017   Hepatitis B 01/31/1987   Influenza,inj,Quad PF,6+ Mos 07/21/2021, 07/05/2022   Influenza-Unspecified 09/07/2019, 08/02/2020, 07/25/2023   PFIZER Comirnaty(Gray Top)Covid-19 Tri-Sucrose Vaccine 08/13/2022   PFIZER(Purple Top)SARS-COV-2 Vaccination 02/08/2020, 02/29/2020, 09/15/2020   Pfizer Covid-19 Vaccine Bivalent Booster 70yrs & up 08/09/2021   Tdap 08/15/2016     Past Medical History:  Diagnosis Date   Allergic reaction 02/23/2020   Itchy mouth with watermelon and cucumber.  Refer to allergist.  Allergic rhinitis    Allergy    Anemia 2021   Anxiety Since childhood   Blood in stool 2010   Patient reports hospitalization for blood in stool, diarrhea and extreme dehydration.  She was hospitalized at this time and a colonoscopy was completed.   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   Frequent headaches    GERD (gastroesophageal reflux disease)    Heart murmur    Hypertension    Iron  deficiency  anemia    Irregular menstrual bleeding 2015   Numbness and tingling of upper extremity 02/23/2020   Osteoarthritis of knee 2016   Other allergic rhinitis 03/23/2020   Patellofemoral pain syndrome 04/2015   Tinea corporis    Tonsil stone 2016   Vitamin D  deficiency    No Known Allergies Past Surgical History:  Procedure Laterality Date   BREAST BIOPSY Left 08/2016   fibroadenoma   BREAST BIOPSY Left 01/15/2021   BREAST BIOPSY Left 2023   BREAST BIOPSY Left 04/16/2024   US  LT BREAST BX W LOC DEV 1ST LESION IMG BX SPEC US  GUIDE 04/16/2024 [facility] MAMMOGRAPHY   CESAREAN SECTION  2012   WISDOM TOOTH EXTRACTION     Family History  Problem Relation Age of Onset   Arthritis Mother    Hyperlipidemia Mother    Hypertension Mother    Learning disabilities Mother    Allergic rhinitis Mother    ALS Mother    Alcohol abuse Father    Depression Father    Vision loss Father    Asthma Brother    Hyperlipidemia Brother    Hypertension Brother    Allergic rhinitis Brother    Asthma Brother    Hyperlipidemia Brother    Hypertension Brother    Allergic rhinitis Brother    Bladder Cancer Maternal Grandmother    Early death Maternal Grandmother    Skin cancer Maternal Grandfather    Early death Maternal Grandfather    Stroke Maternal Grandfather    Hypertension Maternal Grandfather    Skin cancer Paternal Grandmother    Skin cancer Paternal Grandfather    Allergic rhinitis Son    Eczema Son    Urticaria Son    Breast cancer Neg Hx    Social History   Social History Narrative   Marital status/children/pets: Married, 1 child.   Education/employment: High school graduate, stay-at-home mom   Safety:      -Wears a bicycle helmet riding a bike: Yes     -smoke alarm in the home:Yes     - wears seatbelt: Yes     - Feels safe in their relationships: Yes    Allergies as of 04/23/2024   No Known Allergies      Medication List        Accurate as of April 23, 2024  1:29 PM. If you  have any questions, ask your nurse or doctor.          ALLEGRA-D 24 HOUR PO Take by mouth.   amLODipine  10 MG tablet Commonly known as: NORVASC  Take 1 tablet (10 mg total) by mouth daily.   EPINEPHrine 0.3 mg/0.3 mL Soaj injection Commonly known as: EPI-PEN See admin instructions.   fluticasone  50 MCG/ACT nasal spray Commonly known as: FLONASE  Place 2 sprays into both nostrils in the morning and at bedtime.   MULTI COMPLETE PO Take by mouth.   naproxen  500 MG tablet Commonly known as: Naprosyn   Take 1 tablet (500 mg total) by mouth 2 (two) times daily as needed for mild pain.   olopatadine 0.1 % ophthalmic solution Commonly known as: PATANOL 1 drop 2 (two) times daily.   omeprazole  40 MG capsule Commonly known as: PRILOSEC Take 1 capsule (40 mg total) by mouth daily.        All past medical history, surgical history, allergies, family history, immunizations andmedications were updated in the EMR today and reviewed under the history and medication portions of their EMR.       ROS: 14 pt review of systems performed and negative (unless mentioned in an HPI)  Objective: There were no vitals taken for this visit. Physical Exam Vitals and nursing note reviewed.  Constitutional:      General: She is not in acute distress.    Appearance: Normal appearance. She is normal weight. She is not ill-appearing or toxic-appearing.  HENT:     Head: Normocephalic and atraumatic.  Eyes:     General: No scleral icterus.       Right eye: No discharge.        Left eye: No discharge.     Extraocular Movements: Extraocular movements intact.     Conjunctiva/sclera: Conjunctivae normal.     Pupils: Pupils are equal, round, and reactive to light.  Pulmonary:     Effort: Pulmonary effort is normal.  Skin:    Findings: No rash.  Neurological:     Mental Status: She is alert and oriented to person, place, and time. Mental status is at baseline.     Motor: No weakness.      Coordination: Coordination normal.     Gait: Gait normal.  Psychiatric:        Mood and Affect: Mood normal.        Behavior: Behavior normal.        Thought Content: Thought content normal.        Judgment: Judgment normal.     No results found.  Assessment/plan: Robbye Dede is a 43 y.o. female present for  Neck pain (Primary)/Cervical radiculopathy due to degenerative joint disease of spine/Narrowing of intervertebral disc space C3-C4. Bone spur C3-C4 Has been performing PT for > 12 weeks and is no longer appreciating great improvement. She has experienced some improvement, less referred pain below elbow, some neck pain improvement. Pain is still consistent with increased use in her shoulder/trap area.  We discussed sports med as next step to better evaluate the origin of pain and recommend next steps in work up and treatment plan.  May benefit from US , MRI, OMT, steroid inj.  - Ambulatory referral to Sports Medicine  Chronic right shoulder pain ? Referred pain from cervical DDD and bone spur, vs rotator vs PT concerns for labral or capsular cause, or both . - Ambulatory referral to Sports Medicine   No follow-ups on file.   Orders Placed This Encounter  Procedures   Ambulatory referral to Sports Medicine   No orders of the defined types were placed in this encounter.  Referral Orders         Ambulatory referral to Sports Medicine      42 minutes was spent with patient in addition to the normal preventative physical exam appointment time, to cover multiple chronic issues that required image studies, labs and referral placements.  In addition, 1 new problem was discussed today and will be evaluated by imaging study and possible referral to PT.  Patient will be called with those results  and further plan discussed.  Electronically signed by: Napolean Backbone, DO [facility]

## 2024-04-26 ENCOUNTER — Encounter: Payer: Self-pay | Admitting: Physical Therapy

## 2024-04-26 ENCOUNTER — Ambulatory Visit: Admitting: Physical Therapy

## 2024-04-26 ENCOUNTER — Ambulatory Visit (AMBULATORY_SURGERY_CENTER): Admitting: *Deleted

## 2024-04-26 VITALS — Ht 61.5 in | Wt 170.0 lb

## 2024-04-26 DIAGNOSIS — G8929 Other chronic pain: Secondary | ICD-10-CM

## 2024-04-26 DIAGNOSIS — R252 Cramp and spasm: Secondary | ICD-10-CM | POA: Diagnosis not present

## 2024-04-26 DIAGNOSIS — Z1211 Encounter for screening for malignant neoplasm of colon: Secondary | ICD-10-CM

## 2024-04-26 DIAGNOSIS — M542 Cervicalgia: Secondary | ICD-10-CM | POA: Diagnosis not present

## 2024-04-26 DIAGNOSIS — M25511 Pain in right shoulder: Secondary | ICD-10-CM | POA: Diagnosis not present

## 2024-04-26 DIAGNOSIS — R293 Abnormal posture: Secondary | ICD-10-CM

## 2024-04-26 DIAGNOSIS — K219 Gastro-esophageal reflux disease without esophagitis: Secondary | ICD-10-CM

## 2024-04-26 DIAGNOSIS — M6281 Muscle weakness (generalized): Secondary | ICD-10-CM

## 2024-04-26 DIAGNOSIS — R1310 Dysphagia, unspecified: Secondary | ICD-10-CM

## 2024-04-26 MED ORDER — NA SULFATE-K SULFATE-MG SULF 17.5-3.13-1.6 GM/177ML PO SOLN: 1.0000 | Freq: Once | ORAL | 0 refills | Status: AC

## 2024-04-26 NOTE — Therapy (Signed)
 OUTPATIENT PHYSICAL THERAPY TREATMENT NOTE    Patient Name: Maria Goodwin MRN: [id number] DOB:[birth year], 43 y.o., female Today's Date: 04/26/2024  END OF SESSION:  PT End of Session - 04/26/24 1551     Visit Number 17    Date for PT Re-Evaluation 05/14/24    Authorization Type [employer] (50vl)    PT Start Time 1446    PT Stop Time 1544    PT Time Calculation (min) 58 min    Activity Tolerance Patient tolerated treatment well    Behavior During Therapy WFL for tasks assessed/performed                   Past Medical History:  Diagnosis Date   Allergic reaction 02/23/2020   Itchy mouth with watermelon and cucumber.  Refer to allergist.   Allergic rhinitis    Allergy    Anemia 2021   Anxiety Since childhood   Blood in stool 2010   Patient reports hospitalization for blood in stool, diarrhea and extreme dehydration.  She was hospitalized at this time and a colonoscopy was completed.   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   Frequent headaches    GERD (gastroesophageal reflux disease)    Heart murmur    Hypertension    Iron  deficiency anemia    Irregular menstrual bleeding 2015   Numbness and tingling of upper extremity 02/23/2020   Osteoarthritis of knee 2016   Other allergic rhinitis 03/23/2020   Patellofemoral pain syndrome 04/2015   Tinea corporis    Tonsil stone 2016   Vitamin D  deficiency    Past Surgical History:  Procedure Laterality Date   BREAST BIOPSY Left 08/2016   fibroadenoma   BREAST BIOPSY Left 01/15/2021   BREAST BIOPSY Left 2023   BREAST BIOPSY Left 04/16/2024   US  LT BREAST BX W LOC DEV 1ST LESION IMG BX SPEC US  GUIDE 04/16/2024 [facility]   CESAREAN SECTION  2012   WISDOM TOOTH EXTRACTION     Patient Active Problem List   Diagnosis Date Noted   Narrowing of intervertebral disc space C3-C4 04/23/2024   Bone spur C3-C4 04/23/2024   Cervical  radiculopathy due to degenerative joint disease of spine 04/23/2024   Chronic right shoulder pain 04/23/2024   GERD (gastroesophageal reflux disease) 01/14/2024   Encounter for breast cancer screening other than mammogram 01/14/2024   Neck pain 01/14/2024   Iron  deficiency 07/30/2023   Pharyngoesophageal dysphagia 07/30/2023   Vitamin D  deficiency 10/15/2021   Obstructive sleep apnea 05/18/2021   Abnormal mammogram 12/28/2020   Palpitations 06/13/2020   Severe menstrual cramps 06/13/2020   Menorrhagia with regular cycle 06/13/2020   Pollen-food allergy 03/23/2020   Hypertension 02/22/2020   E66.9 (BMI 30-39.9) 02/22/2020    PCP: Mariel Shope, DO  REFERRING PROVIDER: Mariel Shope, DO  REFERRING DIAG: M50.30 (ICD-10-CM) - DDD (degenerative disc disease), cervical M99.79 (ICD-10-CM) - Narrowing of intervertebral disc space M54.2 (ICD-10-CM) - Neck pain  THERAPY DIAG:  Cervicalgia  Chronic right shoulder pain  Cramp and spasm  Muscle weakness (generalized)  Abnormal posture  Rationale for Evaluation and Treatment: Rehabilitation  ONSET DATE: Chronic  SUBJECTIVE:  SUBJECTIVE STATEMENT: Patient reports traction has given her a lot of relief. She is not currently having pain just stiffness.  Hand dominance: Right  PERTINENT HISTORY:  Anemia; anxiety; HTN; OA right knee  PAIN: 04/26/2024 Are you having pain? Yes: NPRS scale: 0/10 Pain location: C- spine bilateral Lt> Rt  Pain description: tight; uncomfortable Aggravating factors: driving in the car Relieving factors: stretching  PRECAUTIONS: None  RED FLAGS: None     WEIGHT BEARING RESTRICTIONS: No  FALLS:  Has patient fallen in last 6 months? Yes. Number of falls fell walking in the woods. Patient verbalized  she does not feel her balance is an issue.  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  DIAGNOSTIC FINDINGS:  IMPRESSION: Multilevel degenerative disc disease with severe narrowing of the C4-C5 disc space.  PATIENT SURVEYS:  Eval:  NDI 8/50 16% 02/19/2024:  Neck Disability Index score: 5 / 50 = 10.0 % 04/01/2024:  Neck Disability Index score: 6 / 50 = 12.0 %  SENSATION: Occasional numbness and tingling in Rt arm  POSTURE: rounded shoulders and forward head  PALPATION: Increased muscle spasms of bilateral upper trap, suboccipitals, and cervical paraspinals  Decreased upper T spine mobility joint mobs Grade III   CERVICAL ROM:   Active ROM A/ROM (deg) eval A/ROM (Deg) 04/01/24  Flexion 60 65  Extension 30 50  Right lateral flexion 32 50  Left lateral flexion 43 50  Right rotation 80 80  Left rotation 85 90   (Blank rows = not tested)  UPPER EXTREMITY ROM: WFL bilateral    UPPER EXTREMITY MMT:  MMT Right eval Left eval  Shoulder flexion 4 4+  Shoulder extension    Shoulder abduction 4+ 4+  Shoulder adduction    Shoulder extension    Shoulder internal rotation    Shoulder external rotation    Middle trapezius    Lower trapezius    Elbow flexion 5 5  Elbow extension 5 5  Wrist flexion    Wrist extension    Wrist ulnar deviation    Wrist radial deviation    Wrist pronation    Wrist supination    Grip strength     (Blank rows = not tested)    FUNCTIONAL TESTS:  Eval:  5 times sit to stand:  11.92 sec no hands  TREATMENT DATE:  04/26/2024: UBE level 2.0 x 6 min (3/3) with PT present to discuss status 3 way scapular stabilization with yellow loop x 10 each side 4D ball rolls with 2 lb (light blue) plyo ball x 20 each direction on each UE Cervical melt method (flexion/extension) x 20 Prone shoulder extension with 2 lbs 2 x 10 both Prone shoulder row with 2 lbs 2 x 10 both Prone shoulder horizontal abduction with 2 lbs 2 x 10 both Side lying ER with 2 lbs 2 x 10 both Supine serratus punch with 2 lbs 2 x 10 Foam roll along spine D2 flexion, ER  and  shoulder abduction with green TB x 10 each  Cervical Mechanical traction 15 mins, increased to  18 lbs force    04/22/2024: UBE level 2.0 x 6 min (3/3) with PT present to discuss status 3 way scapular stabilization with blue loop x 10 each side 4D ball rolls with 2 lb (light blue) plyo ball x 20 each direction on each UE Prone drop and catch with right UE Prone shoulder extension with 2 lbs 2 x 10 both Prone shoulder row with 2 lbs 2 x 10 both Prone shoulder horizontal abduction with 2 lbs 2 x 10 both Side lying ER with 2 lbs 2 x 10 both Supine serratus punch with 2 lbs 2 x 10 Cervical Mechanical traction 15 mins, increased to 18 lbs force  04/16/2024: UBE level 2.0 x 6 min (3/3) with PT present to discuss status 3 way scapular stabilization with blue loop x 10 each side 4D ball rolls with 2 lb (light blue) plyo ball x 20 each direction on each UE Y's at wall x 10 Standing [place] x 10 each direction Thread the needle x 8 each side Prone shoulder extension with 2 lbs 2 x 10 both Prone shoulder row with 2 lbs 2 x 10 both Prone shoulder horizontal abduction with 2 lbs 2 x 10 both Side lying ER with 2 lbs 2 x 10 both Cervical Mechanical traction 15 mins 15lbs force    PATIENT EDUCATION:  Education details: POC; DN handout & benefits; HEP; explanation of Xray results Person educated: Patient Education method:  Explanation, Demonstration, and Handouts Education comprehension: verbalized understanding, returned demonstration, and needs further education  HOME EXERCISE PROGRAM: Access Code: [email or web address] URL: [email or web address] Date: 04/14/2024 Prepared by: Aletha Anderson  Exercises - Seated Upper Trapezius Stretch  - 1 x daily - 7 x weekly - 2 sets - 20-30 hold - Seated Upper Trapezius Stretch (Mirrored)  - 1 x daily - 7 x weekly - 2 sets - 20-30 hold - Seated Levator Scapulae Stretch  - 1 x daily - 7 x weekly - 1 sets - 2 reps - 20-30 hold - Seated Levator Scapulae Stretch (Mirrored)  - 1 x daily - 7 x weekly - 1 sets - 2 reps - 20-30 hold - Seated Scapular Retraction  - 1 x daily - 7 x weekly - 2 sets - 10 reps - Seated Cervical Retraction  - 1 x daily - 7 x weekly - 2 sets - 10 reps - Doorway Pec Stretch at 90 Degrees Abduction  - 1 x daily - 7 x weekly - 1 sets - 2 reps - 20-30 hold - Standing Low Shoulder Row with Anchored Resistance  - 1 x daily - 7 x weekly - 2 sets - 10 reps - Shoulder Extension with Resistance Hands Down  - 1 x daily - 7 x weekly - 2 sets - 10 reps - Ulnar Nerve Glide- Full Arm  - 1 x daily - 7 x weekly - 2 sets - 10 reps - Ulnar Nerve Glide- Full Arm (Mirrored)  - 1 x daily - 7 x weekly - 2 sets - 10 reps - Shoulder W - External Rotation with Resistance  - 1 x daily - 7 x weekly - 2 sets - 10 reps - Isometric Standing Shoulder External Rotation in Abduction  - 1 x daily - 7 x weekly - 2 sets - 10 reps - Isometric Standing Shoulder External Rotation in Abduction (Mirrored)  - 1 x daily - 7 x weekly - 2 sets - 10 reps - Isometric Standing Shoulder Internal Rotation - 90 Degrees  Abduction  - 1 x daily - 7 x weekly - 2 sets - 10 reps - Isometric Standing Shoulder Internal Rotation - 90 Degrees Abduction (Mirrored)  - 1 x daily - 7 x weekly - 2 sets - 10 reps - Cervical Retraction Prone on Elbows  - 1 x daily - 7 x weekly - 2 sets - 10 reps - Seated Assisted  Cervical Rotation with Towel  - 1 x daily - 7 x weekly - 1-2 sets - 10 reps - Seated Assisted Cervical Rotation with Towel (Mirrored)  - 1 x daily - 7 x weekly - 1-2 sets - 10 reps - Upper Cervical Extension SNAG with Strap  - 1 x daily - 7 x weekly - 1-2 sets - 10 reps - Seated Upper Extremity Nerve Glide  - 1 x daily - 7 x weekly - 1-2 sets - 10 reps - Seated Upper Extremity Nerve Glide (Mirrored)  - 1 x daily - 7 x weekly - 1-2 sets - 10 reps - Prone Shoulder Extension - Single Arm  - 2 x daily - 7 x weekly - 2 sets - 10 reps - Prone Shoulder Row  - 2 x daily - 7 x weekly - 2 sets - 10 reps - Prone Single Arm Shoulder Horizontal Abduction with Scapular Retraction and Palm Down  - 2 x daily - 7 x weekly - 2 sets - 10 reps - Sidelying Shoulder External Rotation  - 2 x daily - 7 x weekly - 2 sets - 10 reps - Single Arm Serratus Punches in Supine with Dumbbell  - 2 x daily - 7 x weekly - 2 sets - 10 reps  ASSESSMENT:  CLINICAL IMPRESSION: Maria Goodwin continues to verbalize improvement in symptoms since traction has been initiated. She was able to schedule an appointment with her PCP and she referred her to a sports/ortho clinic. Today's treatment session focused on periscapular strengthening and shoulder stabilization. PT monitored patient throughout and provided verbal cues as needed. Patient will benefit from skilled PT to address the below impairments and improve overall function.     OBJECTIVE IMPAIRMENTS: decreased ROM, decreased strength, increased muscle spasms, impaired flexibility, postural dysfunction, and pain.    GOALS: Goals reviewed with patient? Yes  SHORT TERM GOALS: Target date: 02/17/2024  Patient will be independent with initial HEP. Baseline:  Goal status: Met   2.  Patient will report > or = to 30% improvement in symptoms since starting PT. Baseline:  Goal status: Met on 02/19/2024  3.  Patient will demonstrate correct seated posture. Baseline:  Goal status: Met on  02/23/24   LONG TERM GOALS: Target date: 05/14/2024  Patient will demonstrate independence in advanced HEP. Baseline:  Goal status: Ongoing  2.  Patient will report > or = to 70% improvement in symptoms since starting PT. Baseline:  Goal status: Ongoing (reported 60% improvement on 02/26/24)  3.  Patient will verbalize and demonstrate self-care strategies to manage pain including tissue mobility practices and change of position. Baseline:  Goal status: Ongoing   4.  Patient will be able to drive and look in her blind spot with < or = to 2/10 neck pain. Baseline:  Goal status: Ongoing (reports that it is better, but she still has tightness on 04/01/24)  5.  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 Baseline:  Goal status: Ongoing (has started volunteering, but still has pain on 04/01/24)  PLAN:  PT FREQUENCY: 1-2x/week  PT DURATION: 8 weeks  PLANNED INTERVENTIONS: 97164- PT Re-evaluation, 97110-Therapeutic exercises, 97530- Therapeutic activity, 97112- Neuromuscular re-education, 97535- Self Care, [address]- Manual therapy, [phone]- Canalith repositioning, [id number]- Aquatic Therapy, [phone]- Electrical stimulation (unattended), [phone]- Electrical stimulation (manual), [id number]- Vasopneumatic device, [id number]- Ultrasound, [id number]- Traction (mechanical), [id number]- Ionotophoresis 4mg /ml Dexamethasone, Patient/Family education, Taping, Dry Needling, Joint mobilization, Joint manipulation, Spinal manipulation, Spinal mobilization, Vestibular training, Cryotherapy, and Moist heat  PLAN FOR NEXT SESSION:  Continue traction; continue postural and shoulder strengthening, flexibility, manual/dry needling as indicated,  ask about scheduling for referral   Penelope Bowie, PT 04/26/24 3:52 PM [facility] [address] [place], [place] [address] Phone # [phone] Fax [phone]

## 2024-04-26 NOTE — Progress Notes (Signed)
 Pt's name and DOB verified at the beginning of the pre-visit wit 2 identifiers  Pt denies any difficulty with ambulating,sitting, laying down or rolling side to side  Pt has no issues moving head neck but hx of Dysphgia  No egg or soy allergy known to patient   No issues known to pt with past sedation with any surgeries or procedures  Patient denies ever being intubated  No FH of Malignant Hyperthermia  Pt is not on home 02   Pt is not on blood thinners   Pt denies issues with constipation   Pt is not on dialysis  Pt hx of heart murmur  Pt denies any upcoming cardiac testing  Patient's chart reviewed by Rogena Class CNRA prior to pre-visit and patient appropriate for the [facility].  Pre-visit completed and red dot placed by patient's name on their procedure day (on provider's schedule).    Visit by phone   Pt states weight is 170 1b   IInstructions reviewed. Pt given  both [facility] main # and MD on call # prior to instructions.  Pt states understanding of instructions. Instructed pt to review instructions again prior to procedure and call main # given if has questions.. Pt states they will.   Instructed pt on where to find instructions on My Chart.

## 2024-04-30 ENCOUNTER — Encounter: Payer: Self-pay | Admitting: Gastroenterology

## 2024-05-04 ENCOUNTER — Ambulatory Visit

## 2024-05-05 NOTE — Progress Notes (Unsigned)
    Ben Jackson D.[facility] [address] [place] [address] Phone: [phone]   Assessment and Plan:     There are no diagnoses linked to this encounter.  ***   Pertinent previous records reviewed include ***    Follow Up: ***     Subjective:   I, Maria Goodwin, am serving as a Neurosurgeon for Doctor Ulysees Gander  Chief Complaint: neck and shoulder pain   HPI:   05/06/2024 Patient is a 43 year old female with neck and shoulder pain. Patient states   Relevant Historical Information: ***  Additional pertinent review of systems negative.   Current Outpatient Medications:    albuterol (VENTOLIN HFA) 108 (90 Base) MCG/ACT inhaler, INHALE 2 PUFFS EVERY 4-6 HOURS AS NEEDED COUGH/WHEEZE for 30, Disp: , Rfl:    amLODipine  (NORVASC ) 10 MG tablet, Take 1 tablet (10 mg total) by mouth daily., Disp: 90 tablet, Rfl: 1   EPINEPHrine 0.3 mg/0.3 mL IJ SOAJ injection, See admin instructions., Disp: , Rfl:    Fexofenadine-Pseudoephedrine (ALLEGRA-D 24 HOUR PO), Take by mouth., Disp: , Rfl:    fluticasone  (FLONASE ) 50 MCG/ACT nasal spray, Place 2 sprays into both nostrils in the morning and at bedtime., Disp: 16 g, Rfl: 6   Multiple Vitamins-Minerals (MULTI COMPLETE PO), Take by mouth., Disp: , Rfl:    naproxen  (NAPROSYN ) 500 MG tablet, Take 1 tablet (500 mg total) by mouth 2 (two) times daily as needed for mild pain., Disp: 60 tablet, Rfl: 4   omeprazole  (PRILOSEC) 40 MG capsule, Take 1 capsule (40 mg total) by mouth daily., Disp: 90 capsule, Rfl: 1   Objective:     There were no vitals filed for this visit.    There is no height or weight on file to calculate BMI.    Physical Exam:    ***   Electronically signed by:  Marshall Skeeter D.[facility] 7:44 AM 05/05/24

## 2024-05-06 ENCOUNTER — Ambulatory Visit (INDEPENDENT_AMBULATORY_CARE_PROVIDER_SITE_OTHER)

## 2024-05-06 ENCOUNTER — Ambulatory Visit: Admitting: Sports Medicine

## 2024-05-06 VITALS — Ht 61.0 in | Wt 169.0 lb

## 2024-05-06 DIAGNOSIS — M25511 Pain in right shoulder: Secondary | ICD-10-CM | POA: Diagnosis not present

## 2024-05-06 DIAGNOSIS — M4722 Other spondylosis with radiculopathy, cervical region: Secondary | ICD-10-CM | POA: Diagnosis not present

## 2024-05-06 DIAGNOSIS — M542 Cervicalgia: Secondary | ICD-10-CM

## 2024-05-06 DIAGNOSIS — G8929 Other chronic pain: Secondary | ICD-10-CM | POA: Diagnosis not present

## 2024-05-06 DIAGNOSIS — M5030 Other cervical disc degeneration, unspecified cervical region: Secondary | ICD-10-CM

## 2024-05-06 DIAGNOSIS — M19011 Primary osteoarthritis, right shoulder: Secondary | ICD-10-CM | POA: Diagnosis not present

## 2024-05-06 MED ORDER — MELOXICAM 15 MG PO TABS: 15.0000 mg | ORAL_TABLET | Freq: Every day | ORAL | 0 refills | Status: DC

## 2024-05-06 NOTE — Patient Instructions (Addendum)
-   Start meloxicam 15 mg daily x2 weeks.  If still having pain after 2 weeks, complete 3rd-week of NSAID. May use remaining NSAID as needed once daily for pain control.  Do not to use additional over-the-counter NSAIDs (ibuprofen, naproxen , Advil, Aleve , etc.) while taking prescription NSAIDs.  May use Tylenol [phone] mg 2 to 3 times a day for breakthrough pain. Xrays on the way out  PT referral  Shoulder HEP  MRI cervical  Dont start meloxicam until after colonoscopy  Follow up 1 week after MRI to discuss results

## 2024-05-07 ENCOUNTER — Ambulatory Visit: Payer: Self-pay | Admitting: Sports Medicine

## 2024-05-07 DIAGNOSIS — J3089 Other allergic rhinitis: Secondary | ICD-10-CM | POA: Diagnosis not present

## 2024-05-07 DIAGNOSIS — J301 Allergic rhinitis due to pollen: Secondary | ICD-10-CM | POA: Diagnosis not present

## 2024-05-07 DIAGNOSIS — J3081 Allergic rhinitis due to animal (cat) (dog) hair and dander: Secondary | ICD-10-CM | POA: Diagnosis not present

## 2024-05-09 ENCOUNTER — Ambulatory Visit (INDEPENDENT_AMBULATORY_CARE_PROVIDER_SITE_OTHER)

## 2024-05-09 DIAGNOSIS — M5030 Other cervical disc degeneration, unspecified cervical region: Secondary | ICD-10-CM | POA: Diagnosis not present

## 2024-05-09 DIAGNOSIS — M4802 Spinal stenosis, cervical region: Secondary | ICD-10-CM | POA: Diagnosis not present

## 2024-05-09 DIAGNOSIS — M5021 Other cervical disc displacement,  high cervical region: Secondary | ICD-10-CM | POA: Diagnosis not present

## 2024-05-09 DIAGNOSIS — G8929 Other chronic pain: Secondary | ICD-10-CM

## 2024-05-09 DIAGNOSIS — M25511 Pain in right shoulder: Secondary | ICD-10-CM | POA: Diagnosis not present

## 2024-05-09 DIAGNOSIS — M50222 Other cervical disc displacement at C5-C6 level: Secondary | ICD-10-CM | POA: Diagnosis not present

## 2024-05-09 DIAGNOSIS — M4722 Other spondylosis with radiculopathy, cervical region: Secondary | ICD-10-CM

## 2024-05-09 DIAGNOSIS — M542 Cervicalgia: Secondary | ICD-10-CM

## 2024-05-09 DIAGNOSIS — M50321 Other cervical disc degeneration at C4-C5 level: Secondary | ICD-10-CM | POA: Diagnosis not present

## 2024-05-11 ENCOUNTER — Ambulatory Visit: Admitting: Physical Therapy

## 2024-05-11 ENCOUNTER — Encounter: Payer: Self-pay | Admitting: Physical Therapy

## 2024-05-11 DIAGNOSIS — M6281 Muscle weakness (generalized): Secondary | ICD-10-CM | POA: Diagnosis not present

## 2024-05-11 DIAGNOSIS — R293 Abnormal posture: Secondary | ICD-10-CM

## 2024-05-11 DIAGNOSIS — G8929 Other chronic pain: Secondary | ICD-10-CM

## 2024-05-11 DIAGNOSIS — R252 Cramp and spasm: Secondary | ICD-10-CM

## 2024-05-11 DIAGNOSIS — M542 Cervicalgia: Secondary | ICD-10-CM

## 2024-05-11 DIAGNOSIS — M25511 Pain in right shoulder: Secondary | ICD-10-CM | POA: Diagnosis not present

## 2024-05-11 NOTE — Therapy (Signed)
 OUTPATIENT PHYSICAL THERAPY TREATMENT NOTE / RE-CERTIFICATION   Patient Name: Maria Goodwin MRN: [id number] DOB:Sep 17, 1981, 43 y.o., female Today's Date: 05/11/2024  END OF SESSION:  PT End of Session - 05/11/24 1037     Visit Number 18    Date for PT Re-Evaluation 06/22/24    Authorization Type [employer] (50vl)    PT Start Time [phone]    PT Stop Time 1035    PT Time Calculation (min) 61 min    Activity Tolerance Patient tolerated treatment well    Behavior During Therapy [facility] for tasks assessed/performed                 Past Medical History:  Diagnosis Date   Allergic reaction 02/23/2020   Itchy mouth with watermelon and cucumber.  Refer to allergist.   Allergic rhinitis    Allergy    Anemia 2021   Anxiety Since childhood   Blood in stool 2010   Patient reports hospitalization for blood in stool, diarrhea and extreme dehydration.  She was hospitalized at this time and a colonoscopy was completed.   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   Frequent headaches    GERD (gastroesophageal reflux disease)    Heart murmur    Hypertension    Iron  deficiency anemia    Irregular menstrual bleeding 2015   Numbness and tingling of upper extremity 02/23/2020   Osteoarthritis of knee 2016   Other allergic rhinitis 03/23/2020   Patellofemoral pain syndrome 04/2015   Tinea corporis    Tonsil stone 2016   Vitamin D  deficiency    Past Surgical History:  Procedure Laterality Date   BREAST BIOPSY Left 08/2016   fibroadenoma   BREAST BIOPSY Left 01/15/2021   BREAST BIOPSY Left 2023   BREAST BIOPSY Left 04/16/2024   US  LT BREAST BX W LOC DEV 1ST LESION IMG BX SPEC US  GUIDE 04/16/2024 [facility]AM[facility]   CESAREAN SECTION  2012   WISDOM TOOTH EXTRACTION     Patient Active Problem List   Diagnosis Date Noted   Narrowing of intervertebral disc space C3-C4 04/23/2024   Bone spur C3-C4 04/23/2024    Cervical radiculopathy due to degenerative joint disease of spine 04/23/2024   Chronic right shoulder pain 04/23/2024   GERD (gastroesophageal reflux disease) 01/14/2024   Encounter for breast cancer screening other than mammogram 01/14/2024   Neck pain 01/14/2024   Iron  deficiency 07/30/2023   Pharyngoesophageal dysphagia 07/30/2023   Vitamin D  deficiency 10/15/2021   Obstructive sleep apnea 05/18/2021   Abnormal mammogram 12/28/2020   Palpitations 06/13/2020   Severe menstrual cramps 06/13/2020   Menorrhagia with regular cycle 06/13/2020   Pollen-food allergy 03/23/2020   Hypertension 02/22/2020   E66.9 (BMI 30-39.9) 02/22/2020    PCP: Catherine Charlies LABOR, DO  REFERRING PROVIDER: Catherine Charlies LABOR, DO  REFERRING DIAG: M50.30 (ICD-10-CM) - DDD (degenerative disc disease), cervical M99.79 (ICD-10-CM) - Narrowing of intervertebral disc space M54.2 (ICD-10-CM) - Neck pain  THERAPY DIAG:  Cervicalgia  Chronic right shoulder pain  Cramp and spasm  Muscle weakness (generalized)  Abnormal posture  Rationale for Evaluation and Treatment: Rehabilitation  ONSET DATE: Chronic  SUBJECTIVE:  SUBJECTIVE STATEMENT: Patient verbalizes 5/10 pain currently. Her shoulder feels more sore. Tingling is better  Hand dominance: Right  PERTINENT HISTORY:  Anemia; anxiety; HTN; OA right knee  PAIN: 05/11/2024 Are you having pain? Yes: NPRS scale: 5/10 Pain location: C- spine bilateral Lt> Rt  Pain description: tight; uncomfortable Aggravating factors: driving in the car Relieving factors: stretching  PRECAUTIONS: None  RED FLAGS: None     WEIGHT BEARING RESTRICTIONS: No  FALLS:  Has patient fallen in last 6 months? Yes. Number of falls fell walking in the woods. Patient verbalized she  does not feel her balance is an issue.  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  DIAGNOSTIC FINDINGS:  IMPRESSION: Multilevel degenerative disc disease with severe narrowing of the C4-C5 disc space.  Rt Shoulder X ray 6/19 IMPRESSION: Mild acromioclavicular and minimal glenohumeral osteoarthritis.  Rt Shoulder MRI results not read yet  PATIENT SURVEYS:  Eval:  NDI 8/50 16% 02/19/2024:  Neck Disability Index score: 5 / 50 = 10.0 % 04/01/2024:  Neck Disability Index score: 6 / 50 = 12.0 % 05/11/2024 NDI : 9/50 18% QuickDASH: 36.4/100 36.4%  SENSATION: Occasional numbness and tingling in Rt arm  POSTURE: rounded shoulders and forward head  PALPATION: Increased muscle spasms of bilateral upper trap, suboccipitals, and cervical paraspinals  Decreased upper T spine mobility joint mobs Grade III   CERVICAL ROM:   Active ROM A/ROM (deg) eval A/ROM (Deg) 04/01/24  Flexion 60 65  Extension 30 50  Right lateral flexion 32 50  Left lateral flexion 43 50  Right rotation 80 80  Left rotation 85 90   (Blank rows = not tested)  UPPER EXTREMITY ROM: WFL bilateral    UPPER EXTREMITY MMT:  MMT Right eval Left eval  Shoulder flexion 4 4+  Shoulder extension    Shoulder abduction 4+ 4+  Shoulder adduction    Shoulder extension    Shoulder internal rotation    Shoulder external rotation    Middle trapezius    Lower trapezius    Elbow flexion 5 5  Elbow extension 5 5  Wrist flexion    Wrist extension    Wrist  ulnar deviation    Wrist radial deviation    Wrist pronation    Wrist supination    Grip strength     (Blank rows = not tested)    FUNCTIONAL TESTS:  Eval:  5 times sit to stand: 11.92 sec no hands  TREATMENT DATE:  05/11/2024: Discussion of goals, MD appointments, and reviewed HEP Quick DASH: 36.4/100 36.4% NDI :9/50 18% Cervical melt method (flexion/extension) x 20 Spine along foam roll : shoulder abduction, shoulder ER, D2 flexion with blue T x 10  Quadruped scap push up x 10 Seated scaption & flexion 2# DB 2 x 10 bilateral  Push up at counter 2 x 8 Cervical Mechanical traction 15 mins, 18 lbs force   04/26/2024: UBE level 2.0 x 6 min (3/3) with PT present to discuss status 3 way scapular stabilization with yellow loop x 10 each side 4D ball  rolls with 2 lb (light blue) plyo ball x 20 each direction on each UE Cervical melt method (flexion/extension) x 20 Prone shoulder extension with 2 lbs 2 x 10 both Prone shoulder row with 2 lbs 2 x 10 both Prone shoulder horizontal abduction with 2 lbs 2 x 10 both Side lying ER with 2 lbs 2 x 10 both Supine serratus punch with 2 lbs 2 x 10 Foam roll along spine D2 flexion, ER  and  shoulder abduction with green TB x 10 each  Cervical Mechanical traction 15 mins, increased to 18 lbs force    04/22/2024: UBE level 2.0 x 6 min (3/3) with PT present to discuss status 3 way scapular stabilization with blue loop x 10 each side 4D ball rolls with 2 lb (light blue) plyo ball x 20 each direction on each UE Prone drop and catch with right UE Prone shoulder extension with 2 lbs 2 x 10 both Prone shoulder row with 2 lbs 2 x 10 both Prone shoulder horizontal abduction with 2 lbs 2 x 10 both Side lying ER with 2 lbs 2 x 10 both Supine serratus punch with 2 lbs 2 x 10 Cervical Mechanical traction 15 mins, increased to 18 lbs force    PATIENT EDUCATION:  Education details: POC; DN handout & benefits; HEP; explanation of Xray results Person  educated: Patient Education method: Explanation, Demonstration, and Handouts Education comprehension: verbalized understanding, returned demonstration, and needs further education  HOME EXERCISE PROGRAM: Access Code: [email or web address] URL: [email or web address] Date: 05/11/2024 Prepared by: Kristeen Sar  Exercises - Seated Upper Trapezius Stretch  - 1 x daily - 7 x weekly - 2 sets - 20-30 hold - Seated Upper Trapezius Stretch (Mirrored)  - 1 x daily - 7 x weekly - 2 sets - 20-30 hold - Seated Levator Scapulae Stretch  - 1 x daily - 7 x weekly - 1 sets - 2 reps - 20-30 hold - Seated Levator Scapulae Stretch (Mirrored)  - 1 x daily - 7 x weekly - 1 sets - 2 reps - 20-30 hold - Seated Scapular Retraction  - 1 x daily - 7 x weekly - 2 sets - 10 reps - Seated Cervical Retraction  - 1 x daily - 7 x weekly - 2 sets - 10 reps - Doorway Pec Stretch at 90 Degrees Abduction  - 1 x daily - 7 x weekly - 1 sets - 2 reps - 20-30 hold - Standing Low Shoulder Row with Anchored Resistance  - 1 x daily - 7 x weekly - 2 sets - 10 reps - Shoulder Extension with Resistance Hands Down  - 1 x daily - 7 x weekly - 2 sets - 10 reps - Ulnar Nerve Glide- Full Arm  - 1 x daily - 7 x weekly - 2 sets - 10 reps - Ulnar Nerve Glide- Full Arm (Mirrored)  - 1 x daily - 7 x weekly - 2 sets - 10 reps - Shoulder W - External Rotation with Resistance  - 1 x daily - 7 x weekly - 2 sets - 10 reps - Isometric Standing Shoulder External Rotation in Abduction  - 1 x daily - 7 x weekly - 2 sets - 10 reps - Isometric Standing Shoulder External Rotation in Abduction (Mirrored)  - 1 x daily - 7 x weekly - 2 sets - 10 reps - Isometric Standing Shoulder Internal Rotation - 90 Degrees Abduction  - 1 x daily - 7 x weekly - 2 sets -  10 reps - Isometric Standing Shoulder Internal Rotation - 90 Degrees Abduction (Mirrored)  - 1 x daily - 7 x weekly - 2 sets - 10 reps - Cervical Retraction Prone on Elbows  - 1 x daily - 7 x weekly - 2 sets  - 10 reps - Seated Assisted Cervical Rotation with Towel  - 1 x daily - 7 x weekly - 1-2 sets - 10 reps - Seated Assisted Cervical Rotation with Towel (Mirrored)  - 1 x daily - 7 x weekly - 1-2 sets - 10 reps - Upper Cervical Extension SNAG with Strap  - 1 x daily - 7 x weekly - 1-2 sets - 10 reps - Seated Upper Extremity Nerve Glide  - 1 x daily - 7 x weekly - 1-2 sets - 10 reps - Seated Upper Extremity Nerve Glide (Mirrored)  - 1 x daily - 7 x weekly - 1-2 sets - 10 reps - Prone Shoulder Extension - Single Arm  - 2 x daily - 7 x weekly - 2 sets - 10 reps - Prone Shoulder Row  - 2 x daily - 7 x weekly - 2 sets - 10 reps - Prone Single Arm Shoulder Horizontal Abduction with Scapular Retraction and Palm Down  - 2 x daily - 7 x weekly - 2 sets - 10 reps - Sidelying Shoulder External Rotation  - 2 x daily - 7 x weekly - 2 sets - 10 reps - Single Arm Serratus Punches in Supine with Dumbbell  - 2 x daily - 7 x weekly - 2 sets - 10 reps - Standing Shoulder Flexion to 90 Degrees with Dumbbells  - 1 x daily - 7 x weekly - 2 sets - 10 reps - Scaption with Dumbbells  - 1 x daily - 7 x weekly - 2 sets - 10 reps   Condensed HEP : [id number]  ASSESSMENT:  CLINICAL IMPRESSION: Raeanna verbalizes feeling 60% better overall since starting therapy. Her neck pain has improved a lot and she feels the exercises helps her pain. Her shoulder pain however, has increased since starting therapy. Xray's showed osteoarthritis in her right shoulder and she is still waiting on the results for her MRI. Currently she is avoiding heavy lifting and repetitive exercises with her right arm. She experiences pins and needles sensation in her shoulder frequently and that depends on activity. Reviewed HEP with patient and condensed exercises. Patient will continue to benefit from skilled therapy to address remaining goals and improve right shoulder function.    OBJECTIVE IMPAIRMENTS: decreased ROM, decreased strength, increased muscle  spasms, impaired flexibility, postural dysfunction, and pain.    GOALS: Goals reviewed with patient? Yes  SHORT TERM GOALS: Target date: 02/17/2024  Patient will be independent with initial HEP. Baseline:  Goal status: Met   2.  Patient will report > or = to 30% improvement in symptoms since starting PT. Baseline:  Goal status: Met on 02/19/2024  3.  Patient will demonstrate correct seated posture. Baseline:  Goal status: Met on 02/23/24   LONG TERM GOALS: Target date: 06/22/2024  Patient will demonstrate independence in advanced HEP. Baseline:  Goal status: In progress 05/11/2024  2.  Patient will report > or = to 70% improvement in symptoms since starting PT. Baseline:  Goal status: In progress (reported 60% improvement on 05/11/24)  3.  Patient will verbalize and demonstrate self-care strategies to manage pain including tissue mobility practices and change of position. Baseline:  Goal status: In progress 05/11/2024  4.  Patient will be able to drive and look in her blind spot with < or = to 2/10 neck pain. Baseline:  Goal status: met 05/11/2024 (only tightness)  5.  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 Baseline:  Goal status: Ongoing (has started volunteering, but still has Rt shoulder pain on 05/11/24)  6.  Patient will verbalize 50% improved in pins/needles sensation in Rt arm.  Baseline:  Goal status: NEW    PLAN:  PT FREQUENCY: 1-2x/week  PT DURATION: 6 weeks  PLANNED INTERVENTIONS: 97164- PT Re-evaluation, 97110-Therapeutic exercises, 97530- Therapeutic activity, 97112- Neuromuscular re-education, 97535- Self Care, [id number]- Manual therapy, [phone]- Canalith repositioning, [id number]- Aquatic Therapy, [id number]- Electrical stimulation (unattended), [phone]- Electrical stimulation (manual), 97016- Vasopneumatic device, [id number]- Ultrasound, [id number]- Traction (mechanical), [id number]- Ionotophoresis 4mg /ml Dexamethasone, Patient/Family  education, Taping, Dry Needling, Joint mobilization, Joint manipulation, Spinal manipulation, Spinal mobilization, Vestibular training, Cryotherapy, and Moist heat  PLAN FOR NEXT SESSION:  assess updated HEP; shoulder stability & strengthening; Continue traction; manual/dry needling as indicated   Kristeen Sar, PT 05/11/24 10:38 AM [facility] [address], [address] [place], [place] [address] Phone # [phone] Fax [phone]

## 2024-05-11 NOTE — Progress Notes (Signed)
 Ben Ita Fritzsche D.[facility] [address] [place] [address] Phone: [phone]   Assessment and Plan:     1. Neck pain 2. DDD (degenerative disc disease), cervical 3. Cervical radiculopathy due to degenerative joint disease of spine 4. Chronic right shoulder pain  -Chronic with exacerbation, subsequent visit - Most consistent with neck and right shoulder pain caused by cervical stenosis and disc herniation primarily at C5-C6 on right side. - Reviewed patient's C-spine MRI which showed moderate stenosis and disc herniation at C5-C6, primarily on right side.  Mild changes at C3-C4 and C4-C5 that are less likely to be symptomatic - Start meloxicam  15 mg daily x2 weeks.  If still having pain after 2 weeks, complete 3rd-week of NSAID. May use remaining NSAID as needed once daily for pain control.  Do not to use additional over-the-counter NSAIDs (ibuprofen, naproxen , Advil, Aleve , etc.) while taking prescription NSAIDs.  May use Tylenol [phone] mg 2 to 3 times a day for breakthrough pain. - Continue HEP and physical therapy  Pertinent previous records reviewed include C-spine MRI 05/09/2024, right shoulder x-ray 05/06/2024  Follow Up: If no improvement in 3 weeks, recommend patient contact clinic and we could order right sided C5-C6 epidural (or C6-C7 if higher level is not possible) and follow-up 2 weeks after.  If improving, recommend follow-up in 4 weeks to discuss how to safely use chronic medications   Subjective:   I, Maria Goodwin, am serving as a Neurosurgeon for Maria Goodwin   Chief Complaint: neck and shoulder pain    HPI:    05/06/2024 Patient is a 43 year old female with neck and shoulder pain. Patient states she has been in Pt for a couple of months. Has had some improvement but not enough Pain is in the shoulder and pain radiates up to the neck. Pain for years . Decreased ROM. Does get numbness and tingling. No MOI. No meds for  the pain    05/18/2024 Patient states she is feeling better since last visit . But intermittent pain flares *   Relevant Historical Information: Hypertension, GERD  Additional pertinent review of systems negative.   Current Outpatient Medications:    albuterol (VENTOLIN HFA) 108 (90 Base) MCG/ACT inhaler, INHALE 2 PUFFS EVERY 4-6 HOURS AS NEEDED COUGH/WHEEZE for 30, Disp: , Rfl:    amLODipine  (NORVASC ) 10 MG tablet, Take 1 tablet (10 mg total) by mouth daily., Disp: 90 tablet, Rfl: 1   EPINEPHrine 0.3 mg/0.3 mL IJ SOAJ injection, See admin instructions., Disp: , Rfl:    Fexofenadine-Pseudoephedrine (ALLEGRA-D 24 HOUR PO), Take by mouth., Disp: , Rfl:    fluticasone  (FLONASE ) 50 MCG/ACT nasal spray, Place 2 sprays into both nostrils in the morning and at bedtime., Disp: 16 g, Rfl: 6   meloxicam  (MOBIC ) 15 MG tablet, Take 1 tablet (15 mg total) by mouth daily., Disp: 30 tablet, Rfl: 0   Multiple Vitamins-Minerals (MULTI COMPLETE PO), Take by mouth., Disp: , Rfl:    naproxen  (NAPROSYN ) 500 MG tablet, Take 1 tablet (500 mg total) by mouth 2 (two) times daily as needed for mild pain., Disp: 60 tablet, Rfl: 4   omeprazole  (PRILOSEC) 40 MG capsule, Take 1 capsule (40 mg total) by mouth daily., Disp: 90 capsule, Rfl: 1   Objective:     Vitals:   05/18/24 1101  Pulse: 92  SpO2: 100%  Weight: 170 lb (77.1 kg)  Height: 5' 1 (1.549 m)      Body mass  index is 32.12 kg/m.    Physical Exam:    Neck Exam: Cervical Spine- Posture normal Skin- normal, intact   Neuro:  Strength-   Right Left  Deltoid (C5) 5/5 5/5 Bicep/Brachioradialis (C5/6) 5/5  5/5 Wrist Extension (C6) 5/5 5/5 Tricep (C7) 5/5 5/5 Wrist Flexion (C7) 5/5 5/5 Grip (C8) 5/5 5/5 Finger Abduction (T1) 5/5 5/5   Sensation: intact to light touch in upper extremities bilaterally   Spurling's:  negative bilaterally Neck ROM: Full active ROM TTP: Cervical paraspinal, trapezius NTTP: cervical spinous processes,  thoracic  paraspinal,        Right shoulder:  No deformity, swelling or muscle wasting No scapular winging FF 180, abd 180, int 0, ext 90 NTTP over the [place], clavicle, ac, coracoid, biceps groove, humerus, deltoid, trapezius, cervical spine Positive Hawkins, empty can, O'Brien Neg neer,  crossarm, subscap liftoff, speeds Neg ant drawer, sulcus sign, apprehension Negative Spurling's test bilat FROM of neck     Electronically signed by:  Odis Goodwin D.[facility] 11:53 AM 05/18/24

## 2024-05-14 ENCOUNTER — Encounter: Payer: Self-pay | Admitting: Gastroenterology

## 2024-05-14 ENCOUNTER — Ambulatory Visit: Admitting: Gastroenterology

## 2024-05-14 VITALS — BP 119/74 | HR 66 | Temp 98.0°F | Resp 15 | Ht 61.0 in | Wt 170.0 lb

## 2024-05-14 DIAGNOSIS — K2289 Other specified disease of esophagus: Secondary | ICD-10-CM | POA: Diagnosis not present

## 2024-05-14 DIAGNOSIS — K2100 Gastro-esophageal reflux disease with esophagitis, without bleeding: Secondary | ICD-10-CM | POA: Diagnosis not present

## 2024-05-14 DIAGNOSIS — R1310 Dysphagia, unspecified: Secondary | ICD-10-CM | POA: Diagnosis not present

## 2024-05-14 DIAGNOSIS — K317 Polyp of stomach and duodenum: Secondary | ICD-10-CM | POA: Diagnosis not present

## 2024-05-14 DIAGNOSIS — K222 Esophageal obstruction: Secondary | ICD-10-CM | POA: Diagnosis not present

## 2024-05-14 DIAGNOSIS — Z1211 Encounter for screening for malignant neoplasm of colon: Secondary | ICD-10-CM | POA: Diagnosis not present

## 2024-05-14 DIAGNOSIS — K648 Other hemorrhoids: Secondary | ICD-10-CM

## 2024-05-14 DIAGNOSIS — K449 Diaphragmatic hernia without obstruction or gangrene: Secondary | ICD-10-CM

## 2024-05-14 DIAGNOSIS — K644 Residual hemorrhoidal skin tags: Secondary | ICD-10-CM

## 2024-05-14 MED ORDER — SODIUM CHLORIDE 0.9 % IV SOLN: 500.0000 mL | INTRAVENOUS | Status: DC

## 2024-05-14 NOTE — Progress Notes (Signed)
 Called to room to assist during endoscopic procedure.  Patient ID and intended procedure confirmed with present staff. Received instructions for my participation in the procedure from the performing physician.

## 2024-05-14 NOTE — Progress Notes (Signed)
To Pacu, VSS. Report to [email or web address] 

## 2024-05-14 NOTE — Progress Notes (Signed)
 Pt's states no medical or surgical changes since previsit or office visit.

## 2024-05-14 NOTE — Progress Notes (Signed)
 [facility] History and Physical   Primary Care Physician:  Catherine Charlies LABOR, DO   Reason for Procedure:  GERD, dysphagia, colon cancer screening  Plan:    EGD and colonoscopy with possible interventions as needed     HPI: Maria Goodwin is a very pleasant 43 y.o. female here for EGD and colonoscopy for GERD, dysphagia, colon cancer screening. Denies any nausea, vomiting, abdominal pain, melena or bright red blood per rectum  The risks and benefits as well as alternatives of endoscopic procedure(s) have been discussed and reviewed. All questions answered. The patient agrees to proceed.    Past Medical History:  Diagnosis Date   Allergic reaction 02/23/2020   Itchy mouth with watermelon and cucumber.  Refer to allergist.   Allergic rhinitis    Allergy    Anemia 2021   Anxiety Since childhood   Blood in stool 2010   Patient reports hospitalization for blood in stool, diarrhea and extreme dehydration.  She was hospitalized at this time and a colonoscopy was completed.   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   Frequent headaches    GERD (gastroesophageal reflux disease)    Heart murmur    Hypertension    Iron  deficiency anemia    Irregular menstrual bleeding 2015   Numbness and tingling of upper extremity 02/23/2020   Osteoarthritis of knee 2016   Other allergic rhinitis 03/23/2020   Patellofemoral pain syndrome 04/2015   Tinea corporis    Tonsil stone 2016   Vitamin D  deficiency     Past Surgical History:  Procedure Laterality Date   BREAST BIOPSY Left 08/2016   fibroadenoma   BREAST BIOPSY Left 01/15/2021   BREAST BIOPSY Left 2023   BREAST BIOPSY Left 04/16/2024   US  LT BREAST BX W LOC DEV 1ST LESION IMG BX SPEC US  GUIDE 04/16/2024 [facility]   CESAREAN SECTION  2012   WISDOM TOOTH EXTRACTION      Prior to Admission medications   Medication Sig Start Date End  Date Taking? Authorizing Provider  amLODipine  (NORVASC ) 10 MG tablet Take 1 tablet (10 mg total) by mouth daily. 01/14/24  Yes Kuneff, Renee A, DO  Fexofenadine-Pseudoephedrine (ALLEGRA-D 24 HOUR PO) Take by mouth.   Yes [provider]  omeprazole  (PRILOSEC) 40 MG capsule Take 1 capsule (40 mg total) by mouth daily. 01/14/24  Yes Kuneff, Renee A, DO  albuterol (VENTOLIN HFA) 108 (90 Base) MCG/ACT inhaler INHALE 2 PUFFS EVERY 4-6 HOURS AS NEEDED COUGH/WHEEZE for 30    [provider]  EPINEPHrine 0.3 mg/0.3 mL IJ SOAJ injection See admin instructions. 05/12/23   [provider]  fluticasone  (FLONASE ) 50 MCG/ACT nasal spray Place 2 sprays into both nostrils in the morning and at bedtime. 09/19/23   Soldatova, Liuba, MD  meloxicam  (MOBIC ) 15 MG tablet Take 1 tablet (15 mg total) by mouth daily. 05/06/24   Leonce Katz, DO  Multiple Vitamins-Minerals (MULTI COMPLETE PO) Take by mouth.    [provider]  naproxen  (NAPROSYN ) 500 MG tablet Take 1 tablet (500 mg total) by mouth 2 (two) times daily as needed for mild pain. 06/04/23   Chrzanowski, Jami B, NP    Current Outpatient Medications  Medication Sig Dispense Refill   amLODipine  (NORVASC ) 10 MG tablet Take 1 tablet (10 mg total) by mouth daily. 90 tablet 1   Fexofenadine-Pseudoephedrine (ALLEGRA-D 24 HOUR PO) Take by mouth.  omeprazole  (PRILOSEC) 40 MG capsule Take 1 capsule (40 mg total) by mouth daily. 90 capsule 1   albuterol (VENTOLIN HFA) 108 (90 Base) MCG/ACT inhaler INHALE 2 PUFFS EVERY 4-6 HOURS AS NEEDED COUGH/WHEEZE for 30     EPINEPHrine 0.3 mg/0.3 mL IJ SOAJ injection See admin instructions.     fluticasone  (FLONASE ) 50 MCG/ACT nasal spray Place 2 sprays into both nostrils in the morning and at bedtime. 16 g 6   meloxicam  (MOBIC ) 15 MG tablet Take 1 tablet (15 mg total) by mouth daily. 30 tablet 0   Multiple Vitamins-Minerals (MULTI COMPLETE PO) Take by mouth.     naproxen  (NAPROSYN ) 500 MG  tablet Take 1 tablet (500 mg total) by mouth 2 (two) times daily as needed for mild pain. 60 tablet 4   Current Facility-Administered Medications  Medication Dose Route Frequency Provider Last Rate Last Admin   0.9 %  sodium chloride infusion  500 mL Intravenous Continuous Tiki Tucciarone V, MD        Allergies as of 05/14/2024   (No Known Allergies)    Family History  Problem Relation Age of Onset   Arthritis Mother    Hyperlipidemia Mother    Hypertension Mother    Learning disabilities Mother    Allergic rhinitis Mother    ALS Mother    Alcohol abuse Father    Depression Father    Vision loss Father    Asthma Brother    Hyperlipidemia Brother    Hypertension Brother    Allergic rhinitis Brother    Asthma Brother    Hyperlipidemia Brother    Hypertension Brother    Allergic rhinitis Brother    Bladder Cancer Maternal Grandmother    Early death Maternal Grandmother    Skin cancer Maternal Grandfather    Early death Maternal Grandfather    Stroke Maternal Grandfather    Hypertension Maternal Grandfather    Skin cancer Paternal Grandmother    Skin cancer Paternal Grandfather    Allergic rhinitis Son    Eczema Son    Urticaria Son    Breast cancer Neg Hx    Colon cancer Neg Hx    Colon polyps Neg Hx    Esophageal cancer Neg Hx    Rectal cancer Neg Hx    Stomach cancer Neg Hx     Social History   Socioeconomic History   Marital status: Married    Spouse name: Not on file   Number of children: Not on file   Years of education: Not on file   Highest education level: 12th grade  Occupational History   Not on file  Tobacco Use   Smoking status: Never   Smokeless tobacco: Never  Vaping Use   Vaping status: Never Used  Substance and Sexual Activity   Alcohol use: Yes    Comment: socially    Drug use: Never   Sexual activity: Yes    Partners: Male    Birth control/protection: None, Surgical    Comment: husband vasectomy  Other Topics Concern   Not on  file  Social History Narrative   Marital status/children/pets: Married, 1 child.   Education/employment: High school graduate, stay-at-home mom   Safety:      -Wears a bicycle helmet riding a bike: Yes     -smoke alarm in the home:Yes     - wears seatbelt: Yes     - Feels safe in their relationships: Yes   Social Drivers of Corporate investment banker  Strain: Low Risk  (01/13/2024)   Overall Financial Resource Strain (CARDIA)    Difficulty of Paying Living Expenses: Not very hard  Food Insecurity: No Food Insecurity (01/13/2024)   Hunger Vital Sign    Worried About Running Out of Food in the Last Year: Never true    Ran Out of Food in the Last Year: Never true  Transportation Needs: No Transportation Needs (01/13/2024)   PRAPARE - Administrator, Civil Service (Medical): No    Lack of Transportation (Non-Medical): No  Physical Activity: Sufficiently Active (01/13/2024)   Exercise Vital Sign    Days of Exercise per Week: 4 days    Minutes of Exercise per Session: 60 min  Stress: No Stress Concern Present (01/13/2024)   [employer] of Occupational Health - Occupational Stress Questionnaire    Feeling of Stress : Not at all  Social Connections: Moderately Integrated (01/13/2024)   Social Connection and Isolation Panel    Frequency of Communication with Friends and Family: More than three times a week    Frequency of Social Gatherings with Friends and Family: Twice a week    Attends Religious Services: Never    Database administrator or Organizations: Yes    Attends Engineer, structural: More than 4 times per year    Marital Status: Married  Catering manager Violence: Not on file    Review of Systems:  All other review of systems negative except as mentioned in the HPI.  Physical Exam: Vital signs in last 24 hours: BP 114/67   Pulse 72   Temp 98 F (36.7 C) (Temporal)   Ht 5' 1 (1.549 m)   Wt 170 lb (77.1 kg)   LMP 05/03/2024   SpO2 100%   BMI  32.12 kg/m  General:   Alert, NAD Lungs:  Clear .   Heart:  Regular rate and rhythm Abdomen:  Soft, nontender and nondistended. Neuro/Psych:  Alert and cooperative. Normal mood and affect. A and O x 3  Reviewed labs, radiology imaging, old records and pertinent past GI work up  Patient is appropriate for planned procedure(s) and anesthesia in an ambulatory setting   K. Veena Dreux Mcgroarty , MD [phone]

## 2024-05-14 NOTE — Patient Instructions (Addendum)
 Colonoscopy Resume previous diet Continue present medications Repeat colonoscopy in 10 years  Endoscopy Resume previous diet Continue present medications Await pathology results Continue omeprazole      YOU HAD AN ENDOSCOPIC PROCEDURE TODAY AT THE [facility]:   Refer to the procedure report that was given to you for any specific questions about what was found during the examination.  If the procedure report does not answer your questions, please call your gastroenterologist to clarify.  If you requested that your care partner not be given the details of your procedure findings, then the procedure report has been included in a sealed envelope for you to review at your convenience later.  YOU SHOULD EXPECT: Some feelings of bloating in the abdomen. Passage of more gas than usual.  Walking can help get rid of the air that was put into your GI tract during the procedure and reduce the bloating. If you had a lower endoscopy (such as a colonoscopy or flexible sigmoidoscopy) you may notice spotting of blood in your stool or on the toilet paper. If you underwent a bowel prep for your procedure, you may not have a normal bowel movement for a few days.  Please Note:  You might notice some irritation and congestion in your nose or some drainage.  This is from the oxygen used during your procedure.  There is no need for concern and it should clear up in a day or so.  SYMPTOMS TO REPORT IMMEDIATELY:  Following lower endoscopy (colonoscopy or flexible sigmoidoscopy):  Excessive amounts of blood in the stool  Significant tenderness or worsening of abdominal pains  Swelling of the abdomen that is new, acute  Fever of 100F or higher  Following upper endoscopy (EGD)  Vomiting of blood or coffee ground material  New chest pain or pain under the shoulder blades  Painful or persistently difficult swallowing  New shortness of breath  Fever of 100F or higher  Black, tarry-looking  stools  For urgent or emergent issues, a gastroenterologist can be reached at any hour by calling [phone]. Do not use MyChart messaging for urgent concerns.    DIET:  We do recommend a small meal at first, but then you may proceed to your regular diet.  Drink plenty of fluids but you should avoid alcoholic beverages for 24 hours.  ACTIVITY:  You should plan to take it easy for the rest of today and you should NOT DRIVE or use heavy machinery until tomorrow (because of the sedation medicines used during the test).    FOLLOW UP: Our staff will call the number listed on your records the next business day following your procedure.  We will call around 7:15- 8:00 am to check on you and address any questions or concerns that you may have regarding the information given to you following your procedure. If we do not reach you, we will leave a message.     If any biopsies were taken you will be contacted by phone or by letter within the next 1-3 weeks.  Please call us  at [phone] if you have not heard about the biopsies in 3 weeks.    SIGNATURES/CONFIDENTIALITY: You and/or your care partner have signed paperwork which will be entered into your electronic medical record.  These signatures attest to the fact that that the information above on your After Visit Summary has been reviewed and is understood.  Full responsibility of the confidentiality of this discharge information lies with you and/or your care-partner.

## 2024-05-14 NOTE — Op Note (Signed)
 [facility] Patient Name: Maria Goodwin Procedure Date: 05/14/2024 9:13 AM MRN: [id number] Endoscopist: Gustav ALONSO Mcgee , MD, [id number] Age: 43 Referring MD:  Date of Birth: Apr 17, 1981 Gender: Female Account #: [id number] Procedure:                Upper GI endoscopy Indications:              Dysphagia, Gastro-esophageal reflux disease Medicines:                Monitored Anesthesia Care Procedure:                Pre-Anesthesia Assessment:                           - Prior to the procedure, a History and Physical                            was performed, and patient medications and                            allergies were reviewed. The patient's tolerance of                            previous anesthesia was also reviewed. The risks                            and benefits of the procedure and the sedation                            options and risks were discussed with the patient.                            All questions were answered, and informed consent                            was obtained. Prior Anticoagulants: The patient has                            taken no anticoagulant or antiplatelet agents. ASA                            Grade Assessment: II - A patient with mild systemic                            disease. After reviewing the risks and benefits,                            the patient was deemed in satisfactory condition to                            undergo the procedure.                           After obtaining informed consent, the endoscope was  passed under direct vision. Throughout the                            procedure, the patient's blood pressure, pulse, and                            oxygen saturations were monitored continuously. The                            GIF [id number] #[id number] was introduced through the                            mouth, and advanced to the second part of duodenum.                            The  upper GI endoscopy was accomplished without                            difficulty. The patient tolerated the procedure                            well. Scope In: Scope Out: Findings:                 One benign-appearing, intrinsic mild stenosis was                            found 36 to 37 cm from the incisors. This stenosis                            measured less than one cm (in length). The stenosis                            was traversed. The scope was withdrawn. Dilation                            was performed with a Maloney dilator with no                            resistance at 54 Fr. The dilation site was examined                            following endoscope reinsertion and showed no                            change.                           LA Grade B (one or more mucosal breaks greater than                            5 mm, not extending between the tops of two mucosal  folds) esophagitis with no bleeding was found 30 to                            38 cm from the incisors. Biopsies were obtained                            from the proximal and distal esophagus with cold                            forceps for histology of suspected eosinophilic                            esophagitis.                           A 3 cm hiatal hernia was present.                           Multiple 5 to 15 mm pedunculated and sessile polyps                            with no bleeding and no stigmata of recent bleeding                            were found in the gastric fundus and in the gastric                            body. Biopsies were taken with a cold forceps for                            histology.                           The exam of the stomach was otherwise normal.                           The examined duodenum was normal. Complications:            No immediate complications. Estimated Blood Loss:     Estimated blood loss was minimal. Impression:                - Benign-appearing esophageal stenosis. Dilated.                           - LA Grade B reflux esophagitis with no bleeding.                           - 3 cm hiatal hernia.                           - Multiple gastric polyps. Biopsied.                           - Normal examined duodenum.                           -  Biopsies were taken with a cold forceps for                            evaluation of eosinophilic esophagitis. Recommendation:           - Resume previous diet.                           - Continue present medications.                           - Await pathology results.                           - Continue Omeprazole  Jarika Robben V. Rey Fors, MD 05/14/2024 9:57:28 AM This report has been signed electronically.

## 2024-05-14 NOTE — Op Note (Signed)
 [facility] Patient Name: Maria Goodwin Procedure Date: 05/14/2024 9:13 AM MRN: [id number] Endoscopist: Gustav ALONSO Mcgee , MD, [id number] Age: 43 Referring MD:  Date of Birth: [birth year] Gender: Female Account #: [id number] Procedure:                Colonoscopy Indications:              Screening for colorectal malignant neoplasm Medicines:                Monitored Anesthesia Care Procedure:                Pre-Anesthesia Assessment:                           - Prior to the procedure, a History and Physical                            was performed, and patient medications and                            allergies were reviewed. The patient's tolerance of                            previous anesthesia was also reviewed. The risks                            and benefits of the procedure and the sedation                            options and risks were discussed with the patient.                            All questions were answered, and informed consent                            was obtained. Prior Anticoagulants: The patient has                            taken no anticoagulant or antiplatelet agents. ASA                            Grade Assessment: II - A patient with mild systemic                            disease. After reviewing the risks and benefits,                            the patient was deemed in satisfactory condition to                            undergo the procedure.                           After obtaining informed consent, the colonoscope  was passed under direct vision. Throughout the                            procedure, the patient's blood pressure, pulse, and                            oxygen saturations were monitored continuously. The                            Olympus Scope SN: [id number] was introduced through                            the anus and advanced to the the cecum, identified                            by  appendiceal orifice and ileocecal valve. The                            colonoscopy was performed without difficulty. The                            patient tolerated the procedure well. The quality                            of the bowel preparation was good. The ileocecal                            valve, appendiceal orifice, and rectum were                            photographed. Scope In: 9:33:20 AM Scope Out: 9:44:20 AM Scope Withdrawal Time: 0 hours 7 minutes 57 seconds  Total Procedure Duration: 0 hours 11 minutes 0 seconds  Findings:                 The perianal and digital rectal examinations were                            normal.                           Non-bleeding external and internal hemorrhoids were                            found during retroflexion. The hemorrhoids were                            small.                           The exam was otherwise without abnormality. Complications:            No immediate complications. Estimated Blood Loss:     Estimated blood loss: none. Impression:               - Non-bleeding external and internal hemorrhoids.                           -  The examination was otherwise normal.                           - No specimens collected. Recommendation:           - Resume previous diet.                           - Continue present medications.                           - Repeat colonoscopy in 10 years for surveillance. Reigan Tolliver V. Day Deery, MD 05/14/2024 9:52:48 AM This report has been signed electronically.

## 2024-05-17 ENCOUNTER — Telehealth: Payer: Self-pay

## 2024-05-17 NOTE — Telephone Encounter (Signed)
  Follow up Call-     05/14/2024    7:58 AM  Call back number  Post procedure Call Back phone  # [phone]  Permission to leave phone message Yes     Patient questions:  Do you have a fever, pain , or abdominal swelling? No. Pain Score  0 *  Have you tolerated food without any problems? Yes.    Have you been able to return to your normal activities? Yes.    Do you have any questions about your discharge instructions: Diet   No. Medications  No. Follow up visit  No.  Do you have questions or concerns about your Care? No.  Actions: * If pain score is 4 or above: No action needed, pain <4.

## 2024-05-18 ENCOUNTER — Ambulatory Visit: Admitting: Sports Medicine

## 2024-05-18 VITALS — HR 92 | Ht 61.0 in | Wt 170.0 lb

## 2024-05-18 DIAGNOSIS — G8929 Other chronic pain: Secondary | ICD-10-CM | POA: Diagnosis not present

## 2024-05-18 DIAGNOSIS — M542 Cervicalgia: Secondary | ICD-10-CM

## 2024-05-18 DIAGNOSIS — M25511 Pain in right shoulder: Secondary | ICD-10-CM

## 2024-05-18 DIAGNOSIS — M4722 Other spondylosis with radiculopathy, cervical region: Secondary | ICD-10-CM

## 2024-05-18 DIAGNOSIS — J301 Allergic rhinitis due to pollen: Secondary | ICD-10-CM | POA: Diagnosis not present

## 2024-05-18 DIAGNOSIS — J3081 Allergic rhinitis due to animal (cat) (dog) hair and dander: Secondary | ICD-10-CM | POA: Diagnosis not present

## 2024-05-18 DIAGNOSIS — M5030 Other cervical disc degeneration, unspecified cervical region: Secondary | ICD-10-CM | POA: Diagnosis not present

## 2024-05-18 DIAGNOSIS — J3089 Other allergic rhinitis: Secondary | ICD-10-CM | POA: Diagnosis not present

## 2024-05-18 NOTE — Patient Instructions (Signed)
-   Start meloxicam  15 mg daily x2 weeks.  If still having pain after 2 weeks, complete 3rd-week of NSAID. May use remaining NSAID as needed once daily for pain control.  Do not to use additional over-the-counter NSAIDs (ibuprofen, naproxen , Advil, Aleve , etc.) while taking prescription NSAIDs.  May use Tylenol [id number] mg 2 to 3 times a day for breakthrough pain. Continue HEP and PT If no improvement in 3 weeks call and ask for epidural CSI

## 2024-05-20 ENCOUNTER — Encounter: Admitting: Physical Therapy

## 2024-05-20 LAB — SURGICAL PATHOLOGY

## 2024-05-25 DIAGNOSIS — J301 Allergic rhinitis due to pollen: Secondary | ICD-10-CM | POA: Diagnosis not present

## 2024-05-25 DIAGNOSIS — J3081 Allergic rhinitis due to animal (cat) (dog) hair and dander: Secondary | ICD-10-CM | POA: Diagnosis not present

## 2024-05-25 DIAGNOSIS — J3089 Other allergic rhinitis: Secondary | ICD-10-CM | POA: Diagnosis not present

## 2024-06-01 DIAGNOSIS — J3081 Allergic rhinitis due to animal (cat) (dog) hair and dander: Secondary | ICD-10-CM | POA: Diagnosis not present

## 2024-06-01 DIAGNOSIS — J301 Allergic rhinitis due to pollen: Secondary | ICD-10-CM | POA: Diagnosis not present

## 2024-06-01 DIAGNOSIS — J3089 Other allergic rhinitis: Secondary | ICD-10-CM | POA: Diagnosis not present

## 2024-06-03 ENCOUNTER — Encounter: Payer: Self-pay | Admitting: Rehabilitative and Restorative Service Providers"

## 2024-06-03 ENCOUNTER — Ambulatory Visit: Attending: Family Medicine | Admitting: Rehabilitative and Restorative Service Providers"

## 2024-06-03 ENCOUNTER — Encounter: Payer: Self-pay | Admitting: Gastroenterology

## 2024-06-03 DIAGNOSIS — R293 Abnormal posture: Secondary | ICD-10-CM | POA: Insufficient documentation

## 2024-06-03 DIAGNOSIS — R252 Cramp and spasm: Secondary | ICD-10-CM | POA: Diagnosis not present

## 2024-06-03 DIAGNOSIS — M25511 Pain in right shoulder: Secondary | ICD-10-CM | POA: Diagnosis not present

## 2024-06-03 DIAGNOSIS — M542 Cervicalgia: Secondary | ICD-10-CM | POA: Insufficient documentation

## 2024-06-03 DIAGNOSIS — G8929 Other chronic pain: Secondary | ICD-10-CM | POA: Diagnosis not present

## 2024-06-03 DIAGNOSIS — M6281 Muscle weakness (generalized): Secondary | ICD-10-CM | POA: Diagnosis not present

## 2024-06-03 NOTE — Therapy (Signed)
 OUTPATIENT PHYSICAL THERAPY TREATMENT NOTE / RE-CERTIFICATION   Patient Name: Maria Goodwin MRN: [id number] DOB:02-Sep-1981, 43 y.o., female Today's Date: 06/03/2024  END OF SESSION:  PT End of Session - 06/03/24 0936     Visit Number 19    Date for PT Re-Evaluation 06/22/24    Authorization Type [employer] (50vl)    PT Start Time 0932    PT Stop Time 1025    PT Time Calculation (min) 53 min    Activity Tolerance Patient tolerated treatment well    Behavior During Therapy [facility] for tasks assessed/performed                 Past Medical History:  Diagnosis Date   Allergic reaction 02/23/2020   Itchy mouth with watermelon and cucumber.  Refer to allergist.   Allergic rhinitis    Allergy    Anemia 2021   Anxiety Since childhood   Blood in stool 2010   Patient reports hospitalization for blood in stool, diarrhea and extreme dehydration.  She was hospitalized at this time and a colonoscopy was completed.   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   Frequent headaches    GERD (gastroesophageal reflux disease)    Heart murmur    Hypertension    Iron  deficiency anemia    Irregular menstrual bleeding 2015   Numbness and tingling of upper extremity 02/23/2020   Osteoarthritis of knee 2016   Other allergic rhinitis 03/23/2020   Patellofemoral pain syndrome 04/2015   Tinea corporis    Tonsil stone 2016   Vitamin D  deficiency    Past Surgical History:  Procedure Laterality Date   BREAST BIOPSY Left 08/2016   fibroadenoma   BREAST BIOPSY Left 01/15/2021   BREAST BIOPSY Left 2023   BREAST BIOPSY Left 04/16/2024   US  LT BREAST BX W LOC DEV 1ST LESION IMG BX SPEC US  GUIDE 04/16/2024 [facility] MAMMO[facility]   CESAREAN SECTION  2012   WISDOM TOOTH EXTRACTION     Patient Active Problem List   Diagnosis Date Noted   Narrowing of intervertebral disc space C3-C4 04/23/2024   Bone spur C3-C4 04/23/2024    Cervical radiculopathy due to degenerative joint disease of spine 04/23/2024   Chronic right shoulder pain 04/23/2024   GERD (gastroesophageal reflux disease) 01/14/2024   Encounter for breast cancer screening other than mammogram 01/14/2024   Neck pain 01/14/2024   Iron  deficiency 07/30/2023   Pharyngoesophageal dysphagia 07/30/2023   Vitamin D  deficiency 10/15/2021   Obstructive sleep apnea 05/18/2021   Abnormal mammogram 12/28/2020   Palpitations 06/13/2020   Severe menstrual cramps 06/13/2020   Menorrhagia with regular cycle 06/13/2020   Pollen-food allergy 03/23/2020   Hypertension 02/22/2020   E66.9 (BMI 30-39.9) 02/22/2020    PCP: Catherine Charlies LABOR, DO  REFERRING PROVIDER: Catherine Charlies LABOR, DO  REFERRING DIAG: M50.30 (ICD-10-CM) - DDD (degenerative disc disease), cervical M99.79 (ICD-10-CM) - Narrowing of intervertebral disc space M54.2 (ICD-10-CM) - Neck pain  THERAPY DIAG:  Cervicalgia  Chronic right shoulder pain  Cramp and spasm  Muscle weakness (generalized)  Abnormal posture  Rationale for Evaluation and Treatment: Rehabilitation  ONSET DATE: Chronic  SUBJECTIVE:  SUBJECTIVE STATEMENT: Patient states that the Sports Medicine MD recommended that she take anti-inflammatories for 2 weeks and this is the last day of that and she feels that it has helped some. She said that he advised her to take for a 3rd week if she was feeling better, and she is going to do that.  Hand dominance: Right  PERTINENT HISTORY:  Anemia; anxiety; HTN; OA right knee  PAIN: 05/11/2024 Are you having pain? Yes: NPRS scale: 4-5/10 Pain location: C- spine bilateral Lt> Rt  Pain description: tight; uncomfortable Aggravating factors: driving in the car Relieving factors:  stretching  PRECAUTIONS: None  RED FLAGS: None     WEIGHT BEARING RESTRICTIONS: No  FALLS:  Has patient fallen in last 6 months? Yes. Number of falls fell walking in the woods. Patient verbalized she does not feel her balance is an issue.  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  DIAGNOSTIC FINDINGS:  IMPRESSION: Multilevel degenerative disc disease with severe narrowing of the C4-C5 disc space.  Rt Shoulder X ray 6/19 IMPRESSION: Mild acromioclavicular and minimal glenohumeral osteoarthritis.  Rt Shoulder MRI results not read yet  PATIENT SURVEYS:  Eval:  NDI 8/50 16% 02/19/2024:  Neck Disability Index score: 5 / 50 = 10.0 % 04/01/2024:  Neck Disability Index score: 6 / 50 = 12.0 % 05/11/2024 NDI : 9/50 18% QuickDASH: 36.4/100 36.4%  SENSATION: Occasional numbness and tingling in Rt arm  POSTURE: rounded shoulders and forward head  PALPATION: Increased muscle spasms of bilateral upper trap, suboccipitals, and cervical paraspinals  Decreased upper T spine mobility joint mobs Grade III   CERVICAL ROM:   Active ROM A/ROM (deg) eval A/ROM (Deg) 04/01/24  Flexion 60 65  Extension 30 50  Right lateral flexion 32 50  Left lateral flexion 43 50  Right rotation 80 80  Left rotation 85 90   (Blank rows = not tested)  UPPER EXTREMITY ROM: WFL bilateral    UPPER EXTREMITY MMT:  MMT Right eval Left eval  Shoulder flexion 4 4+  Shoulder extension    Shoulder abduction 4+ 4+  Shoulder adduction     Shoulder extension    Shoulder internal rotation    Shoulder external rotation    Middle trapezius    Lower trapezius    Elbow flexion 5 5  Elbow extension 5 5  Wrist flexion    Wrist extension    Wrist ulnar deviation    Wrist radial deviation    Wrist pronation    Wrist supination    Grip strength     (Blank rows = not tested)    FUNCTIONAL TESTS:  Eval:  5 times sit to stand: 11.92 sec no hands  TREATMENT DATE:  06/03/2024: Manual:  soft tissue mobilization to cervical and thoracic paraspinals to assess for trigger points and perform manual trigger point release, as needed. Cervical melt method (flexion/extension) x 20 Hooklying on foam roll/bolster with cuing for relaxation x1 min Hooklying on foam roll/bolster snow angels x10 (with cuing for technique) Hooklying on foam roll/bolster performing alt shoulder  flexion (swimming) x10 Supine serratus punch with 4# dumbbell 2x10 Counter push ups 2x10 Standing rows with blue tband 2x10 Shoulder scaption on blue physioball 2x10 (with cuing for technique and relaxing upper traps) Supine deep cervical flexion 3x5 Cervical Mechanical traction 15 mins, 18 lbs force   05/11/2024: Discussion of goals, MD appointments, and reviewed HEP Quick DASH: 36.4/100 36.4% NDI :9/50 18% Cervical melt method (flexion/extension) x 20 Spine along foam roll : shoulder abduction, shoulder ER, D2 flexion with blue T x 10  Quadruped scap push up x 10 Seated scaption & flexion 2# DB 2 x 10 bilateral  Push up at counter 2 x 8 Cervical Mechanical traction 15 mins, 18 lbs force   04/26/2024: UBE level 2.0 x 6 min (3/3) with PT present to discuss status 3 way scapular stabilization with yellow loop x 10 each side 4D ball rolls with 2 lb (light blue) plyo ball x 20 each direction on each UE Cervical melt method (flexion/extension) x 20 Prone shoulder extension with 2 lbs 2 x 10 both Prone shoulder row with 2 lbs 2 x 10 both Prone shoulder  horizontal abduction with 2 lbs 2 x 10 both Side lying ER with 2 lbs 2 x 10 both Supine serratus punch with 2 lbs 2 x 10 Foam roll along spine D2 flexion, ER  and  shoulder abduction with green TB x 10 each  Cervical Mechanical traction 15 mins, increased to 18 lbs force    PATIENT EDUCATION:  Education details: POC; DN handout & benefits; HEP; explanation of Xray results Person educated: Patient Education method: Explanation, Demonstration, and Handouts Education comprehension: verbalized understanding, returned demonstration, and needs further education  HOME EXERCISE PROGRAM: Access Code: [email or web address] URL: [email or web address] Date: 05/11/2024 Prepared by: Kristeen Sar  Exercises - Seated Upper Trapezius Stretch  - 1 x daily - 7 x weekly - 2 sets - 20-30 hold - Seated Upper Trapezius Stretch (Mirrored)  - 1 x daily - 7 x weekly - 2 sets - 20-30 hold - Seated Levator Scapulae Stretch  - 1 x daily - 7 x weekly - 1 sets - 2 reps - 20-30 hold - Seated Levator Scapulae Stretch (Mirrored)  - 1 x daily - 7 x weekly - 1 sets - 2 reps - 20-30 hold - Seated Scapular Retraction  - 1 x daily - 7 x weekly - 2 sets - 10 reps - Seated Cervical Retraction  - 1 x daily - 7 x weekly - 2 sets - 10 reps - Doorway Pec Stretch at 90 Degrees Abduction  - 1 x daily - 7 x weekly - 1 sets - 2 reps - 20-30 hold - Standing Low Shoulder Row with Anchored Resistance  - 1 x daily - 7 x weekly - 2 sets - 10 reps - Shoulder Extension with Resistance Hands Down  - 1 x daily - 7 x weekly - 2 sets - 10 reps - Ulnar Nerve Glide- Full Arm  - 1 x daily - 7 x weekly - 2 sets - 10 reps - Ulnar Nerve Glide- Full Arm (Mirrored)  - 1 x daily - 7 x weekly - 2 sets - 10 reps - Shoulder W - External Rotation with Resistance  - 1 x daily - 7 x weekly - 2 sets - 10 reps - Isometric Standing Shoulder External Rotation in Abduction  - 1 x daily - 7 x weekly - 2 sets - 10 reps - Isometric Standing Shoulder External  Rotation in Abduction (Mirrored)  -  1 x daily - 7 x weekly - 2 sets - 10 reps - Isometric Standing Shoulder Internal Rotation - 90 Degrees Abduction  - 1 x daily - 7 x weekly - 2 sets - 10 reps - Isometric Standing Shoulder Internal Rotation - 90 Degrees Abduction (Mirrored)  - 1 x daily - 7 x weekly - 2 sets - 10 reps - Cervical Retraction Prone on Elbows  - 1 x daily - 7 x weekly - 2 sets - 10 reps - Seated Assisted Cervical Rotation with Towel  - 1 x daily - 7 x weekly - 1-2 sets - 10 reps - Seated Assisted Cervical Rotation with Towel (Mirrored)  - 1 x daily - 7 x weekly - 1-2 sets - 10 reps - Upper Cervical Extension SNAG with Strap  - 1 x daily - 7 x weekly - 1-2 sets - 10 reps - Seated Upper Extremity Nerve Glide  - 1 x daily - 7 x weekly - 1-2 sets - 10 reps - Seated Upper Extremity Nerve Glide (Mirrored)  - 1 x daily - 7 x weekly - 1-2 sets - 10 reps - Prone Shoulder Extension - Single Arm  - 2 x daily - 7 x weekly - 2 sets - 10 reps - Prone Shoulder Row  - 2 x daily - 7 x weekly - 2 sets - 10 reps - Prone Single Arm Shoulder Horizontal Abduction with Scapular Retraction and Palm Down  - 2 x daily - 7 x weekly - 2 sets - 10 reps - Sidelying Shoulder External Rotation  - 2 x daily - 7 x weekly - 2 sets - 10 reps - Single Arm Serratus Punches in Supine with Dumbbell  - 2 x daily - 7 x weekly - 2 sets - 10 reps - Standing Shoulder Flexion to 90 Degrees with Dumbbells  - 1 x daily - 7 x weekly - 2 sets - 10 reps - Scaption with Dumbbells  - 1 x daily - 7 x weekly - 2 sets - 10 reps   Condensed HEP : [id number]  ASSESSMENT:  CLINICAL IMPRESSION: [place] presents to skilled PT stating that she has noticed improvement since she started taking her anti-inflammatory medication.  Patient continues to have tightness in her cervical and scapular region.  Patient continues to report that traction helps with decreased pain.  Patient able to progress during session with increased strengthening and only  requires cuing for newer exercises for technique and postural alignment.  Patient continues to require skilled PT to progress towards goal related activities.   OBJECTIVE IMPAIRMENTS: decreased ROM, decreased strength, increased muscle spasms, impaired flexibility, postural dysfunction, and pain.    GOALS: Goals reviewed with patient? Yes  SHORT TERM GOALS: Target date: 02/17/2024  Patient will be independent with initial HEP. Baseline:  Goal status: Met   2.  Patient will report > or = to 30% improvement in symptoms since starting PT. Baseline:  Goal status: Met on 02/19/2024  3.  Patient will demonstrate correct seated posture. Baseline:  Goal status: Met on 02/23/24   LONG TERM GOALS: Target date: 06/22/2024  Patient will demonstrate independence in advanced HEP. Baseline:  Goal status: In progress 05/11/2024  2.  Patient will report > or = to 70% improvement in symptoms since starting PT. Baseline:  Goal status: In progress (reported 60% improvement on 05/11/24)  3.  Patient will verbalize and demonstrate self-care strategies to manage pain including tissue mobility practices and change of position. Baseline:  Goal status: In progress 05/11/2024   4.  Patient will be able to drive and look in her blind spot with < or = to 2/10 neck pain. Baseline:  Goal status: met 05/11/2024 (only tightness)  5.  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 Baseline:  Goal status: Ongoing (has started volunteering, but still has Rt shoulder pain on 05/11/24)  6.  Patient will verbalize 50% improved in pins/needles sensation in Rt arm.  Baseline:  Goal status: NEW    PLAN:  PT FREQUENCY: 1-2x/week  PT DURATION: 6 weeks  PLANNED INTERVENTIONS: 97164- PT Re-evaluation, 97110-Therapeutic exercises, 97530- Therapeutic activity, 97112- Neuromuscular re-education, 97535- Self Care, [id number]- Manual therapy, [phone]- Canalith repositioning, [id number]-  Aquatic Therapy, [id number]- Electrical stimulation (unattended), [phone]- Electrical stimulation (manual), 97016- Vasopneumatic device, [id number]- Ultrasound, [id number]- Traction (mechanical), [id number]- Ionotophoresis 4mg /ml Dexamethasone, Patient/Family education, Taping, Dry Needling, Joint mobilization, Joint manipulation, Spinal manipulation, Spinal mobilization, Vestibular training, Cryotherapy, and Moist heat  PLAN FOR NEXT SESSION:  assess updated HEP; shoulder stability & strengthening; Continue traction; manual/dry needling as indicated   Jarrell Laming, PT, DPT 06/03/24, 11:56 AM  [facility] [address], [address] [place], [place] [address] Phone # [phone] Fax [phone]

## 2024-06-04 DIAGNOSIS — L814 Other melanin hyperpigmentation: Secondary | ICD-10-CM | POA: Diagnosis not present

## 2024-06-04 DIAGNOSIS — L821 Other seborrheic keratosis: Secondary | ICD-10-CM | POA: Diagnosis not present

## 2024-06-04 DIAGNOSIS — D225 Melanocytic nevi of trunk: Secondary | ICD-10-CM | POA: Diagnosis not present

## 2024-06-04 DIAGNOSIS — L309 Dermatitis, unspecified: Secondary | ICD-10-CM | POA: Diagnosis not present

## 2024-06-08 ENCOUNTER — Ambulatory Visit: Admitting: Rehabilitative and Restorative Service Providers"

## 2024-06-08 DIAGNOSIS — J301 Allergic rhinitis due to pollen: Secondary | ICD-10-CM | POA: Diagnosis not present

## 2024-06-08 DIAGNOSIS — J3089 Other allergic rhinitis: Secondary | ICD-10-CM | POA: Diagnosis not present

## 2024-06-08 DIAGNOSIS — J3081 Allergic rhinitis due to animal (cat) (dog) hair and dander: Secondary | ICD-10-CM | POA: Diagnosis not present

## 2024-06-18 ENCOUNTER — Encounter: Admitting: Physical Therapy

## 2024-06-21 ENCOUNTER — Telehealth: Payer: Self-pay

## 2024-06-21 NOTE — Telephone Encounter (Signed)
 [name] sent in a [place] message last week asking for her results and Dr Shila didn't say if Maria Goodwin is to continue her omeprazole . Could you please advise as Dr Shila is away this week. Thank you.

## 2024-07-01 ENCOUNTER — Ambulatory Visit: Payer: Federal, State, Local not specified - PPO | Admitting: Family Medicine

## 2024-07-06 DIAGNOSIS — J301 Allergic rhinitis due to pollen: Secondary | ICD-10-CM | POA: Diagnosis not present

## 2024-07-06 DIAGNOSIS — J3081 Allergic rhinitis due to animal (cat) (dog) hair and dander: Secondary | ICD-10-CM | POA: Diagnosis not present

## 2024-07-06 DIAGNOSIS — J3089 Other allergic rhinitis: Secondary | ICD-10-CM | POA: Diagnosis not present

## 2024-07-09 ENCOUNTER — Ambulatory Visit: Attending: Family Medicine

## 2024-07-09 DIAGNOSIS — M6281 Muscle weakness (generalized): Secondary | ICD-10-CM | POA: Insufficient documentation

## 2024-07-09 DIAGNOSIS — M542 Cervicalgia: Secondary | ICD-10-CM | POA: Diagnosis not present

## 2024-07-09 DIAGNOSIS — R293 Abnormal posture: Secondary | ICD-10-CM | POA: Insufficient documentation

## 2024-07-09 DIAGNOSIS — G8929 Other chronic pain: Secondary | ICD-10-CM | POA: Diagnosis not present

## 2024-07-09 DIAGNOSIS — M25511 Pain in right shoulder: Secondary | ICD-10-CM | POA: Diagnosis not present

## 2024-07-09 DIAGNOSIS — R252 Cramp and spasm: Secondary | ICD-10-CM | POA: Insufficient documentation

## 2024-07-09 NOTE — Therapy (Signed)
 OUTPATIENT PHYSICAL THERAPY TREATMENT NOTE / RE-CERTIFICATION   Patient Name: Maria Goodwin MRN: [id number] DOB:24-Oct-1981, 43 y.o., female Today's Date: 07/09/2024  END OF SESSION:  PT End of Session - 07/09/24 1014     Visit Number 20    Date for PT Re-Evaluation 08/17/24    Authorization Type [employer] (50vl)    PT Start Time 1015    PT Stop Time 1100    PT Time Calculation (min) 45 min    Activity Tolerance Patient tolerated treatment well    Behavior During Therapy WFL for tasks assessed/performed                 Past Medical History:  Diagnosis Date   Allergic reaction 02/23/2020   Itchy mouth with watermelon and cucumber.  Refer to allergist.   Allergic rhinitis    Allergy    Anemia 2021   Anxiety Since childhood   Blood in stool 2010   Patient reports hospitalization for blood in stool, diarrhea and extreme dehydration.  She was hospitalized at this time and a colonoscopy was completed.   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   Frequent headaches    GERD (gastroesophageal reflux disease)    Heart murmur    Hypertension    Iron  deficiency anemia    Irregular menstrual bleeding 2015   Numbness and tingling of upper extremity 02/23/2020   Osteoarthritis of knee 2016   Other allergic rhinitis 03/23/2020   Patellofemoral pain syndrome 04/2015   Tinea corporis    Tonsil stone 2016   Vitamin D  deficiency    Past Surgical History:  Procedure Laterality Date   BREAST BIOPSY Left 08/2016   fibroadenoma   BREAST BIOPSY Left 01/15/2021   BREAST BIOPSY Left 2023   BREAST BIOPSY Left 04/16/2024   US  LT BREAST BX W LOC DEV 1ST LESION IMG BX SPEC US  GUIDE 04/16/2024 [facility]MO[facility]   CESAREAN SECTION  2012   WISDOM TOOTH EXTRACTION     Patient Active Problem List   Diagnosis Date Noted   Narrowing of intervertebral disc space C3-C4 04/23/2024   Bone spur C3-C4 04/23/2024    Cervical radiculopathy due to degenerative joint disease of spine 04/23/2024   Chronic right shoulder pain 04/23/2024   GERD (gastroesophageal reflux disease) 01/14/2024   Encounter for breast cancer screening other than mammogram 01/14/2024   Neck pain 01/14/2024   Iron  deficiency 07/30/2023   Pharyngoesophageal dysphagia 07/30/2023   Vitamin D  deficiency 10/15/2021   Obstructive sleep apnea 05/18/2021   Abnormal mammogram 12/28/2020   Palpitations 06/13/2020   Severe menstrual cramps 06/13/2020   Menorrhagia with regular cycle 06/13/2020   Pollen-food allergy 03/23/2020   Hypertension 02/22/2020   E66.9 (BMI 30-39.9) 02/22/2020    PCP: Catherine Charlies LABOR, DO  REFERRING PROVIDER: Catherine Charlies LABOR, DO  REFERRING DIAG: M50.30 (ICD-10-CM) - DDD (degenerative disc disease), cervical M99.79 (ICD-10-CM) - Narrowing of intervertebral disc space M54.2 (ICD-10-CM) - Neck pain  THERAPY DIAG:  Cervicalgia  Chronic right shoulder pain  Cramp and spasm  Muscle weakness (generalized)  Abnormal posture  Rationale for Evaluation and Treatment: Rehabilitation  ONSET DATE: Chronic  SUBJECTIVE:  SUBJECTIVE STATEMENT: Maria Goodwin returns to PT after being out of town visiting family.  She reports that the Meloxicam  prescribed by MD was very helpful.  She feels that she is doing well and has all of her exercises to keep doing at home.  Would like to be discharged.    Hand dominance: Right  PERTINENT HISTORY:  Anemia; anxiety; HTN; OA right knee  PAIN: 07/09/24/2025 Are you having pain? Yes: NPRS scale: 3/10 Pain location: C- spine bilateral Lt> Rt  Pain description: tight; uncomfortable Aggravating factors: driving in the car Relieving factors: stretching  PRECAUTIONS: None  RED  FLAGS: None     WEIGHT BEARING RESTRICTIONS: No  FALLS:  Has patient fallen in last 6 months? Yes. Number of falls fell walking in the woods. Patient verbalized she does not feel her balance is an issue.  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  DIAGNOSTIC FINDINGS:  IMPRESSION: Multilevel degenerative disc disease with severe narrowing of the C4-C5 disc space.  Rt Shoulder X ray 6/19 IMPRESSION: Mild acromioclavicular and minimal glenohumeral osteoarthritis.  Rt Shoulder MRI results not read yet  PATIENT SURVEYS:  Eval:  NDI 8/50 16% 02/19/2024:  Neck Disability Index score: 5 / 50 = 10.0 % 04/01/2024:  Neck Disability Index score: 6 / 50 = 12.0 % 05/11/2024 NDI : 9/50 18% QuickDASH: 36.4/100 36.4% 07/09/2024: NDI: 2/50 4%  SENSATION: Occasional numbness and tingling in Rt arm  POSTURE: rounded shoulders and forward head  PALPATION: Increased muscle spasms of bilateral upper trap, suboccipitals, and cervical paraspinals  Decreased upper T spine mobility joint mobs Grade III   CERVICAL ROM:   Active ROM A/ROM (deg) eval A/ROM (Deg) 04/01/24 AROM DC  Flexion 60 65 WNL  Extension 30 50 WNL  Right lateral flexion 32 50 WNL  Left lateral flexion 43 50 WNL  Right rotation 80 80 WNL  Left rotation 85 90 WNL   (Blank rows = not tested)  UPPER EXTREMITY ROM: WFL bilateral    UPPER EXTREMITY MMT:  MMT Right eval Right  DC Left eval Left DC  Shoulder flexion 4 4+ 4+ 5  Shoulder extension       Shoulder abduction 4+ 4+ 4+ 4+  Shoulder adduction      Shoulder extension      Shoulder internal rotation  5  5  Shoulder external rotation  4  4+  Middle trapezius      Lower trapezius      Elbow flexion 5 5 5 5   Elbow extension 5 5 5 5   Wrist flexion      Wrist extension      Wrist ulnar deviation      Wrist radial deviation      Wrist pronation      Wrist supination      Grip strength       (Blank rows = not tested)    FUNCTIONAL TESTS:  Eval:  5 times sit to stand: 11.92 sec no hands  TREATMENT DATE:  07/09/24: UBE x 6 min 3/3 (PT present to discuss  progress, status with regard to DC vs continue PT, posture and speed on UBE for max benefit) 3 way scapular stabilization with yellow loop x 10 each side Reviewed all of HEP Re-cert/DC assessment completed  06/03/2024: Manual:  soft tissue mobilization to cervical and thoracic paraspinals to assess for trigger points and perform manual trigger point release, as needed. Cervical melt method (flexion/extension) x 20 Hooklying on foam roll/bolster with cuing for relaxation x1 min Hooklying on foam roll/bolster snow angels x10 (with cuing for technique) Hooklying on foam roll/bolster performing alt shoulder flexion (swimming) x10 Supine serratus punch with 4# dumbbell 2x10 Counter push ups 2x10 Standing rows with blue tband 2x10 Shoulder scaption on blue physioball 2x10 (with cuing for technique and relaxing upper traps) Supine deep cervical flexion 3x5 Cervical Mechanical traction 15 mins, 18 lbs force   05/11/2024: Discussion of goals, MD appointments, and reviewed HEP Quick DASH: 36.4/100 36.4% NDI :9/50 18% Cervical melt method (flexion/extension) x 20 Spine along foam roll : shoulder abduction, shoulder ER, D2 flexion with blue T x 10  Quadruped scap push up x 10 Seated scaption & flexion 2# DB 2 x 10 bilateral  Push up at counter 2 x 8 Cervical Mechanical traction 15 mins, 18 lbs force   04/26/2024: UBE level  2.0 x 6 min (3/3) with PT present to discuss status 3 way scapular stabilization with yellow loop x 10 each side 4D ball rolls with 2 lb (light blue) plyo ball x 20 each direction on each UE Cervical melt method (flexion/extension) x 20 Prone shoulder extension with 2 lbs 2 x 10 both Prone shoulder row with 2 lbs 2 x 10 both Prone shoulder horizontal abduction with 2 lbs 2 x 10 both Side lying ER with 2 lbs 2 x 10 both Supine serratus punch with 2 lbs 2 x 10 Foam roll along spine D2 flexion, ER  and  shoulder abduction with green TB x 10 each  Cervical Mechanical traction 15 mins, increased to 18 lbs force    PATIENT EDUCATION:  Education details: POC; DN handout & benefits; HEP; explanation of Xray results Person educated: Patient Education method: Explanation, Demonstration, and Handouts Education comprehension: verbalized understanding, returned demonstration, and needs further education  HOME EXERCISE PROGRAM: Access Code: [email or web address] URL: [email or web address] Date: 07/09/2024 Prepared by: Delon Haddock  Exercises - Seated Upper Trapezius Stretch  - 1 x daily - 7 x weekly - 2 sets - 20-30 hold - Seated Levator Scapulae Stretch  - 1 x daily - 7 x weekly - 1 sets - 2 reps - 20-30 hold - Seated Scapular Retraction  - 1 x daily - 7 x weekly - 2 sets - 10 reps - Seated Cervical Retraction  - 1 x daily - 7 x weekly - 2 sets - 10 reps - Doorway Pec Stretch at 90 Degrees Abduction  - 1 x daily - 7 x weekly - 1 sets - 2 reps - 20-30 hold - Standing Low Shoulder Row with Anchored Resistance  - 1 x daily - 7 x weekly - 2 sets - 10 reps - Shoulder Extension with Resistance Hands Down  - 1 x daily - 7 x weekly - 2 sets - 10 reps - Ulnar Nerve Glide- Full Arm  - 1 x daily - 7 x weekly - 2 sets - 10 reps - Cervical Retraction Prone on Elbows  - 1 x daily - 7 x weekly - 2 sets - 10 reps - Seated Assisted Cervical Rotation with Towel  -  1 x daily - 7 x weekly - 1-2 sets - 10  reps - Upper Cervical Extension SNAG with Strap  - 1 x daily - 7 x weekly - 1-2 sets - 10 reps - Seated Upper Extremity Nerve Glide  - 1 x daily - 7 x weekly - 1-2 sets - 10 reps - Prone Shoulder Extension - Single Arm  - 2 x daily - 7 x weekly - 2 sets - 10 reps - Prone Shoulder Row  - 2 x daily - 7 x weekly - 2 sets - 10 reps - Prone Single Arm Shoulder Horizontal Abduction with Scapular Retraction and Palm Down  - 2 x daily - 7 x weekly - 2 sets - 10 reps - Sidelying Shoulder External Rotation  - 2 x daily - 7 x weekly - 2 sets - 10 reps - Single Arm Serratus Punches in Supine with Dumbbell  - 2 x daily - 7 x weekly - 2 sets - 10 reps - Standing Shoulder Flexion to 90 Degrees with Dumbbells  - 1 x daily - 7 x weekly - 2 sets - 10 reps - Scaption with Dumbbells  - 1 x daily - 7 x weekly - 2 sets - 10 reps   ASSESSMENT:  CLINICAL IMPRESSION: Maria Goodwin has improved significantly since her last visit.  She has improved right shoulder strength and less pain in the neck and shoulder.  She does admit that she gets pain with throwing ball with her dogs.  Suggested we discontinue the overhead band exercises and continue all other shoulder and postural exercises.  She also need correction on scaption.  She was doing deltoid raise.  Explained to patient that this could also encourage impingement if the rotator cuff is not strong enough to override the upward pull of the deltoid. Overall, she is doing well.  Objective findings are all improved.  She is well motivated and compliant.  She should continue to do well.  We will DC at this time.  Encouraged patient to call MD if any set backs or exacerbation of symptoms.    OBJECTIVE IMPAIRMENTS: decreased ROM, decreased strength, increased muscle spasms, impaired flexibility, postural dysfunction, and pain.    GOALS: Goals reviewed with patient? Yes  SHORT TERM GOALS: Target date: 02/17/2024  Patient will be independent with initial HEP. Baseline:  Goal  status: Met   2.  Patient will report > or = to 30% improvement in symptoms since starting PT. Baseline:  Goal status: Met on 02/19/2024  3.  Patient will demonstrate correct seated posture. Baseline:  Goal status: Met on 02/23/24   LONG TERM GOALS: Target date: 06/22/2024  Patient will demonstrate independence in advanced HEP. Baseline:  Goal status: MET 07/09/24  2.  Patient will report > or = to 70% improvement in symptoms since starting PT. Baseline:  Goal status: MET 07/09/24  3.  Patient will verbalize and demonstrate self-care strategies to manage pain including tissue mobility practices and change of position. Baseline:  Goal status: MET 07/09/24   4.  Patient will be able to drive and look in her blind spot with < or = to 2/10 neck pain. Baseline:  Goal status: met 05/11/2024 (only tightness)  5.  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 Baseline:  Goal status: MET 07/09/24 (still has some mild right shoulder pain)  6.  Patient will verbalize 50% improved in pins/needles sensation in Rt arm.  Baseline:  Goal status: MET 07/09/24  reports no symptoms    PLAN:  PT FREQUENCY: 1-2x/week  PT DURATION: 6 weeks  PLANNED INTERVENTIONS: 97164- PT Re-evaluation, 97110-Therapeutic exercises, 97530- Therapeutic activity, 97112- Neuromuscular re-education, 97535- Self Care, [id number]- Manual therapy, [phone]- Canalith repositioning, [id number]- Aquatic Therapy, [id number]- Electrical stimulation (unattended), [id number]- Electrical stimulation (manual), [id number]- Vasopneumatic device, [id number]- Ultrasound, [id number]- Traction (mechanical), [id number]- Ionotophoresis 4mg /ml Dexamethasone, Patient/Family education, Taping, Dry Needling, Joint mobilization, Joint manipulation, Spinal manipulation, Spinal mobilization, Vestibular training, Cryotherapy, and Moist heat  PLAN FOR NEXT SESSION:  DC today  Roma Bondar B. Lewanda Perea, PT 07/09/24 11:04 AM [facility] [address] [place], [place] [address] Phone # [phone] Fax [phone]

## 2024-07-11 ENCOUNTER — Other Ambulatory Visit: Payer: Self-pay | Admitting: Family Medicine

## 2024-07-13 DIAGNOSIS — J3089 Other allergic rhinitis: Secondary | ICD-10-CM | POA: Diagnosis not present

## 2024-07-13 DIAGNOSIS — J3081 Allergic rhinitis due to animal (cat) (dog) hair and dander: Secondary | ICD-10-CM | POA: Diagnosis not present

## 2024-07-13 DIAGNOSIS — J301 Allergic rhinitis due to pollen: Secondary | ICD-10-CM | POA: Diagnosis not present

## 2024-07-20 DIAGNOSIS — J3089 Other allergic rhinitis: Secondary | ICD-10-CM | POA: Diagnosis not present

## 2024-07-20 DIAGNOSIS — J3081 Allergic rhinitis due to animal (cat) (dog) hair and dander: Secondary | ICD-10-CM | POA: Diagnosis not present

## 2024-07-20 DIAGNOSIS — J301 Allergic rhinitis due to pollen: Secondary | ICD-10-CM | POA: Diagnosis not present

## 2024-07-21 ENCOUNTER — Encounter: Payer: Self-pay | Admitting: Family Medicine

## 2024-07-21 ENCOUNTER — Ambulatory Visit: Admitting: Family Medicine

## 2024-07-21 VITALS — BP 116/76 | HR 72 | Temp 98.2°F | Wt 172.8 lb

## 2024-07-21 DIAGNOSIS — N946 Dysmenorrhea, unspecified: Secondary | ICD-10-CM

## 2024-07-21 DIAGNOSIS — I10 Essential (primary) hypertension: Secondary | ICD-10-CM | POA: Diagnosis not present

## 2024-07-21 DIAGNOSIS — Z23 Encounter for immunization: Secondary | ICD-10-CM

## 2024-07-21 DIAGNOSIS — K219 Gastro-esophageal reflux disease without esophagitis: Secondary | ICD-10-CM | POA: Diagnosis not present

## 2024-07-21 DIAGNOSIS — E669 Obesity, unspecified: Secondary | ICD-10-CM | POA: Diagnosis not present

## 2024-07-21 MED ORDER — OMEPRAZOLE 40 MG PO CPDR: 40.0000 mg | DELAYED_RELEASE_CAPSULE | Freq: Every day | ORAL | 1 refills | Status: DC

## 2024-07-21 MED ORDER — AMLODIPINE BESYLATE 10 MG PO TABS: 10.0000 mg | ORAL_TABLET | Freq: Every day | ORAL | 1 refills | Status: DC

## 2024-07-21 MED ORDER — NAPROXEN 500 MG PO TABS: 500.0000 mg | ORAL_TABLET | Freq: Two times a day (BID) | ORAL | 4 refills | Status: AC | PRN

## 2024-07-21 NOTE — Patient Instructions (Addendum)

## 2024-07-21 NOTE — Progress Notes (Signed)
 Patient ID: Maria Goodwin, female  DOB: June 13, 1981, 43 y.o.   MRN: [id number] Patient Care Team    Relationship Specialty Notifications Start End  Catherine Charlies LABOR, DO PCP - General Family Medicine  02/22/20   Ginette Shasta NOVAK, NP Nurse Practitioner Obstetrics and Gynecology  06/04/23   Cheryn Nickels, MD Referring Physician Allergy and Immunology  12/11/23   Nandigam, Kavitha V, MD Consulting Physician Gastroenterology  07/21/24     Chief Complaint  Patient presents with   Hypertension    Subjective: Maria Goodwin is a 43 y.o.  Female  present for Chronic Conditions/illness Management All past medical history, surgical history, allergies, family history, immunizations, medications and social history were updated in the electronic medical record today. All recent labs, ED visits and hospitalizations within the last year were reviewed.  Hypertension/obesity/palpitations:  Pt reports compliance with amlodipine  10 mg daily.  Patient denies chest pain, shortness of breath, dizziness or lower extremity edema.   Pt is not taking a daily baby ASA. Pt is not prescribed a prescribed statin. RF: Hypertension, obesity, family history of hypertension, hyperlipidemia and stroke      07/21/2024    9:58 AM 07/30/2023    8:21 AM 12/20/2022    3:26 PM 10/15/2021    2:37 PM 09/14/2020    8:01 AM  Depression screen PHQ 2/9  Decreased Interest 0 0 0 0 0  Down, Depressed, Hopeless 0 0 0 0 0  PHQ - 2 Score 0 0 0 0 0  Altered sleeping 0  0    Tired, decreased energy 1      Change in appetite 0      Feeling bad or failure about yourself  0      Trouble concentrating 1      Moving slowly or fidgety/restless 0      Suicidal thoughts 0      PHQ-9 Score 2  0    Difficult doing work/chores Not difficult at all          07/21/2024    9:58 AM  GAD 7 : Generalized Anxiety Score  Nervous, Anxious, on Edge 0  Control/stop worrying 0  Worry too much - different things 0  Trouble relaxing 0  Restless 0   Easily annoyed or irritable 0  Afraid - awful might happen 0  Total GAD 7 Score 0  Anxiety Difficulty Not difficult at all     Immunization History  Administered Date(s) Administered   Hepatitis A 08/15/2016, 02/11/2017   Hepatitis B 01/31/1987   Influenza,inj,Quad PF,6+ Mos 07/21/2021, 07/05/2022, 11/25/2022, 12/03/2023   Influenza-Unspecified 09/07/2019, 08/02/2020, 07/25/2023   PFIZER Comirnaty(Gray Top)Covid-19 Tri-Sucrose Vaccine 08/13/2022   PFIZER(Purple Top)SARS-COV-2 Vaccination 02/08/2020, 02/29/2020, 09/15/2020, 11/25/2022   Pfizer Covid-19 Vaccine Bivalent Booster 39yrs & up 08/09/2021   Tdap 08/15/2016     Past Medical History:  Diagnosis Date   Allergic reaction 02/23/2020   Itchy mouth with watermelon and cucumber.  Refer to allergist.   Allergic rhinitis    Allergy    Anemia 2021   Anxiety Since childhood   Blood in stool 2010   Patient reports hospitalization for blood in stool, diarrhea and extreme dehydration.  She was hospitalized at this time and a colonoscopy was completed.   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   Frequent headaches    GERD (gastroesophageal reflux disease)    Heart murmur  Hypertension    Iron  deficiency anemia    Irregular menstrual bleeding 2015   Numbness and tingling of upper extremity 02/23/2020   Osteoarthritis of knee 2016   Other allergic rhinitis 03/23/2020   Patellofemoral pain syndrome 04/2015   Tinea corporis    Tonsil stone 2016   Vitamin D  deficiency    No Known Allergies Past Surgical History:  Procedure Laterality Date   BREAST BIOPSY Left 08/2016   fibroadenoma   BREAST BIOPSY Left 01/15/2021   BREAST BIOPSY Left 2023   BREAST BIOPSY Left 04/16/2024   US  LT BREAST BX W LOC DEV 1ST LESION IMG BX SPEC US  GUIDE 04/16/2024 [facility]MO[facility]   CESAREAN SECTION  2012   WISDOM TOOTH EXTRACTION     Family History  Problem  Relation Age of Onset   Arthritis Mother    Hyperlipidemia Mother    Hypertension Mother    Learning disabilities Mother    Allergic rhinitis Mother    ALS Mother    Alcohol abuse Father    Depression Father    Vision loss Father    Asthma Brother    Hyperlipidemia Brother    Hypertension Brother    Allergic rhinitis Brother    Asthma Brother    Hyperlipidemia Brother    Hypertension Brother    Allergic rhinitis Brother    Bladder Cancer Maternal Grandmother    Early death Maternal Grandmother    Skin cancer Maternal Grandfather    Early death Maternal Grandfather    Stroke Maternal Grandfather    Hypertension Maternal Grandfather    Skin cancer Paternal Grandmother    Skin cancer Paternal Grandfather    Allergic rhinitis Son    Eczema Son    Urticaria Son    Breast cancer Neg Hx    Colon cancer Neg Hx    Colon polyps Neg Hx    Esophageal cancer Neg Hx    Rectal cancer Neg Hx    Stomach cancer Neg Hx    Social History   Social History Narrative   Marital status/children/pets: Married, 1 child.   Education/employment: High school graduate, stay-at-home mom   Safety:      -Wears a bicycle helmet riding a bike: Yes     -smoke alarm in the home:Yes     - wears seatbelt: Yes     - Feels safe in their relationships: Yes    Allergies as of 07/21/2024   No Known Allergies      Medication List        Accurate as of July 21, 2024 10:15 AM. If you have any questions, ask your nurse or doctor.          STOP taking these medications    meloxicam  15 MG tablet Commonly known as: MOBIC  Stopped by: Charlies Bellini       TAKE these medications    albuterol 108 (90 Base) MCG/ACT inhaler Commonly known as: VENTOLIN HFA INHALE 2 PUFFS EVERY 4-6 HOURS AS NEEDED COUGH/WHEEZE for 30   ALLEGRA-D 24 HOUR PO Take by mouth.   amLODipine  10 MG tablet Commonly known as: NORVASC  Take 1 tablet (10 mg total) by mouth daily.   EPINEPHrine 0.3 mg/0.3 mL Soaj  injection Commonly known as: EPI-PEN See admin instructions.   fluticasone  50 MCG/ACT nasal spray Commonly known as: FLONASE  Place 2 sprays into both nostrils in the morning and at bedtime.   MULTI COMPLETE PO Take by mouth.   naproxen  500 MG tablet Commonly known as:  Naprosyn  Take 1 tablet (500 mg total) by mouth 2 (two) times daily as needed for mild pain (pain score 1-3).   omeprazole  40 MG capsule Commonly known as: PRILOSEC Take 1 capsule (40 mg total) by mouth daily.        All past medical history, surgical history, allergies, family history, immunizations andmedications were updated in the EMR today and reviewed under the history and medication portions of their EMR.       ROS: 14 pt review of systems performed and negative (unless mentioned in an HPI)  Objective: BP 116/76   Pulse 72   Temp 98.2 F (36.8 C)   Wt 172 lb 12.8 oz (78.4 kg)   LMP 07/19/2024   SpO2 98%   BMI 32.65 kg/m  Physical Exam Vitals and nursing note reviewed.  Constitutional:      General: She is not in acute distress.    Appearance: Normal appearance. She is not ill-appearing, toxic-appearing or diaphoretic.  HENT:     Head: Normocephalic and atraumatic.  Eyes:     General: No scleral icterus.       Right eye: No discharge.        Left eye: No discharge.     Extraocular Movements: Extraocular movements intact.     Conjunctiva/sclera: Conjunctivae normal.     Pupils: Pupils are equal, round, and reactive to light.  Cardiovascular:     Rate and Rhythm: Normal rate and regular rhythm.     Heart sounds: No murmur heard. Pulmonary:     Effort: Pulmonary effort is normal. No respiratory distress.     Breath sounds: Normal breath sounds. No wheezing, rhonchi or rales.  Musculoskeletal:     Right lower leg: No edema.     Left lower leg: No edema.  Skin:    General: Skin is warm.     Findings: No rash.  Neurological:     Mental Status: She is alert and oriented to person, place,  and time. Mental status is at baseline.     Motor: No weakness.     Gait: Gait normal.  Psychiatric:        Mood and Affect: Mood normal.        Behavior: Behavior normal.        Thought Content: Thought content normal.        Judgment: Judgment normal.     No results found.  Assessment/plan: Viktoriya Glaspy is a 43 y.o. female present for cpe with Chronic Conditions/illness Management Primary hypertension/E66.9 (BMI 30-39.9)/Palpitations Stable Low sodium diet Routine exercise Continue amlodipine  10 mg QD  Labs due next visit  Vitamin D  deficiency: Continue itamin D 1000-2000 mcg daily.   Severe menstrual cramps/Menorrhagia with regular cycle Stable continue naproxen  a few days prior to menstrual cycle and through bleeding pattern.  GERD Has not been evaluated by ENT and increased reflux likely cause of her difficulty swallowing. Continue omeprazole  40 mg daily Referral to gastroenterology has been placed to have further workup with EGD   Return in about 6 months (around 01/14/2025) for cpe (20 min), Routine chronic condition follow-up.   No orders of the defined types were placed in this encounter.  Meds ordered this encounter  Medications   amLODipine  (NORVASC ) 10 MG tablet    Sig: Take 1 tablet (10 mg total) by mouth daily.    Dispense:  90 tablet    Refill:  1   omeprazole  (PRILOSEC) 40 MG capsule    Sig: Take 1 capsule (  40 mg total) by mouth daily.    Dispense:  90 capsule    Refill:  1   naproxen  (NAPROSYN ) 500 MG tablet    Sig: Take 1 tablet (500 mg total) by mouth 2 (two) times daily as needed for mild pain (pain score 1-3).    Dispense:  60 tablet    Refill:  4   Referral Orders  No referral(s) requested today    Electronically signed by: Charlies Bellini, DO [facility]

## 2024-07-27 DIAGNOSIS — J3081 Allergic rhinitis due to animal (cat) (dog) hair and dander: Secondary | ICD-10-CM | POA: Diagnosis not present

## 2024-07-27 DIAGNOSIS — J3089 Other allergic rhinitis: Secondary | ICD-10-CM | POA: Diagnosis not present

## 2024-07-27 DIAGNOSIS — J301 Allergic rhinitis due to pollen: Secondary | ICD-10-CM | POA: Diagnosis not present

## 2024-08-10 DIAGNOSIS — J3089 Other allergic rhinitis: Secondary | ICD-10-CM | POA: Diagnosis not present

## 2024-08-10 DIAGNOSIS — J3081 Allergic rhinitis due to animal (cat) (dog) hair and dander: Secondary | ICD-10-CM | POA: Diagnosis not present

## 2024-08-10 DIAGNOSIS — J301 Allergic rhinitis due to pollen: Secondary | ICD-10-CM | POA: Diagnosis not present

## 2024-08-30 NOTE — Telephone Encounter (Signed)
 No further action needed at this time.

## 2024-08-31 DIAGNOSIS — J301 Allergic rhinitis due to pollen: Secondary | ICD-10-CM | POA: Diagnosis not present

## 2024-08-31 DIAGNOSIS — J3089 Other allergic rhinitis: Secondary | ICD-10-CM | POA: Diagnosis not present

## 2024-08-31 DIAGNOSIS — J3081 Allergic rhinitis due to animal (cat) (dog) hair and dander: Secondary | ICD-10-CM | POA: Diagnosis not present

## 2024-09-24 ENCOUNTER — Ambulatory Visit: Admitting: Family Medicine

## 2024-09-24 ENCOUNTER — Encounter: Payer: Self-pay | Admitting: Family Medicine

## 2024-09-24 VITALS — BP 116/76 | HR 74 | Temp 98.0°F | Wt 174.0 lb

## 2024-09-24 DIAGNOSIS — N951 Menopausal and female climacteric states: Secondary | ICD-10-CM | POA: Diagnosis not present

## 2024-09-24 DIAGNOSIS — N912 Amenorrhea, unspecified: Secondary | ICD-10-CM

## 2024-09-24 DIAGNOSIS — R5383 Other fatigue: Secondary | ICD-10-CM

## 2024-09-24 DIAGNOSIS — R4189 Other symptoms and signs involving cognitive functions and awareness: Secondary | ICD-10-CM

## 2024-09-24 LAB — IBC + FERRITIN
Ferritin: 8.2 ng/mL — ABNORMAL LOW (ref 10.0–291.0)
Iron: 43 ug/dL (ref 42–145)
Saturation Ratios: 11.9 % — ABNORMAL LOW (ref 20.0–50.0)
TIBC: 362.6 ug/dL (ref 250.0–450.0)
Transferrin: 259 mg/dL (ref 212.0–360.0)

## 2024-09-24 LAB — VITAMIN D 25 HYDROXY (VIT D DEFICIENCY, FRACTURES): VITD: 39.65 ng/mL (ref 30.00–100.00)

## 2024-09-24 LAB — B12 AND FOLATE PANEL
Folate: 21.6 ng/mL (ref >5.9)
Vitamin B-12: 558 pg/mL (ref 211–911)

## 2024-09-24 LAB — FOLLICLE STIMULATING HORMONE: FSH: 5.1 m[IU]/mL

## 2024-09-24 LAB — LUTEINIZING HORMONE: LH: 12.35 m[IU]/mL

## 2024-09-24 LAB — TSH: TSH: 0.99 u[IU]/mL (ref 0.35–5.50)

## 2024-09-24 MED ORDER — VENLAFAXINE HCL ER 75 MG PO CP24: 75.0000 mg | ORAL_CAPSULE | Freq: Every day | ORAL | 1 refills | Status: AC

## 2024-09-24 MED ORDER — VENLAFAXINE HCL ER 37.5 MG PO CP24: 37.5000 mg | ORAL_CAPSULE | Freq: Every day | ORAL | 0 refills | Status: DC

## 2024-09-24 NOTE — Progress Notes (Signed)
 Maria Goodwin , [birth year], 43 y.o., female MRN: [id number] Patient Care Team    Relationship Specialty Notifications Start End  Catherine Charlies LABOR, DO PCP - General Family Medicine  02/22/20   Ginette Shasta NOVAK, NP Nurse Practitioner Obstetrics and Gynecology  06/04/23   Cheryn Nickels, MD Referring Physician Allergy and Immunology  12/11/23   Nandigam, Kavitha V, MD Consulting Physician Gastroenterology  07/21/24     Chief Complaint  Patient presents with   Premenstrual Syndrome    Irregular cycles, brain fog,  fatigue Influenza vaccine- pharmacy     Subjective: Maria Goodwin is a 43 y.o. Pt presents for an OV with complaints of brain fog and feeling generally anxious and not well.  She reports she is usually happy and energetic and is not and she is only feeling good for about 1 week, after her menstrual cycle.  Menstrual cycles have become more irregular over the last 12 months.  She states she started her menstrual cycle at age 25 and they were always around 28 days and heavy.  Over the last year her menstrual cycles occur about every 5 to 7 weeks. Patient reports she is sleeping well.  She currently does not have any hot flashes.  She had a couple night sweats a few months ago, but nothing since.  There may be some mild vaginal dryness, but intermittently per patient.  She has a history of vitamin D  deficiency.  Patient's last menstrual period was 08/04/2024.      07/21/2024    9:58 AM 07/30/2023    8:21 AM 12/20/2022    3:26 PM 10/15/2021    2:37 PM 09/14/2020    8:01 AM  Depression screen PHQ 2/9  Decreased Interest 0 0 0 0 0  Down, Depressed, Hopeless 0 0 0 0 0  PHQ - 2 Score 0 0 0 0 0  Altered sleeping 0  0    Tired, decreased energy 1      Change in appetite 0      Feeling bad or failure about yourself  0      Trouble concentrating 1      Moving slowly or fidgety/restless 0      Suicidal thoughts 0      PHQ-9 Score 2   0     Difficult doing work/chores Not difficult at  all         Data saved with a previous flowsheet row definition    No Known Allergies Social History   Social History Narrative   Marital status/children/pets: Married, 1 child.   Education/employment: High school graduate, stay-at-home mom   Safety:      -Wears a bicycle helmet riding a bike: Yes     -smoke alarm in the home:Yes     - wears seatbelt: Yes     - Feels safe in their relationships: Yes   Past Medical History:  Diagnosis Date   Allergic reaction 02/23/2020   Itchy mouth with watermelon and cucumber.  Refer to allergist.   Allergic rhinitis    Allergy    Anemia 2021   Anxiety Since childhood   Blood in stool 2010   Patient reports hospitalization for blood in stool, diarrhea and extreme dehydration.  She was hospitalized at this time and a colonoscopy was completed.   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   Frequent headaches    GERD (  gastroesophageal reflux disease)    Heart murmur    Hypertension    Iron  deficiency anemia    Irregular menstrual bleeding 2015   Numbness and tingling of upper extremity 02/23/2020   Osteoarthritis of knee 2016   Other allergic rhinitis 03/23/2020   Patellofemoral pain syndrome 04/2015   Tinea corporis    Tonsil stone 2016   Vitamin D  deficiency    Past Surgical History:  Procedure Laterality Date   BREAST BIOPSY Left 08/2016   fibroadenoma   BREAST BIOPSY Left 01/15/2021   BREAST BIOPSY Left 2023   BREAST BIOPSY Left 04/16/2024   US  LT BREAST BX W LOC DEV 1ST LESION IMG BX SPEC US  GUIDE 04/16/2024 [facility] MAMMOGRAPHY   CESAREAN SECTION  2012   WISDOM TOOTH EXTRACTION     Family History  Problem Relation Age of Onset   Arthritis Mother    Hyperlipidemia Mother    Hypertension Mother    Learning disabilities Mother    Allergic rhinitis Mother    ALS Mother    Alcohol abuse Father    Depression Father    Vision loss Father     Asthma Brother    Hyperlipidemia Brother    Hypertension Brother    Allergic rhinitis Brother    Asthma Brother    Hyperlipidemia Brother    Hypertension Brother    Allergic rhinitis Brother    Bladder Cancer Maternal Grandmother    Early death Maternal Grandmother    Skin cancer Maternal Grandfather    Early death Maternal Grandfather    Stroke Maternal Grandfather    Hypertension Maternal Grandfather    Skin cancer Paternal Grandmother    Skin cancer Paternal Grandfather    Allergic rhinitis Son    Eczema Son    Urticaria Son    Breast cancer Neg Hx    Colon cancer Neg Hx    Colon polyps Neg Hx    Esophageal cancer Neg Hx    Rectal cancer Neg Hx    Stomach cancer Neg Hx    Allergies as of 09/24/2024   No Known Allergies      Medication List        Accurate as of September 24, 2024 10:45 AM. If you have any questions, ask your nurse or doctor.          albuterol 108 (90 Base) MCG/ACT inhaler Commonly known as: VENTOLIN HFA INHALE 2 PUFFS EVERY 4-6 HOURS AS NEEDED COUGH/WHEEZE for 30   ALLEGRA-D 24 HOUR PO Take by mouth.   amLODipine  10 MG tablet Commonly known as: NORVASC  Take 1 tablet (10 mg total) by mouth daily.   EPINEPHrine 0.3 mg/0.3 mL Soaj injection Commonly known as: EPI-PEN See admin instructions.   fluticasone  50 MCG/ACT nasal spray Commonly known as: FLONASE  Place 2 sprays into both nostrils in the morning and at bedtime.   MULTI COMPLETE PO Take by mouth.   naproxen  500 MG tablet Commonly known as: Naprosyn  Take 1 tablet (500 mg total) by mouth 2 (two) times daily as needed for mild pain (pain score 1-3).   omeprazole  40 MG capsule Commonly known as: PRILOSEC Take 1 capsule (40 mg total) by mouth daily.   venlafaxine XR 37.5 MG 24 hr capsule Commonly known as: Effexor XR Take 1 capsule (37.5 mg total) by mouth daily with breakfast. Started by: Charlies Bellini   venlafaxine XR 75 MG 24 hr capsule Commonly known as: Effexor XR Take 1  capsule (75 mg total) by mouth daily  with breakfast. Start taking on: September 30, 2024 Started by: Charlies Bellini        All past medical history, surgical history, allergies, family history, immunizations andmedications were updated in the EMR today and reviewed under the history and medication portions of their EMR.     ROS Negative, with the exception of above mentioned in HPI   Objective:  BP 116/76   Pulse 74   Temp 98 F (36.7 C)   Wt 174 lb (78.9 kg)   LMP 08/04/2024   SpO2 99%   BMI 32.88 kg/m  Body mass index is 32.88 kg/m.  Physical Exam Vitals and nursing note reviewed.  Constitutional:      General: She is not in acute distress.    Appearance: Normal appearance. She is normal weight. She is not ill-appearing or toxic-appearing.  HENT:     Head: Normocephalic and atraumatic.  Eyes:     General: No scleral icterus.       Right eye: No discharge.        Left eye: No discharge.     Extraocular Movements: Extraocular movements intact.     Conjunctiva/sclera: Conjunctivae normal.     Pupils: Pupils are equal, round, and reactive to light.  Skin:    Findings: No rash.  Neurological:     Mental Status: She is alert and oriented to person, place, and time. Mental status is at baseline.     Motor: No weakness.     Coordination: Coordination normal.     Gait: Gait normal.  Psychiatric:        Mood and Affect: Mood normal.        Behavior: Behavior normal.        Thought Content: Thought content normal.        Judgment: Judgment normal.      No results found. No results found. No results found for this or any previous visit (from the past 24 hours).  Assessment/Plan: Alieyah Spader is a 43 y.o. female present for OV for  Perimenopausal symptom (Primary)/amenorrhea We discussed options including hormones versus SSRI/SNRI as treatment options.  Her main complaint is brain fog and mood related symptoms.  Her cycles are still occurring, irregularly about  every 6 weeks. Recommended we first start with an SSRI/SNRI to see if she sees improvement in her mood and brain fog and workup for other causes prior to considering hormone replacement.  If other options do not help her feel better, then we can always revisit HRT.  Patient is in agreement. - Estradiol - LH - FSH - Progesterone - TSH - Vitamin D  (25 hydroxy) - B12 and Folate Panel - IBC + Ferritin   Brain fog/fatigue Possibly perimenopausal related versus depression/anxiety versus vitamin deficiency. - TSH - Vitamin D  (25 hydroxy) - B12 and Folate Panel -Discussed options today start with Effexor taper and consider HRT at a later date if desired.  Reviewed expectations re: course of current medical issues. Discussed self-management of symptoms. Outlined signs and symptoms indicating need for more acute intervention. Patient verbalized understanding and all questions were answered. Patient received an After-Visit Summary.    Return in about 6 weeks (around 11/05/2024).  Orders Placed This Encounter  Procedures   Estradiol   LH   FSH   Progesterone   TSH   Vitamin D  (25 hydroxy)   B12 and Folate Panel   IBC + Ferritin   Meds ordered this encounter  Medications   venlafaxine XR (EFFEXOR XR) 37.5  MG 24 hr capsule    Sig: Take 1 capsule (37.5 mg total) by mouth daily with breakfast.    Dispense:  7 capsule    Refill:  0   venlafaxine XR (EFFEXOR XR) 75 MG 24 hr capsule    Sig: Take 1 capsule (75 mg total) by mouth daily with breakfast.    Dispense:  90 capsule    Refill:  1   Referral Orders  No referral(s) requested today     Note is dictated utilizing voice recognition software. Although note has been proof read prior to signing, occasional typographical errors still can be missed. If any questions arise, please do not hesitate to call for verification.   electronically signed by:  Charlies Bellini, DO  [facility] [place]

## 2024-09-24 NOTE — Patient Instructions (Signed)

## 2024-09-25 LAB — ESTRADIOL: Estradiol: 272 pg/mL

## 2024-09-25 LAB — PROGESTERONE: Progesterone: 3.7 ng/mL

## 2024-09-27 ENCOUNTER — Ambulatory Visit: Payer: Self-pay | Admitting: Family Medicine

## 2024-09-29 ENCOUNTER — Ambulatory Visit: Payer: Self-pay | Admitting: Gastroenterology

## 2024-09-29 DIAGNOSIS — J301 Allergic rhinitis due to pollen: Secondary | ICD-10-CM | POA: Diagnosis not present

## 2024-09-29 DIAGNOSIS — J3089 Other allergic rhinitis: Secondary | ICD-10-CM | POA: Diagnosis not present

## 2024-09-29 DIAGNOSIS — J3081 Allergic rhinitis due to animal (cat) (dog) hair and dander: Secondary | ICD-10-CM | POA: Diagnosis not present

## 2024-10-21 DIAGNOSIS — J301 Allergic rhinitis due to pollen: Secondary | ICD-10-CM | POA: Diagnosis not present

## 2024-10-21 DIAGNOSIS — J3089 Other allergic rhinitis: Secondary | ICD-10-CM | POA: Diagnosis not present

## 2024-10-21 DIAGNOSIS — J3081 Allergic rhinitis due to animal (cat) (dog) hair and dander: Secondary | ICD-10-CM | POA: Diagnosis not present

## 2024-11-05 ENCOUNTER — Ambulatory Visit: Admitting: Family Medicine

## 2024-11-05 ENCOUNTER — Encounter: Payer: Self-pay | Admitting: Family Medicine

## 2024-11-05 VITALS — BP 124/80 | HR 90 | Temp 98.0°F | Ht 61.0 in | Wt 168.8 lb

## 2024-11-05 DIAGNOSIS — N946 Dysmenorrhea, unspecified: Secondary | ICD-10-CM

## 2024-11-05 DIAGNOSIS — E559 Vitamin D deficiency, unspecified: Secondary | ICD-10-CM

## 2024-11-05 DIAGNOSIS — E611 Iron deficiency: Secondary | ICD-10-CM

## 2024-11-05 DIAGNOSIS — E669 Obesity, unspecified: Secondary | ICD-10-CM | POA: Diagnosis not present

## 2024-11-05 DIAGNOSIS — K219 Gastro-esophageal reflux disease without esophagitis: Secondary | ICD-10-CM

## 2024-11-05 DIAGNOSIS — I10 Essential (primary) hypertension: Secondary | ICD-10-CM

## 2024-11-05 DIAGNOSIS — F419 Anxiety disorder, unspecified: Secondary | ICD-10-CM | POA: Diagnosis not present

## 2024-11-05 MED ORDER — OMEPRAZOLE 40 MG PO CPDR: 40.0000 mg | DELAYED_RELEASE_CAPSULE | Freq: Every day | ORAL | 1 refills | Status: AC

## 2024-11-05 MED ORDER — AMLODIPINE BESYLATE 10 MG PO TABS: 10.0000 mg | ORAL_TABLET | Freq: Every day | ORAL | 1 refills | Status: AC

## 2024-11-05 NOTE — Patient Instructions (Addendum)

## 2024-11-05 NOTE — Progress Notes (Signed)
 "      Maria Goodwin , Oct 19, 1981, 43 y.o., female MRN: [id number] Patient Care Team    Relationship Specialty Notifications Start End  Catherine Charlies LABOR, DO PCP - General Family Medicine  02/22/20   Ginette Shasta NOVAK, NP Nurse Practitioner Obstetrics and Gynecology  06/04/23   Cheryn Nickels, MD Referring Physician Allergy and Immunology  12/11/23   Shila Gustav GAILS, MD Consulting Physician Gastroenterology  07/21/24     Chief Complaint  Patient presents with   Medical Management of Chronic Issues     Subjective: Maria Goodwin is a 43 y.o. Pt presents for an OV to follow-up on chronic condition management. Medication reconciliation completed today.  Past medical history updated where appropriate.  Iron  deficiency: Patient has started gentle iron  daily and is feeling improvement. Anxiousness: Patient reports since starting the Effexor  taper he has noticed a great improvement and is less anxious, overall feels better when she has noticed she is able to focus better.  Her husband also provided her with positive feedback concerning her mood, since starting the medication. Prior note: with complaints of brain fog and feeling generally anxious and not well.  She reports she is usually happy and energetic and is not and she is only feeling good for about 1 week, after her menstrual cycle.  Menstrual cycles have become more irregular over the last 12 months.  She states she started her menstrual cycle at age 43 and they were always around 28 days and heavy.  Over the last year her menstrual cycles occur about every 5 to 7 weeks. Patient reports she is sleeping well.  She currently does not have any hot flashes.  She had a couple night sweats a few months ago, but nothing since.  There may be some mild vaginal dryness, but intermittently per patient.  She has a history of vitamin D  deficiency.  Hypertension/E66.9:  Pt reports compliance with amlodipine  10 mg daily.  Patient denies chest pain,  shortness of breath, dizziness or lower extremity edema.  . Pt is not taking a daily baby ASA. Pt is not prescribed a prescribed statin. RF: Hypertension, obesity, family history of hypertension, hyperlipidemia and stroke  GERD: Patient reports compliance with Prilosec 40 mg daily.  Symptoms well-controlled on medications.  Unfortunately patient has not been able to taper off medications without reoccurrence of symptoms.     07/21/2024    9:58 AM 07/30/2023    8:21 AM 12/20/2022    3:26 PM 10/15/2021    2:37 PM 09/14/2020    8:01 AM  Depression screen PHQ 2/9  Decreased Interest 0 0 0 0 0  Down, Depressed, Hopeless 0 0 0 0 0  PHQ - 2 Score 0 0 0 0 0  Altered sleeping 0  0    Tired, decreased energy 1      Change in appetite 0      Feeling bad or failure about yourself  0      Trouble concentrating 1      Moving slowly or fidgety/restless 0      Suicidal thoughts 0      PHQ-9 Score 2   0     Difficult doing work/chores Not difficult at all         Data saved with a previous flowsheet row definition      07/21/2024    9:58 AM  GAD 7 : Generalized Anxiety Score  Nervous, Anxious, on Edge 0  Control/stop worrying 0  Worry too much -  different things 0  Trouble relaxing 0  Restless 0  Easily annoyed or irritable 0  Afraid - awful might happen 0  Total GAD 7 Score 0  Anxiety Difficulty Not difficult at all       04/16/2021    5:38 PM 12/20/2022    3:26 PM 07/30/2023    8:21 AM 07/21/2024    9:57 AM  Fall Risk  Falls in the past year?  0 1 1  Was there an injury with Fall?  0  0  0   Fall Risk Category Calculator  0 1 2  (RETIRED) Patient Fall Risk Level Low fall risk      Fall risk Follow up  Falls evaluation completed Falls evaluation completed Falls evaluation completed     Data saved with a previous flowsheet row definition    No Known Allergies Social History   Social History Narrative   Marital status/children/pets: Married, 1 child.   Education/employment: High  school graduate, stay-at-home mom   Safety:      -Wears a bicycle helmet riding a bike: Yes     -smoke alarm in the home:Yes     - wears seatbelt: Yes     - Feels safe in their relationships: Yes   Past Medical History:  Diagnosis Date   Allergic reaction 02/23/2020   Itchy mouth with watermelon and cucumber.  Refer to allergist.   Allergic rhinitis    Allergy    Anemia 2021   Anxiety Since childhood   Blood in stool 2010   Patient reports hospitalization for blood in stool, diarrhea and extreme dehydration.  She was hospitalized at this time and a colonoscopy was completed.   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   Frequent headaches    GERD (gastroesophageal reflux disease)    Heart murmur    Hypertension    Iron  deficiency anemia    Irregular menstrual bleeding 2015   Numbness and tingling of upper extremity 02/23/2020   Osteoarthritis of knee 2016   Other allergic rhinitis 03/23/2020   Patellofemoral pain syndrome 04/2015   Tinea corporis    Tonsil stone 2016   Vitamin D  deficiency    Past Surgical History:  Procedure Laterality Date   BREAST BIOPSY Left 08/2016   fibroadenoma   BREAST BIOPSY Left 01/15/2021   BREAST BIOPSY Left 2023   BREAST BIOPSY Left 04/16/2024   US  LT BREAST BX W LOC DEV 1ST LESION IMG BX SPEC US  GUIDE 04/16/2024 [facility] MAMMOGRAPHY   CESAREAN SECTION  2012   WISDOM TOOTH EXTRACTION     Family History  Problem Relation Age of Onset   Arthritis Mother    Hyperlipidemia Mother    Hypertension Mother    Learning disabilities Mother    Allergic rhinitis Mother    ALS Mother    Alcohol abuse Father    Depression Father    Vision loss Father    Asthma Brother    Hyperlipidemia Brother    Hypertension Brother    Allergic rhinitis Brother    Asthma Brother    Hyperlipidemia Brother    Hypertension Brother    Allergic rhinitis Brother    Bladder Cancer  Maternal Grandmother    Early death Maternal Grandmother    Skin cancer Maternal Grandfather    Early death Maternal Grandfather    Stroke Maternal Grandfather    Hypertension Maternal Grandfather    Skin  cancer Paternal Grandmother    Skin cancer Paternal Grandfather    Allergic rhinitis Son    Eczema Son    Urticaria Son    Breast cancer Neg Hx    Colon cancer Neg Hx    Colon polyps Neg Hx    Esophageal cancer Neg Hx    Rectal cancer Neg Hx    Stomach cancer Neg Hx    Allergies as of 11/05/2024   No Known Allergies      Medication List        Accurate as of November 05, 2024 10:56 AM. If you have any questions, ask your nurse or doctor.          albuterol 108 (90 Base) MCG/ACT inhaler Commonly known as: VENTOLIN HFA INHALE 2 PUFFS EVERY 4-6 HOURS AS NEEDED COUGH/WHEEZE for 30   ALLEGRA-D 24 HOUR PO Take by mouth.   amLODipine  10 MG tablet Commonly known as: NORVASC  Take 1 tablet (10 mg total) by mouth daily.   EPINEPHrine 0.3 mg/0.3 mL Soaj injection Commonly known as: EPI-PEN See admin instructions.   fluticasone  50 MCG/ACT nasal spray Commonly known as: FLONASE  Place 2 sprays into both nostrils in the morning and at bedtime.   MULTI COMPLETE PO Take by mouth.   naproxen  500 MG tablet Commonly known as: Naprosyn  Take 1 tablet (500 mg total) by mouth 2 (two) times daily as needed for mild pain (pain score 1-3).   omeprazole  40 MG capsule Commonly known as: PRILOSEC Take 1 capsule (40 mg total) by mouth daily.   venlafaxine  XR 75 MG 24 hr capsule Commonly known as: Effexor  XR Take 1 capsule (75 mg total) by mouth daily with breakfast. What changed: Another medication with the same name was removed. Continue taking this medication, and follow the directions you see here. Changed by: Charlies Bellini, DO        All past medical history, surgical history, allergies, family history, immunizations andmedications were updated in the EMR today and  reviewed under the history and medication portions of their EMR.     Review of Systems  Constitutional: Negative.   HENT: Negative.    Eyes: Negative.   Respiratory: Negative.    Cardiovascular: Negative.   Gastrointestinal: Negative.   Genitourinary: Negative.   Musculoskeletal: Negative.   Skin: Negative.   Neurological: Negative.   Endo/Heme/Allergies: Negative.   Psychiatric/Behavioral: Negative.    All other systems reviewed and are negative.  Negative, with the exception of above mentioned in HPI   Objective:  BP 124/80   Pulse 90   Temp 98 F (36.7 C)   Ht 5' 1 (1.549 m)   Wt 168 lb 12.8 oz (76.6 kg)   SpO2 98%   BMI 31.89 kg/m  Body mass index is 31.89 kg/m. Physical Exam Vitals and nursing note reviewed.  Constitutional:      General: She is not in acute distress.    Appearance: Normal appearance. She is normal weight. She is not ill-appearing or toxic-appearing.  HENT:     Head: Normocephalic and atraumatic.  Eyes:     General: No scleral icterus.       Right eye: No discharge.        Left eye: No discharge.     Extraocular Movements: Extraocular movements intact.     Conjunctiva/sclera: Conjunctivae normal.     Pupils: Pupils are equal, round, and reactive to light.  Cardiovascular:     Rate and Rhythm: Normal rate.  Skin:  Findings: No rash.  Neurological:     Mental Status: She is alert and oriented to person, place, and time. Mental status is at baseline.     Motor: No weakness.     Coordination: Coordination normal.     Gait: Gait normal.  Psychiatric:        Mood and Affect: Mood normal.        Behavior: Behavior normal.        Thought Content: Thought content normal.        Judgment: Judgment normal.      No results found. No results found. No results found for this or any previous visit (from the past 24 hours).  Assessment/Plan: Maria Goodwin is a 42 y.o. female present for OV for  Primary hypertension/E66.9 (BMI  30-39.9) Stable Low sodium-DASH diet recommended Routine exercise Continue amlodipine  10 mg QD  Labs due next visit  Vitamin D  deficiency: Continue Vitamin D  1000-2000 mcg daily.   Severe menstrual cramps/Menorrhagia with regular cycle Stable Continue naproxen  a few days prior to menstrual cycle and through bleeding pattern.  GERD Stable Continue omeprazole  40 mg daily She has been referred to gastroenterology earlier this year  Iron  deficiency: Patient has noticed an improvement in her symptoms since starting the gentle iron  daily. Serum iron  43, ferritin 8.2, saturation ratios 11.9 Labs due next visit for retesting  Anxiety:. Patient reports he feels much improved since starting the Effexor . Continue Effexor  75 mg daily   Reviewed expectations re: course of current medical issues. Discussed self-management of symptoms. Outlined signs and symptoms indicating need for more acute intervention. Patient verbalized understanding and all questions were answered. Patient received an After-Visit Summary.    Return if symptoms worsen or fail to improve.  No orders of the defined types were placed in this encounter.  Meds ordered this encounter  Medications   amLODipine  (NORVASC ) 10 MG tablet    Sig: Take 1 tablet (10 mg total) by mouth daily.    Dispense:  90 tablet    Refill:  1   omeprazole  (PRILOSEC) 40 MG capsule    Sig: Take 1 capsule (40 mg total) by mouth daily.    Dispense:  90 capsule    Refill:  1   Referral Orders  No referral(s) requested today     Note is dictated utilizing voice recognition software. Although note has been proof read prior to signing, occasional typographical errors still can be missed. If any questions arise, please do not hesitate to call for verification.   electronically signed by:  Charlies Bellini, DO  [facility] [place]    "

## 2024-12-17 ENCOUNTER — Encounter: Payer: Self-pay | Admitting: Family Medicine

## 2024-12-20 ENCOUNTER — Ambulatory Visit: Admitting: Family Medicine

## 2024-12-22 ENCOUNTER — Ambulatory Visit: Admitting: Family Medicine

## 2024-12-22 VITALS — BP 126/78 | HR 88 | Temp 98.1°F | Wt 170.0 lb

## 2024-12-22 DIAGNOSIS — S8990XA Unspecified injury of unspecified lower leg, initial encounter: Secondary | ICD-10-CM

## 2024-12-22 DIAGNOSIS — M1710 Unilateral primary osteoarthritis, unspecified knee: Secondary | ICD-10-CM

## 2024-12-22 DIAGNOSIS — T148XXA Other injury of unspecified body region, initial encounter: Secondary | ICD-10-CM

## 2024-12-22 DIAGNOSIS — S99919A Unspecified injury of unspecified ankle, initial encounter: Secondary | ICD-10-CM

## 2024-12-22 MED ORDER — MELOXICAM 15 MG PO TABS: 15.0000 mg | ORAL_TABLET | Freq: Every day | ORAL | 0 refills | Status: AC

## 2024-12-22 NOTE — Patient Instructions (Signed)

## 2024-12-22 NOTE — Progress Notes (Unsigned)
 "      Maria Goodwin , 08-Jun-1981, 44 y.o., female MRN: [id number] Patient Care Team    Relationship Specialty Notifications Start End  Catherine Charlies LABOR, DO PCP - General Family Medicine  02/22/20   Ginette Shasta NOVAK, NP Nurse Practitioner Obstetrics and Gynecology  06/04/23   Cheryn Nickels, MD Referring Physician Allergy and Immunology  12/11/23   Shila Gustav GAILS, MD Consulting Physician Gastroenterology  07/21/24     Chief Complaint  Patient presents with   Ankle Injury    Pt fell in December causing right ankle and right knee pain. Denies swelling.      Subjective: Maria Goodwin is a 44 y.o. Pt presents for an OV with complaints of right ankle and knee pain after injury of 8 weeks duration.  Associated symptoms include ***.  Pt has tried *** to ease their symptoms.      12/22/2024    2:41 PM 07/21/2024    9:58 AM 07/30/2023    8:21 AM 12/20/2022    3:26 PM 10/15/2021    2:37 PM  Depression screen PHQ 2/9  Decreased Interest 0 0 0 0 0  Down, Depressed, Hopeless 0 0 0 0 0  PHQ - 2 Score 0 0 0 0 0  Altered sleeping 0 0  0   Tired, decreased energy 0 1     Change in appetite 0 0     Feeling bad or failure about yourself  0 0     Trouble concentrating 0 1     Moving slowly or fidgety/restless 0 0     Suicidal thoughts 0 0     PHQ-9 Score 0 2   0    Difficult doing work/chores Not difficult at all Not difficult at all        Data saved with a previous flowsheet row definition    Allergies[1] Social History   Social History Narrative   Marital status/children/pets: Married, 1 child.   Education/employment: High school graduate, stay-at-home mom   Safety:      -Wears a bicycle helmet riding a bike: Yes     -smoke alarm in the home:Yes     - wears seatbelt: Yes     - Feels safe in their relationships: Yes   Past Medical History:  Diagnosis Date   Allergic reaction 02/23/2020   Itchy mouth with watermelon and cucumber.  Refer to allergist.   Allergic rhinitis    Allergy     Anemia 2021   Anxiety Since childhood   Blood in stool 2010   Patient reports hospitalization for blood in stool, diarrhea and extreme dehydration.  She was hospitalized at this time and a colonoscopy was completed.   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   Frequent headaches    GERD (gastroesophageal reflux disease)    Heart murmur    Hypertension    Iron  deficiency anemia    Irregular menstrual bleeding 2015   Numbness and tingling of upper extremity 02/23/2020   Osteoarthritis of knee 2016   Other allergic rhinitis 03/23/2020   Patellofemoral pain syndrome 04/2015   Tinea corporis    Tonsil stone 2016   Vitamin D  deficiency    Past Surgical History:  Procedure Laterality Date   BREAST BIOPSY Left 08/2016   fibroadenoma   BREAST BIOPSY Left 01/15/2021   BREAST BIOPSY Left 2023   BREAST BIOPSY Left 04/16/2024   US   LT BREAST BX W LOC DEV 1ST LESION IMG BX SPEC US  GUIDE 04/16/2024 [facility] MAMMOGR[facility]HY   CESAREAN SECTION  2012   WISDOM TOOTH EXTRACTION     Family History  Problem Relation Age of Onset   Arthritis Mother    Hyperlipidemia Mother    Hypertension Mother    Learning disabilities Mother    Allergic rhinitis Mother    ALS Mother    Alcohol abuse Father    Depression Father    Vision loss Father    Asthma Brother    Hyperlipidemia Brother    Hypertension Brother    Allergic rhinitis Brother    Asthma Brother    Hyperlipidemia Brother    Hypertension Brother    Allergic rhinitis Brother    Bladder Cancer Maternal Grandmother    Early death Maternal Grandmother    Skin cancer Maternal Grandfather    Early death Maternal Grandfather    Stroke Maternal Grandfather    Hypertension Maternal Grandfather    Skin cancer Paternal Grandmother    Skin cancer Paternal Grandfather    Allergic rhinitis Son    Eczema Son    Urticaria Son    Breast cancer Neg Hx    Colon cancer  Neg Hx    Colon polyps Neg Hx    Esophageal cancer Neg Hx    Rectal cancer Neg Hx    Stomach cancer Neg Hx    Allergies as of 12/22/2024   No Known Allergies      Medication List        Accurate as of December 22, 2024  2:45 PM. If you have any questions, ask your nurse or doctor.          albuterol 108 (90 Base) MCG/ACT inhaler Commonly known as: VENTOLIN HFA INHALE 2 PUFFS EVERY 4-6 HOURS AS NEEDED COUGH/WHEEZE for 30   ALLEGRA-D 24 HOUR PO Take by mouth.   amLODipine  10 MG tablet Commonly known as: NORVASC  Take 1 tablet (10 mg total) by mouth daily.   EPINEPHrine 0.3 mg/0.3 mL Soaj injection Commonly known as: EPI-PEN See admin instructions.   fluticasone  50 MCG/ACT nasal spray Commonly known as: FLONASE  Place 2 sprays into both nostrils in the morning and at bedtime.   MULTI COMPLETE PO Take by mouth.   naproxen  500 MG tablet Commonly known as: Naprosyn  Take 1 tablet (500 mg total) by mouth 2 (two) times daily as needed for mild pain (pain score 1-3).   omeprazole  40 MG capsule Commonly known as: PRILOSEC Take 1 capsule (40 mg total) by mouth daily.   venlafaxine  XR 75 MG 24 hr capsule Commonly known as: Effexor  XR Take 1 capsule (75 mg total) by mouth daily with breakfast.        All past medical history, surgical history, allergies, family history, immunizations andmedications were updated in the EMR today and reviewed under the history and medication portions of their EMR.     ROS Negative, with the exception of above mentioned in HPI   Objective:  BP 126/78   Pulse 88   Temp 98.1 F (36.7 C)   Wt 170 lb (77.1 kg)   SpO2 98%   BMI 32.12 kg/m  Body mass index is 32.12 kg/m.  Physical Exam Vitals and nursing note reviewed.  Constitutional:      General: She is not in acute distress.    Appearance: Normal appearance. She is normal weight. She is not ill-appearing or toxic-appearing.  HENT:     Head:  Normocephalic and atraumatic.   Eyes:     General: No scleral icterus.       Right eye: No discharge.        Left eye: No discharge.     Extraocular Movements: Extraocular movements intact.     Conjunctiva/sclera: Conjunctivae normal.     Pupils: Pupils are equal, round, and reactive to light.  Skin:    Findings: No rash.  Neurological:     Mental Status: She is alert and oriented to person, place, and time. Mental status is at baseline.     Motor: No weakness.     Coordination: Coordination normal.     Gait: Gait normal.  Psychiatric:        Mood and Affect: Mood normal.        Behavior: Behavior normal.        Thought Content: Thought content normal.        Judgment: Judgment normal.    ***  No results found. No results found. No results found for this or any previous visit (from the past 24 hours).  Assessment/Plan: Imagine Nest is a 44 y.o. female present for OV for  *** Reviewed expectations re: course of current medical issues. Discussed self-management of symptoms. Outlined signs and symptoms indicating need for more acute intervention. Patient verbalized understanding and all questions were answered. Patient received an After-Visit Summary.    No orders of the defined types were placed in this encounter.  No orders of the defined types were placed in this encounter.  Referral Orders  No referral(s) requested today     Note is dictated utilizing voice recognition software. Although note has been proof read prior to signing, occasional typographical errors still can be missed. If any questions arise, please do not hesitate to call for verification.   electronically signed by:  Charlies Bellini, DO  [facility] [place]       [1] No Known Allergies  "

## 2024-12-23 ENCOUNTER — Encounter: Payer: Self-pay | Admitting: Family Medicine

## 2025-01-14 ENCOUNTER — Encounter: Admitting: Family Medicine

## 2025-01-21 ENCOUNTER — Encounter: Admitting: Family Medicine
